# Patient Record
Sex: Male | Born: 1935 | Race: White | Hispanic: No | Marital: Married | Smoking: Never smoker
Health system: Southern US, Community
[De-identification: ages and names within clinical notes are randomized; demographics above are authoritative.]

## PROBLEM LIST (undated history)

## (undated) DIAGNOSIS — C189 Malignant neoplasm of colon, unspecified: Secondary | ICD-10-CM

## (undated) DIAGNOSIS — I2510 Atherosclerotic heart disease of native coronary artery without angina pectoris: Secondary | ICD-10-CM

## (undated) DIAGNOSIS — E785 Hyperlipidemia, unspecified: Secondary | ICD-10-CM

## (undated) DIAGNOSIS — Z9989 Dependence on other enabling machines and devices: Secondary | ICD-10-CM

## (undated) DIAGNOSIS — G4733 Obstructive sleep apnea (adult) (pediatric): Secondary | ICD-10-CM

## (undated) DIAGNOSIS — Z8679 Personal history of other diseases of the circulatory system: Secondary | ICD-10-CM

## (undated) HISTORY — PX: BLADDER SURGERY: SHX569

## (undated) HISTORY — DX: Malignant neoplasm of colon, unspecified: C18.9

## (undated) HISTORY — DX: Personal history of other diseases of the circulatory system: Z86.79

## (undated) HISTORY — DX: Dependence on other enabling machines and devices: Z99.89

## (undated) HISTORY — PX: PROSTATE SURGERY: SHX751

## (undated) HISTORY — PX: APPENDECTOMY: SHX54

## (undated) HISTORY — DX: Atherosclerotic heart disease of native coronary artery without angina pectoris: I25.10

## (undated) HISTORY — DX: Obstructive sleep apnea (adult) (pediatric): G47.33

## (undated) HISTORY — PX: SUBTOTAL COLECTOMY: SHX855

## (undated) HISTORY — DX: Hyperlipidemia, unspecified: E78.5

---

## 1985-05-29 DIAGNOSIS — C189 Malignant neoplasm of colon, unspecified: Secondary | ICD-10-CM

## 1985-05-29 HISTORY — DX: Malignant neoplasm of colon, unspecified: C18.9

## 1998-09-08 ENCOUNTER — Ambulatory Visit (HOSPITAL_COMMUNITY): Admission: RE | Admit: 1998-09-08 | Discharge: 1998-09-08 | Payer: Self-pay | Admitting: Gastroenterology

## 1998-11-04 ENCOUNTER — Encounter: Payer: Self-pay | Admitting: Surgery

## 1998-11-17 ENCOUNTER — Ambulatory Visit (HOSPITAL_COMMUNITY): Admission: RE | Admit: 1998-11-17 | Discharge: 1998-11-17 | Payer: Self-pay | Admitting: Surgery

## 1998-11-17 ENCOUNTER — Encounter (INDEPENDENT_AMBULATORY_CARE_PROVIDER_SITE_OTHER): Payer: Self-pay | Admitting: Specialist

## 1999-04-07 ENCOUNTER — Encounter: Admission: RE | Admit: 1999-04-07 | Discharge: 1999-05-02 | Payer: Self-pay | Admitting: Orthopedic Surgery

## 1999-11-15 ENCOUNTER — Encounter: Payer: Self-pay | Admitting: Endocrinology

## 1999-11-15 ENCOUNTER — Inpatient Hospital Stay (HOSPITAL_COMMUNITY): Admission: EM | Admit: 1999-11-15 | Discharge: 1999-11-17 | Payer: Self-pay

## 2000-09-27 ENCOUNTER — Encounter: Payer: Self-pay | Admitting: Emergency Medicine

## 2000-09-27 ENCOUNTER — Inpatient Hospital Stay (HOSPITAL_COMMUNITY): Admission: EM | Admit: 2000-09-27 | Discharge: 2000-10-02 | Payer: Self-pay | Admitting: Emergency Medicine

## 2000-09-28 ENCOUNTER — Encounter: Payer: Self-pay | Admitting: Internal Medicine

## 2000-10-01 ENCOUNTER — Encounter: Payer: Self-pay | Admitting: Surgery

## 2002-05-29 HISTORY — PX: KNEE SURGERY: SHX244

## 2004-12-20 ENCOUNTER — Ambulatory Visit: Payer: Self-pay | Admitting: Internal Medicine

## 2005-04-27 ENCOUNTER — Inpatient Hospital Stay (HOSPITAL_COMMUNITY): Admission: EM | Admit: 2005-04-27 | Discharge: 2005-05-03 | Payer: Self-pay | Admitting: Emergency Medicine

## 2005-08-07 HISTORY — PX: CARDIAC CATHETERIZATION: SHX172

## 2005-08-15 ENCOUNTER — Ambulatory Visit (HOSPITAL_COMMUNITY): Admission: RE | Admit: 2005-08-15 | Discharge: 2005-08-16 | Payer: Self-pay | Admitting: Cardiovascular Disease

## 2005-08-15 HISTORY — PX: CORONARY ANGIOPLASTY WITH STENT PLACEMENT: SHX49

## 2005-10-01 ENCOUNTER — Emergency Department (HOSPITAL_COMMUNITY): Admission: EM | Admit: 2005-10-01 | Discharge: 2005-10-01 | Payer: Self-pay | Admitting: Family Medicine

## 2006-04-17 ENCOUNTER — Ambulatory Visit: Payer: Self-pay | Admitting: Internal Medicine

## 2006-05-16 ENCOUNTER — Ambulatory Visit (HOSPITAL_COMMUNITY): Admission: RE | Admit: 2006-05-16 | Discharge: 2006-05-16 | Payer: Self-pay | Admitting: Gastroenterology

## 2006-06-05 ENCOUNTER — Ambulatory Visit: Payer: Self-pay | Admitting: Internal Medicine

## 2006-10-08 IMAGING — CT CT ABDOMEN W/ CM
1 series · 14 of 32 positions shown, 18 images · IV contrast (omnipaque)
Comparison: 04/27/05.
ABDOMEN CT WITH CONTRAST:

CLINICAL DATA: Small bowel obstruction.
TECHNIQUE: Multidetector CT imaging of the abdomen was performed following the standard protocol during bolus administration of intravenous contrast.
Contrast:  125 cc Omnipaque 300.
TECHNIQUE: Multidetector CT imaging of the pelvis was performed following the standard protocol during bolus administration of intravenous contrast.

[Series 2: abd_pel 5.0 b40f st · axial · 0.72mm/px · z∈[-339,-59]mm · 14 of 64 slices shown, 18 images]
[im 5/64  soft-tissue]
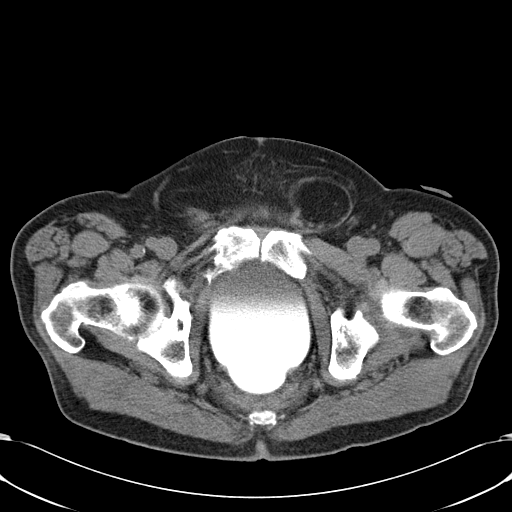
[im 5/64  bone]
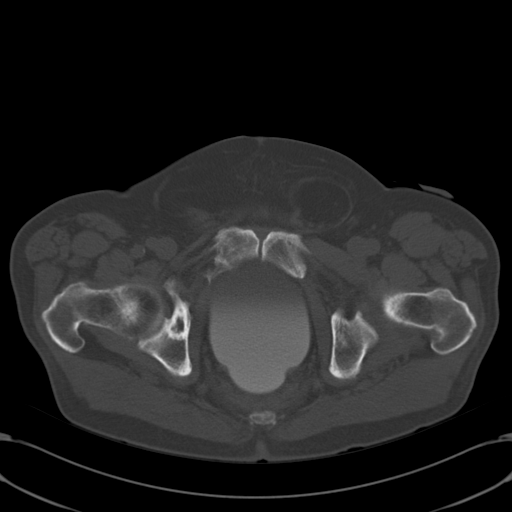
[im 9/64  soft-tissue]
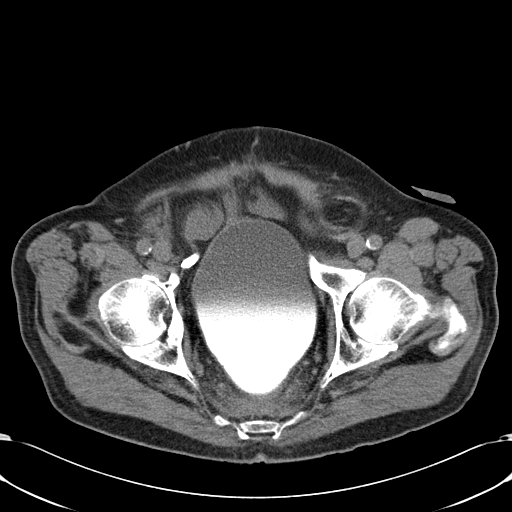
[im 15/64  soft-tissue]
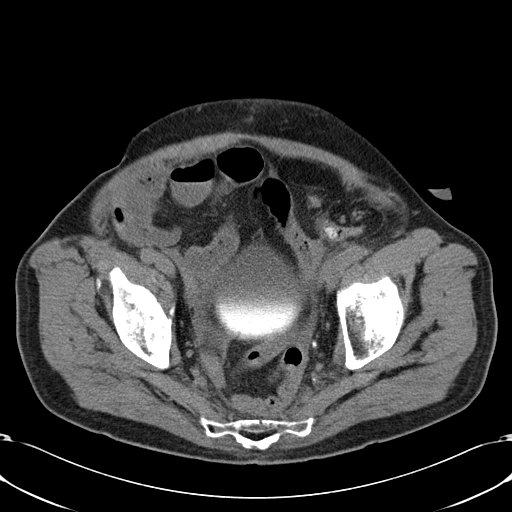
[im 19/64  soft-tissue]
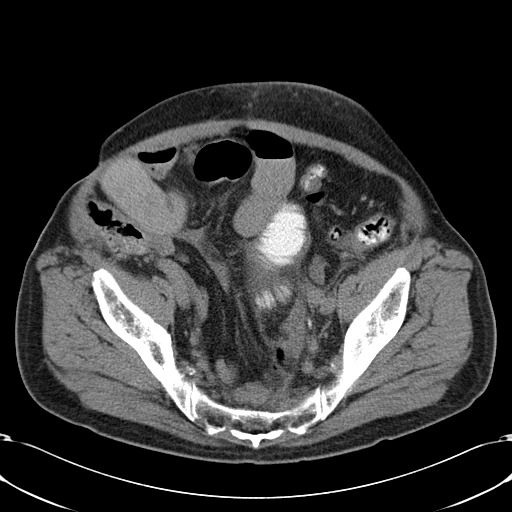
[im 25/64  soft-tissue]
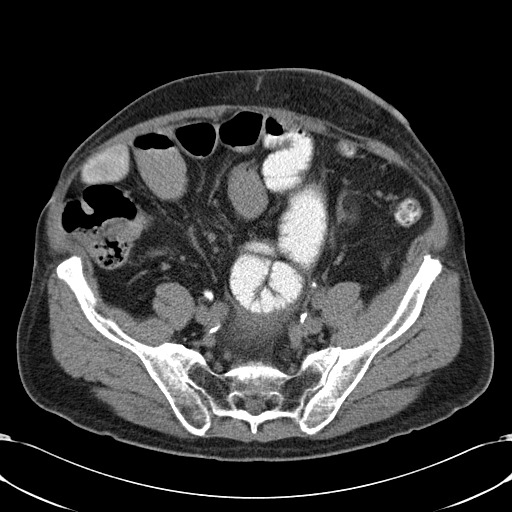
[im 29/64  soft-tissue]
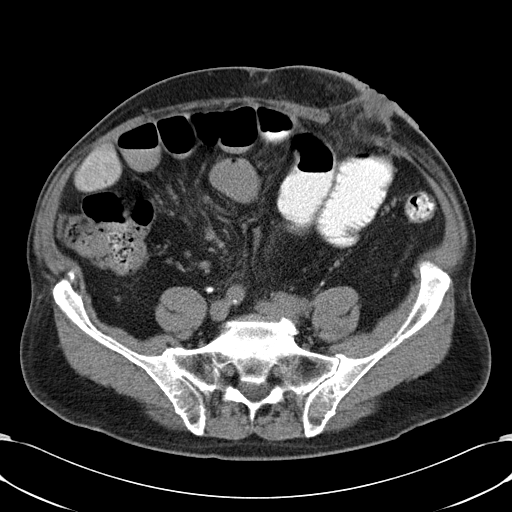
[im 35/64  soft-tissue]
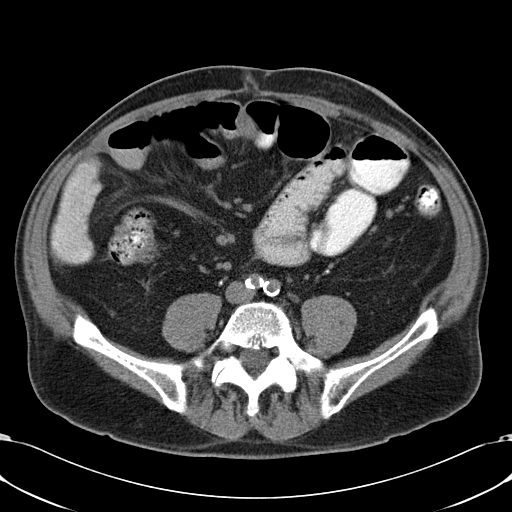
[im 39/64  soft-tissue]
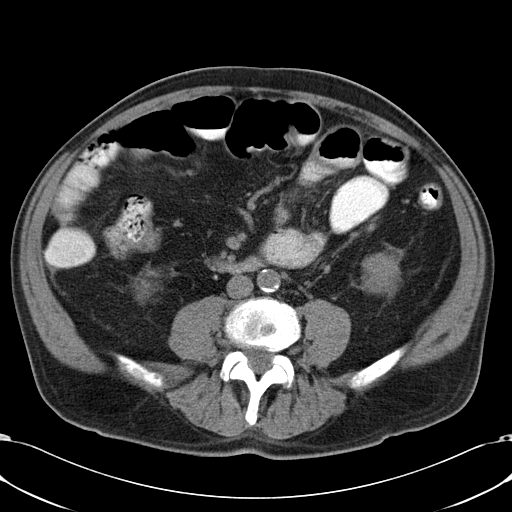
[im 45/64  soft-tissue]
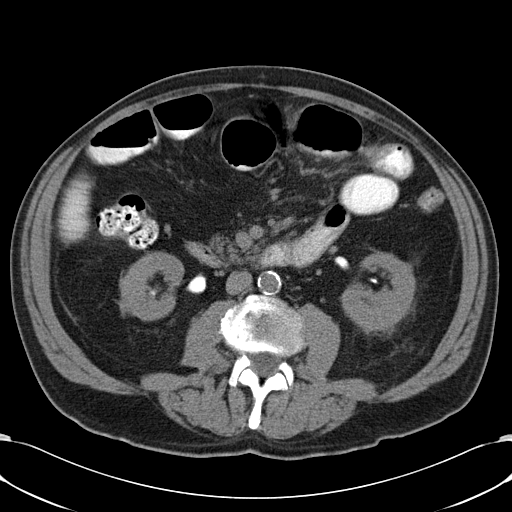
[im 45/64  bone]
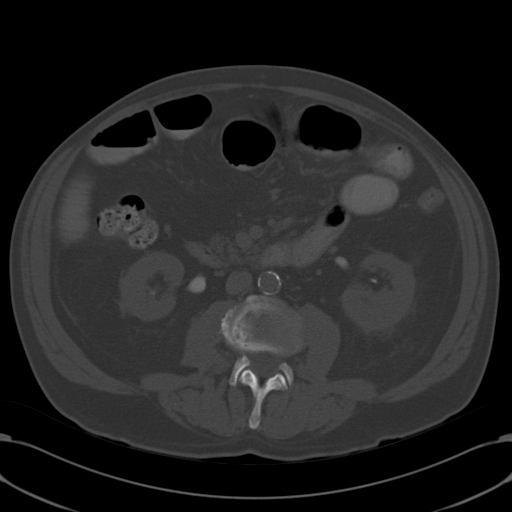
[im 49/64  soft-tissue]
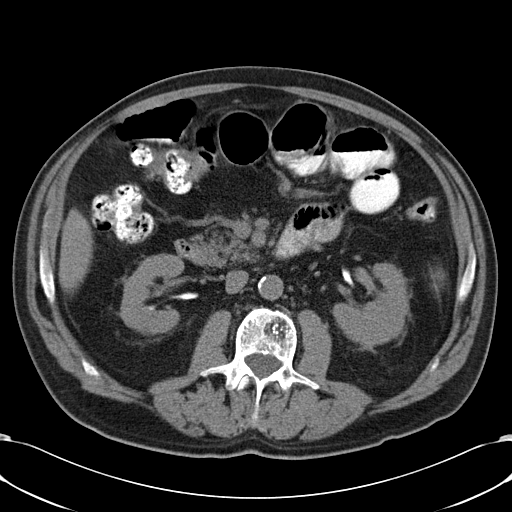
[im 55/64  soft-tissue]
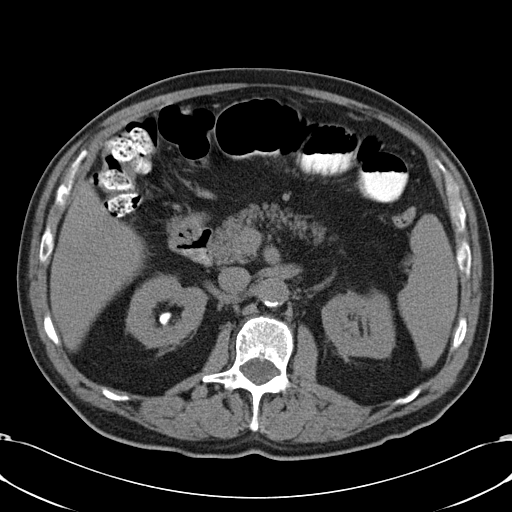
[im 55/64  lung]
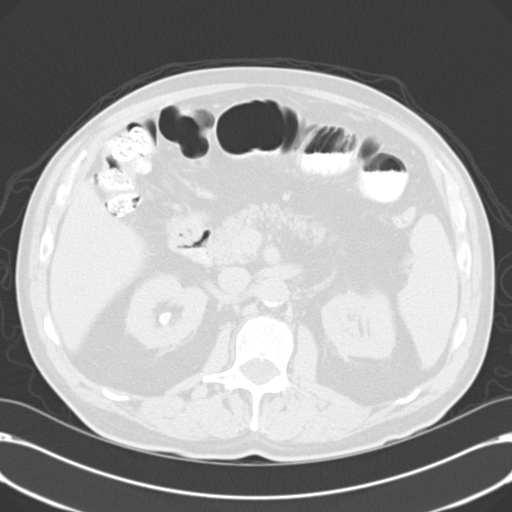
[im 57/64  lung]
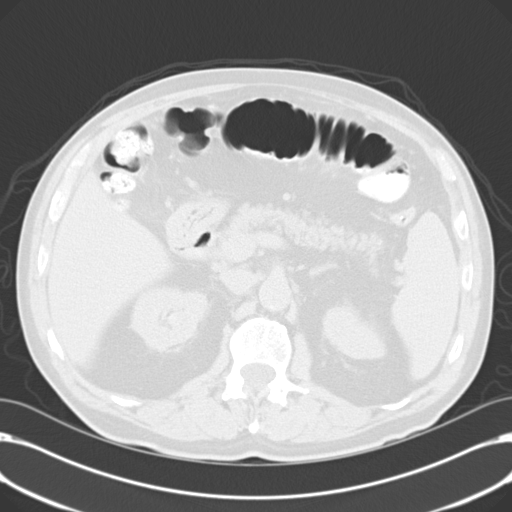
[im 59/64  soft-tissue]
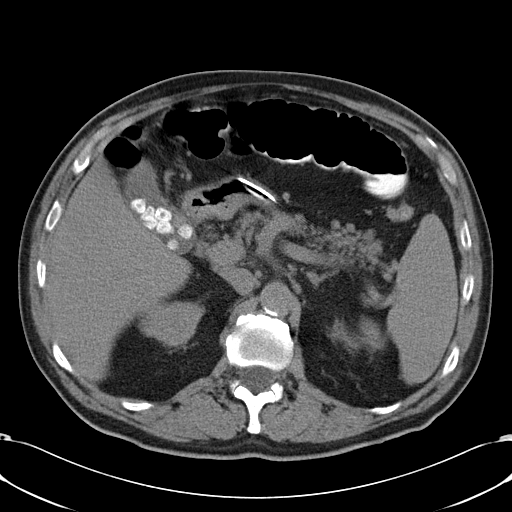
[im 59/64  lung]
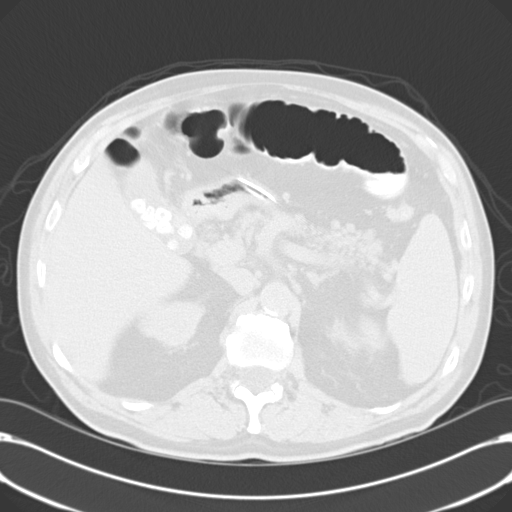
[im 61/64  lung]
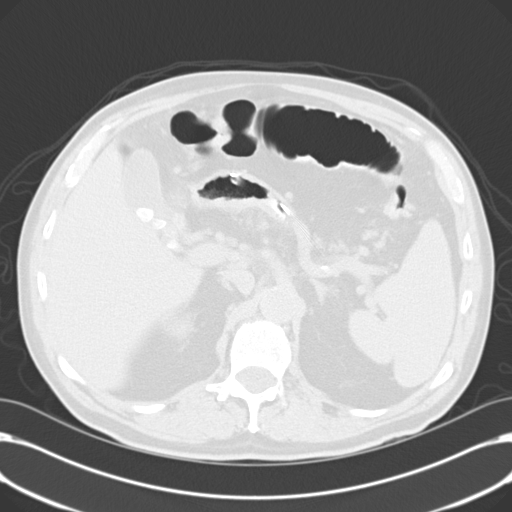

[14 of 32 positions shown; findings below may reference images not displayed]

FINDINGS: No focal abnormality is seen in the liver or spleen.  NG tube is looped in the stomach.  Duodenum, pancreas, and adrenal glands are unremarkable.  Multiple stones are noted in the gallbladder lumen.  Scarring is noted of both kidneys, which are otherwise unremarkable.  No free fluid, lymphadenopathy, or abdominal aortic aneurysm.
Although the duodenum is not distended, proximal jejunal loops are dilated up to 4.4 cm in diameter.
IMPRESSION: Dilated proximal small bowel loops.
PELVIS CT WITH CONTRAST:
FINDINGS: Imaging through the anatomic pelvis reveals interloop mesenteric fluid, with fluid layering dependently in the posterior peritoneal cavity.  The distal ileum shows diffuse wall thickening, and comparing back to the study from 5 days ago, this is the same segment of small bowel which demonstrated small bowel feces sign at that time.  This is an approximately 30 cm loop of small bowel positioned around 20 cm proximal to the ileocecal valve.  No discrete transition zone is identified although there is gradual tapering of the ileum through the involved segment with near complete collapse of the distal and terminal ileum. 
The patient has a sigmoid colostomy and contrast in the distal colon is likely from the CT scan of several days ago.  Left inguinal hernia contains fat as before.  There is deformity of the left superior pubic ramus with chronic features.
IMPRESSION: Jejunal loops are dilated proximally although small bowel becomes less distended towards the distal jejunum and proximal ileum.  There is a 20 to 30 cm segment of distal ileum (approximately 20 cm proximal to the ileocecal valve) which shows mild diffuse wall thickening and gradual tapering towards the non-dilated terminal ileum.  This is the same segment of bowel which demonstrated a small bowel feces sign on the previous study.  Likely, these features reflect an area of enteritis, which may be infectious or inflammatory or even ischemic.  Of note, the celiac axis, SMA, and IMA all opacify normally at their origins.  The affected small bowel segment may be causing a degree of functional obstruction given the dilated proximal jejunal loops.  The patient will be returned to the CT scanner after several hours to assess for transit of oral contrast into and possibly through the inflamed small bowel segment in hopes of definitively excluding a complete obstruction.

## 2006-10-09 IMAGING — CR DG ABDOMEN 2V
2 series · 2 of 2 positions shown · non-contrast
Comparison: CT on 04/30/05.

CLINICAL DATA: Patient had small bowel obstruction.  
 ABDOMEN - 2 VIEW:

[w abdomen upright]
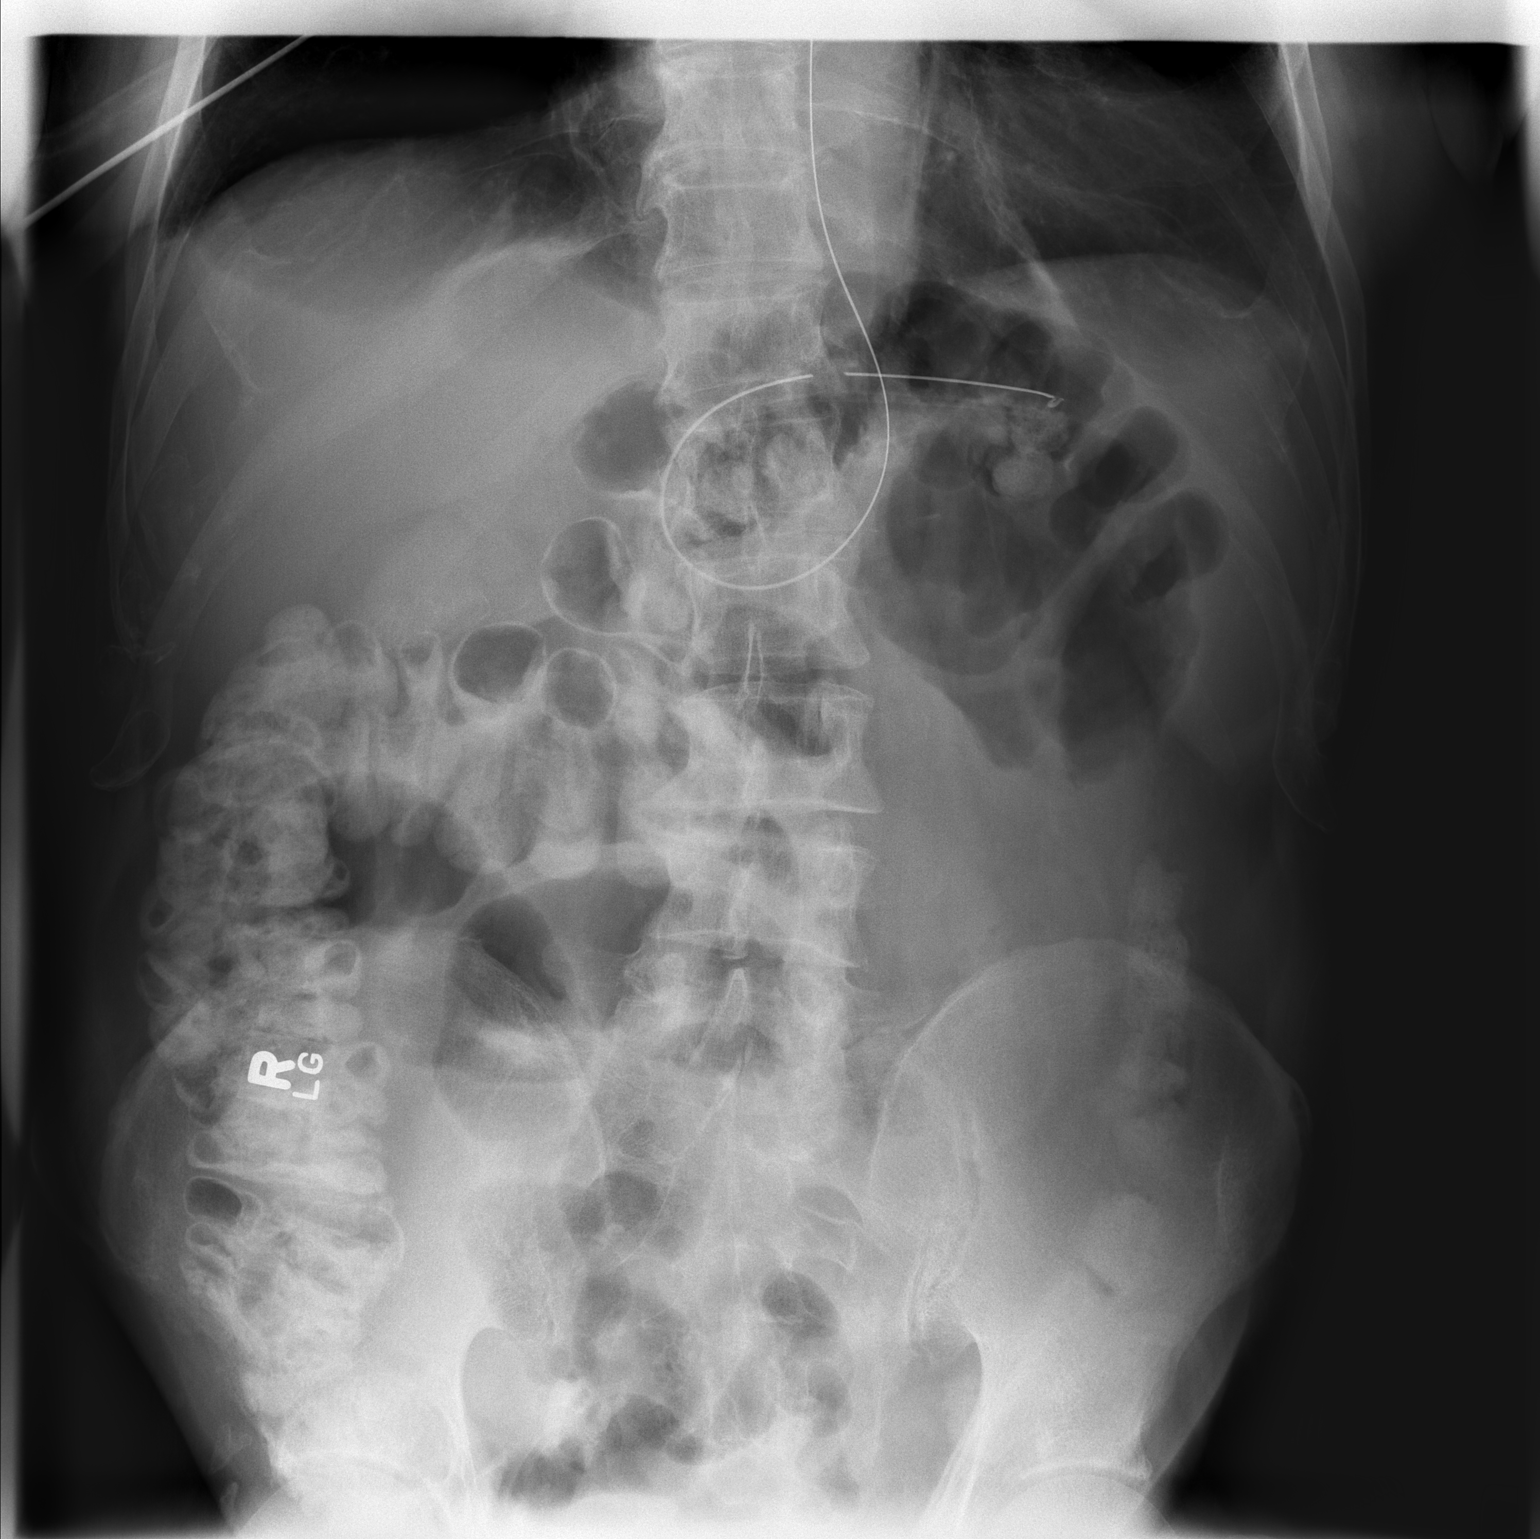

[t abdomen supine]
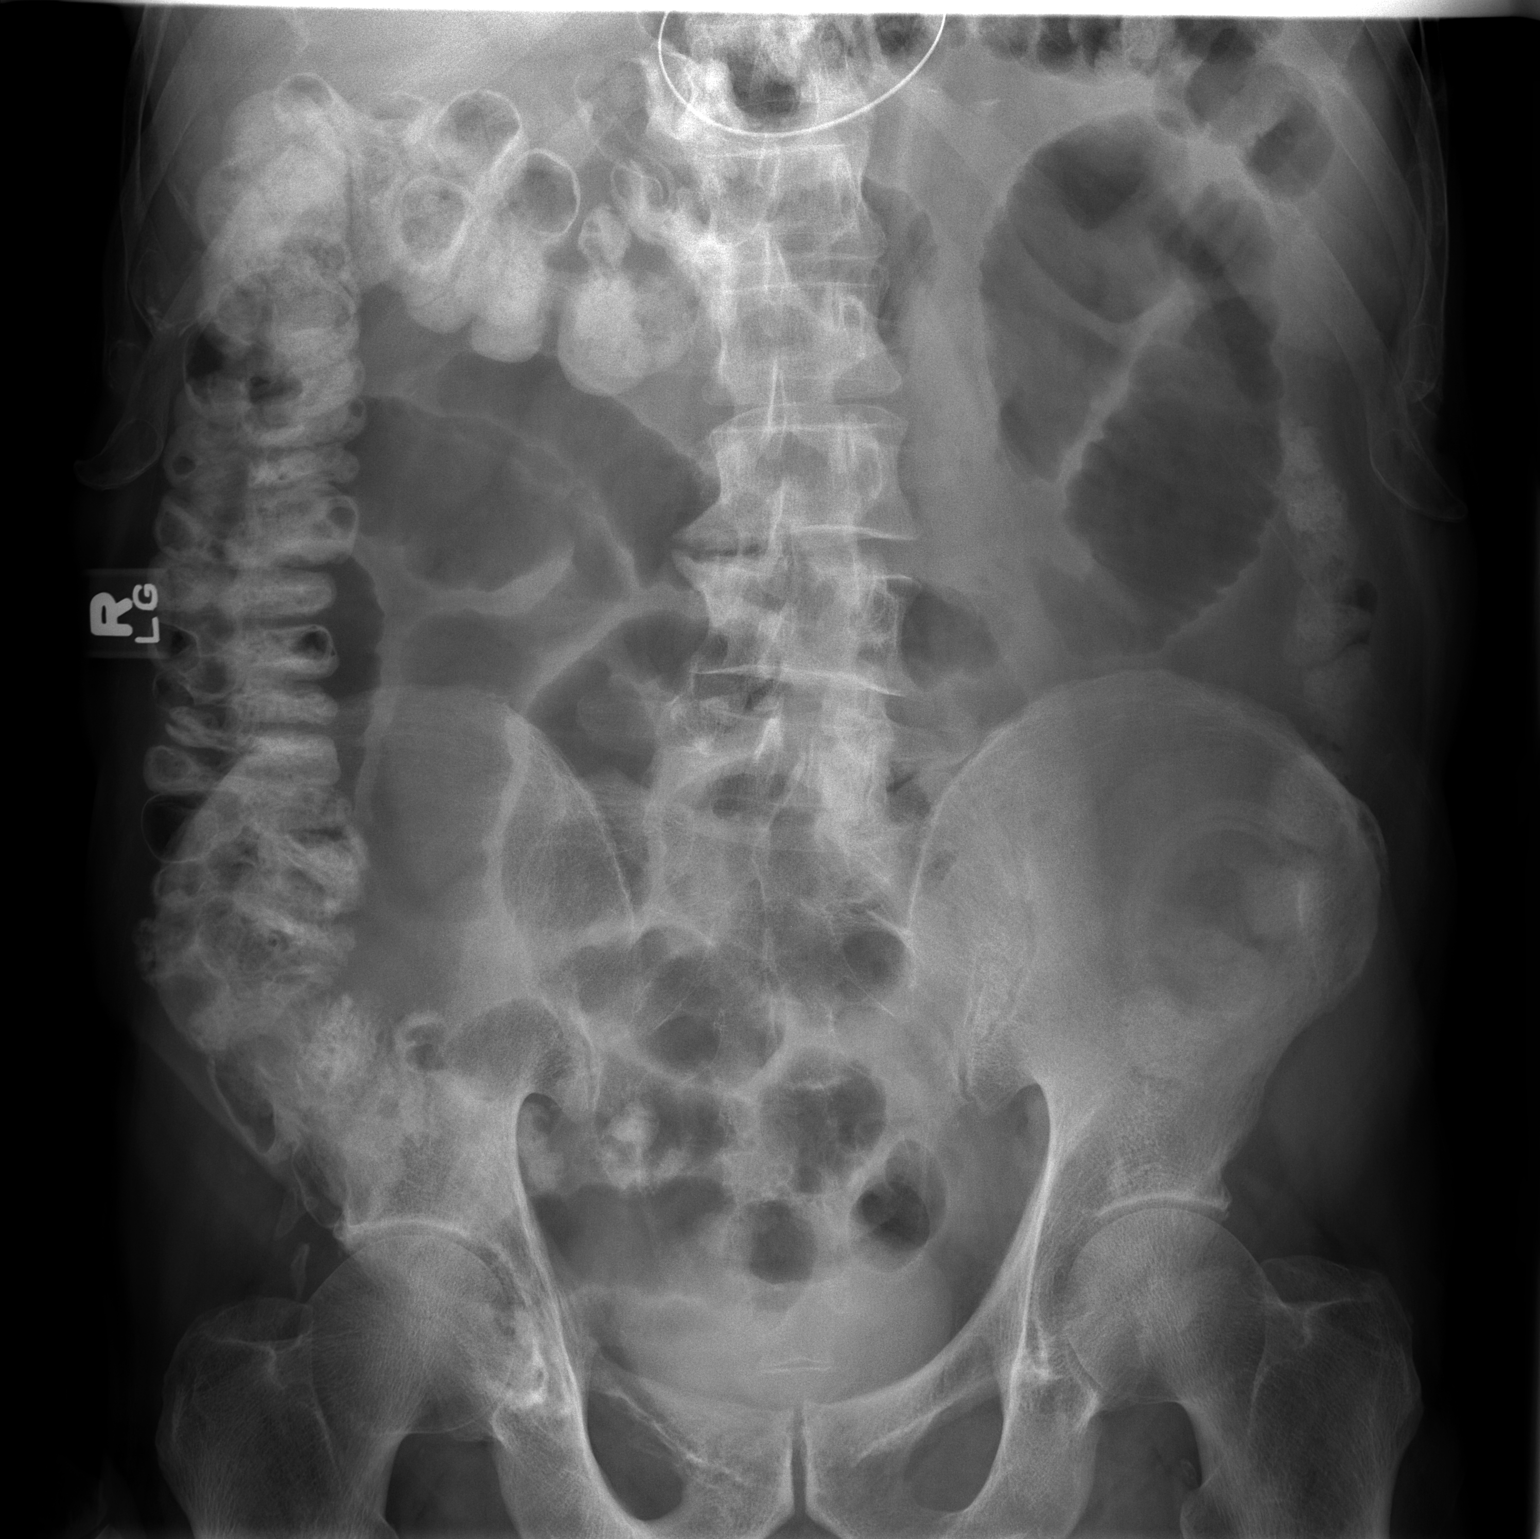

[2 of 2 positions shown; findings below may reference images not displayed]

FINDINGS: Supine and erect views reveal the nasogastric tube to be coiled in the stomach.  There is moderate gaseous dilatation of the small bowel loops.  Contrast is noted in the colon.  Findings are again compatible with partial small bowel obstruction.

## 2007-06-24 ENCOUNTER — Ambulatory Visit: Payer: Self-pay | Admitting: Internal Medicine

## 2007-06-27 ENCOUNTER — Encounter: Payer: Self-pay | Admitting: Internal Medicine

## 2007-06-27 DIAGNOSIS — I2510 Atherosclerotic heart disease of native coronary artery without angina pectoris: Secondary | ICD-10-CM

## 2007-06-27 DIAGNOSIS — Z9861 Coronary angioplasty status: Secondary | ICD-10-CM | POA: Insufficient documentation

## 2007-06-27 DIAGNOSIS — Z85038 Personal history of other malignant neoplasm of large intestine: Secondary | ICD-10-CM | POA: Insufficient documentation

## 2007-06-27 HISTORY — DX: Atherosclerotic heart disease of native coronary artery without angina pectoris: Z98.61

## 2007-06-27 HISTORY — DX: Atherosclerotic heart disease of native coronary artery without angina pectoris: I25.10

## 2007-07-17 ENCOUNTER — Inpatient Hospital Stay (HOSPITAL_COMMUNITY): Admission: EM | Admit: 2007-07-17 | Discharge: 2007-07-23 | Payer: Self-pay | Admitting: Emergency Medicine

## 2007-07-22 ENCOUNTER — Encounter (INDEPENDENT_AMBULATORY_CARE_PROVIDER_SITE_OTHER): Payer: Self-pay | Admitting: Surgery

## 2007-07-22 ENCOUNTER — Ambulatory Visit: Payer: Self-pay | Admitting: Internal Medicine

## 2008-02-05 ENCOUNTER — Ambulatory Visit (HOSPITAL_BASED_OUTPATIENT_CLINIC_OR_DEPARTMENT_OTHER): Admission: RE | Admit: 2008-02-05 | Discharge: 2008-02-05 | Payer: Self-pay | Admitting: Orthopedic Surgery

## 2008-02-26 ENCOUNTER — Encounter: Admission: RE | Admit: 2008-02-26 | Discharge: 2008-03-24 | Payer: Self-pay | Admitting: Orthopedic Surgery

## 2008-12-29 IMAGING — RF DG CHOLANGIOGRAM OPERATIVE
1 series · 9 of 9 positions shown · non-contrast
Comparison: none

CLINICAL DATA: Laparoscopic cholecystectomy. 
 OPERATIVE CHOLANGIOGRAM:

[Series 1: run · 3 acquisitions, 9 frames shown]
[im 1/3]
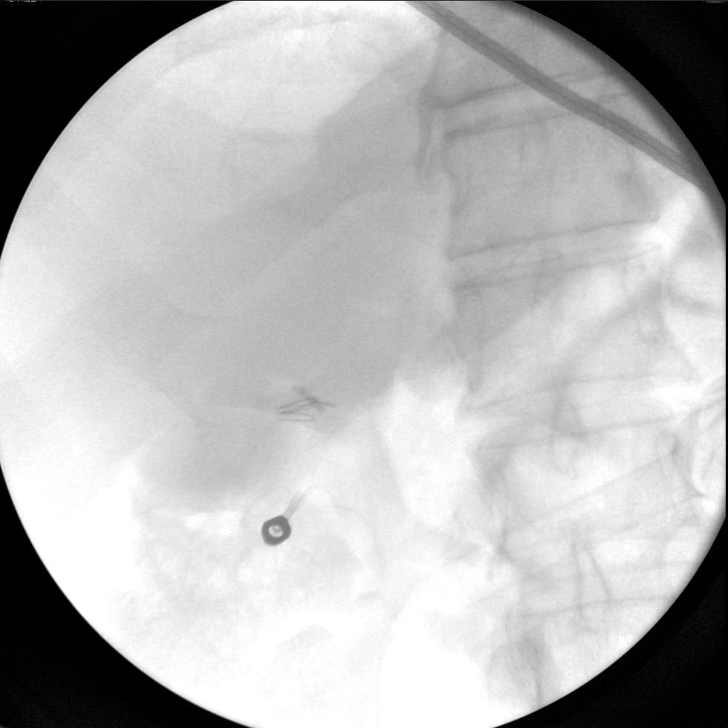
[im 1/3]
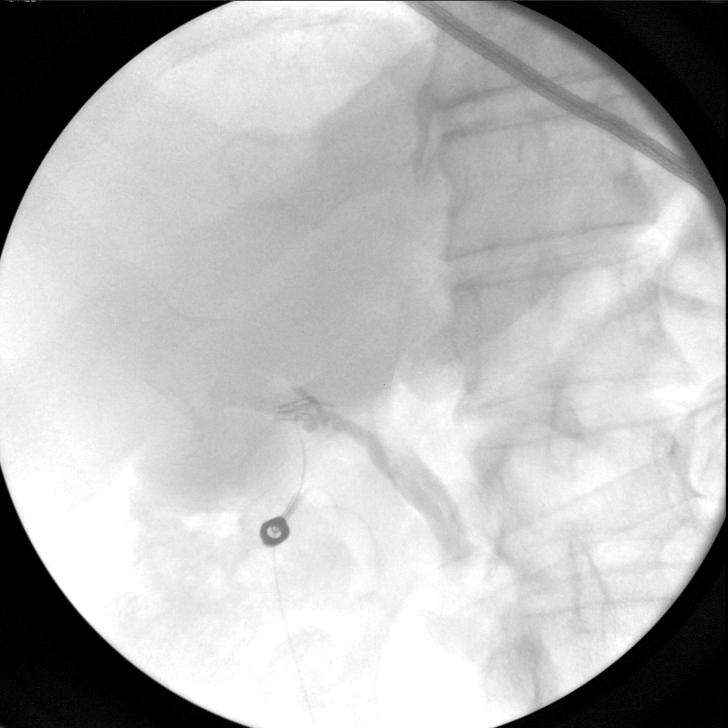
[im 1/3]
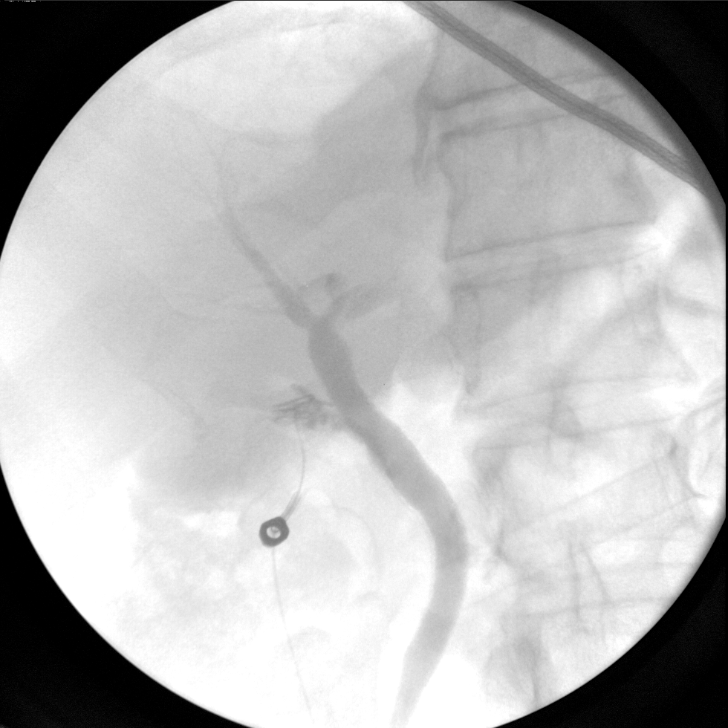
[im 1/3]
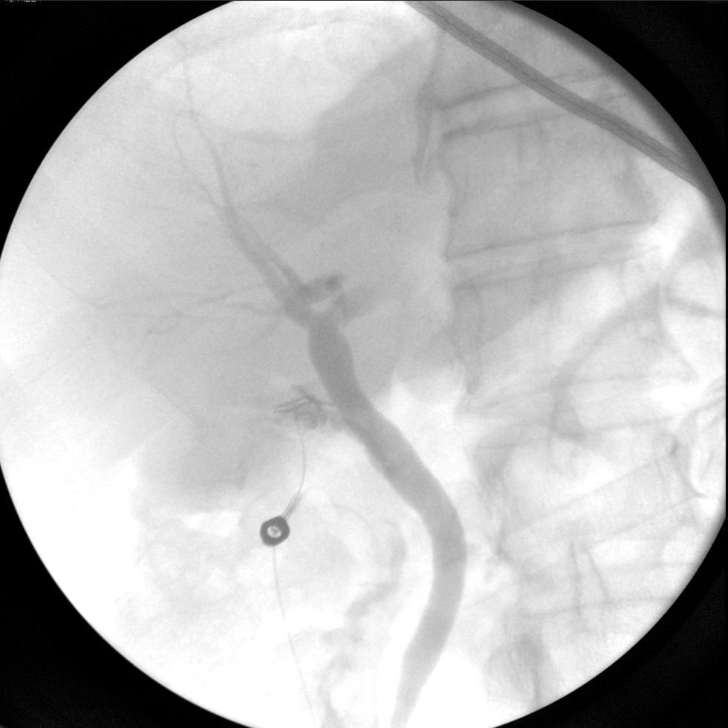
[im 2/3]
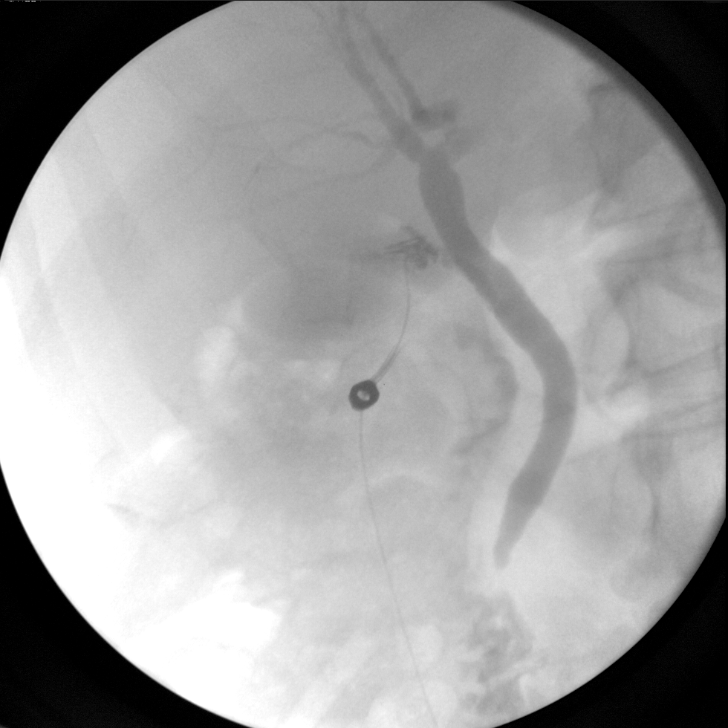
[im 2/3]
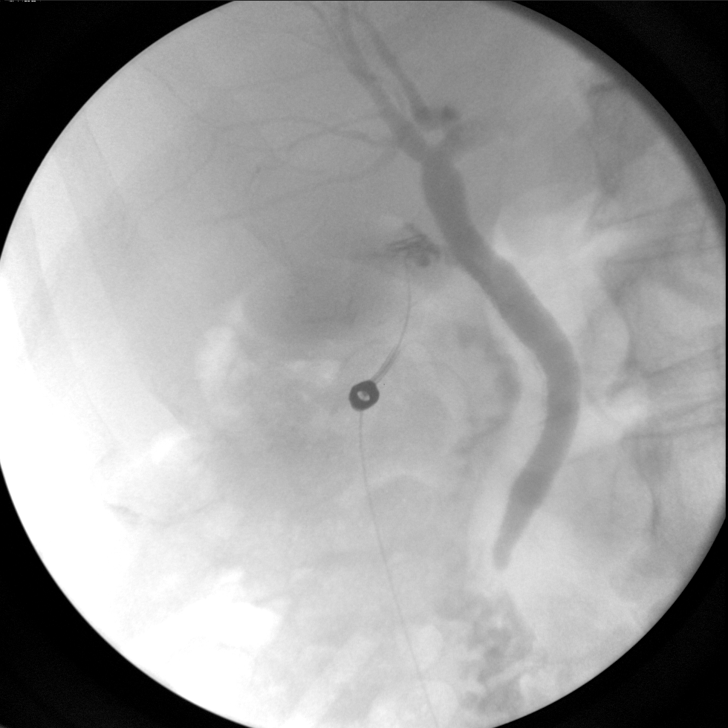
[im 2/3]
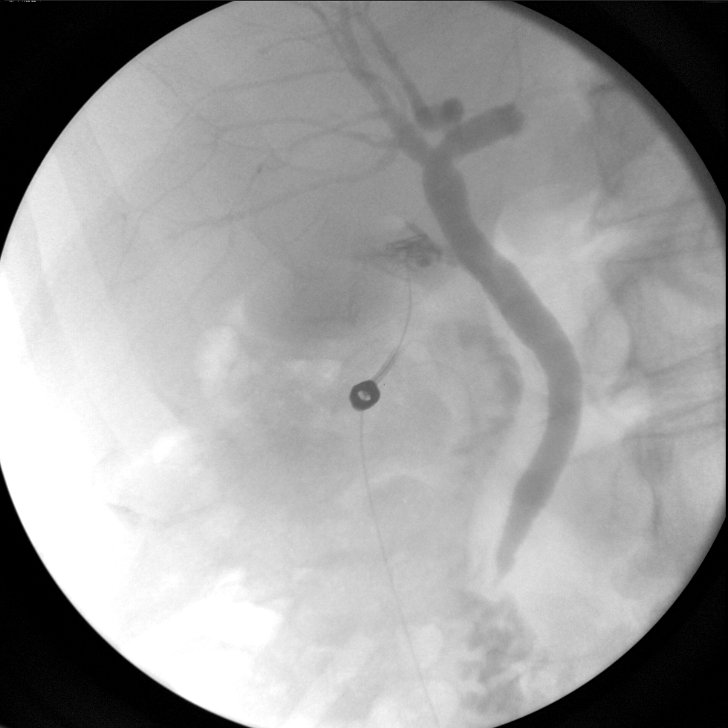
[im 2/3]
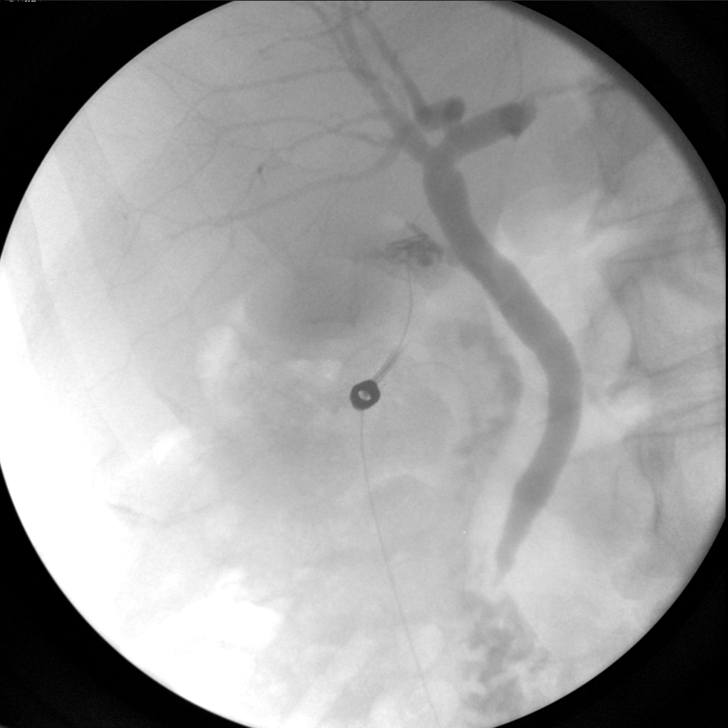
[im 3/3]
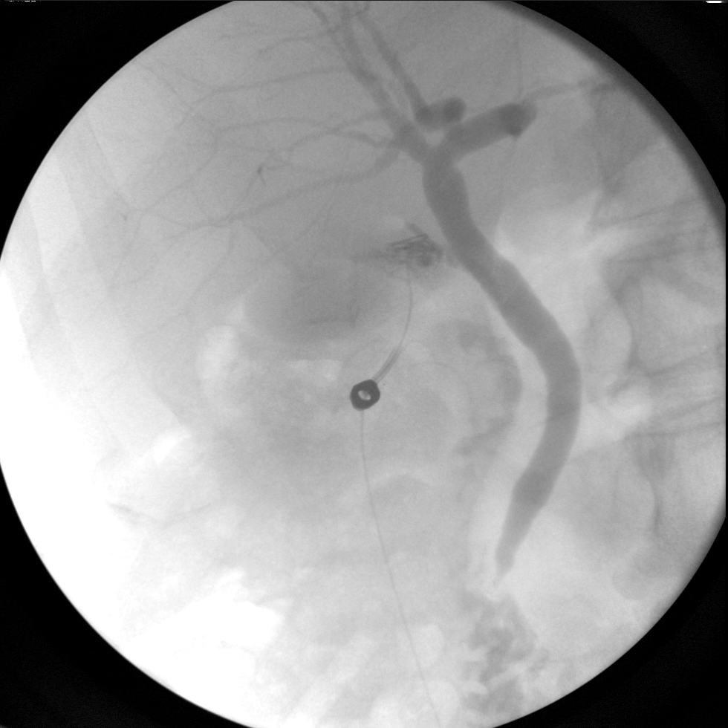

[9 of 9 positions shown; findings below may reference images not displayed]

FINDINGS: C-arm fluoroscopy was provided during operative cholangiogram.  Contrast was injected via the cystic duct.  There is good visualization of the common bile duct.  No filling defects are seen, and there is passage of contrast into the duodenal loop.
IMPRESSION: Negative intraoperative cholangiogram.

## 2010-10-11 NOTE — H&P (Signed)
[name], Anthony           ACCOUNT NO.:  [id number]   MEDICAL RECORD NO.:  [id number]           PATIENT TYPE:   LOCATION:                                 FACILITY:   PHYSICIAN:  Angelia Mould. Derrell Lolling, M.D.DATE OF BIRTH:  November 26, 1927   DATE OF ADMISSION:  07/17/2007  DATE OF DISCHARGE:                              HISTORY & PHYSICAL   PRIMARY CARE PHYSICIAN:  Geoffry Paradise, M.D.   CARDIOLOGIST:  Nicki Guadalajara, M.D.   GASTROENTEROLOGY:  Griffith Citron, M.D.   PULMONOLOGIST:  Rennis Chris. Young, M.D.   CHIEF COMPLAINT:  Right upper quadrant abdominal pain.   HISTORY OF PRESENT ILLNESS:  Anthony Petty is a 77 year old male  patient with a history adenocarcinoma colon diagnosed in 1987,  subsequent total colectomy via APR and colostomy as well as radiation  therapy.  In the interim he has had multiple inpatient admissions for  symptoms related to partial small-bowel obstruction or small-bowel  obstruction that have been treated medically.  He presented to the ER  today after developing continuous right upper quadrant abdominal pain at  2 a.m. last night.  This pain, again has been constant with a waxing and  waning quality, only improved after receiving IV pain medications in the  ER.  This pain has been associated with anorexia and nausea and does  radiate through to the back.  In the ER he was found to have  transaminitis including an elevated total bilirubin greater than 3,  white count in the 11,000 range, and ultrasound of the abdomen  demonstrated stones, gallbladder wall thickening, chronic cholecystitis  and mild ductal dilatation with the common bile duct measuring around  6.1-6.2 mm.  Surgical evaluation for admission has been requested.   REVIEW OF SYSTEMS:  As per the history of present illness.  All review  of systems categories are within normal limits except as follows:  He  had prior episode of similar symptoms about 6-8 months ago, just as  severe but did  not seek medical therapy since the symptoms resolved  quickly and completely.  Regarding this recent attack, he thought  initially that this might be a presentation of small bowel obstruction  although the pain was a different location when he gave himself an enema  via the colostomy, which is his usual treatment for the bowel  obstruction issues.  He actually had worsening upper abdominal pain.  From a cardiac standpoint, he has been stable.  No chest pain, no  shortness of breath, no tachypalpitations, and was last evaluated by Dr.  Tresa Endo 6 months ago.   SOCIAL HISTORY:  He is married.  No tobacco, no alcohol.   FAMILY HISTORY:  Noncontributory to this admission.   ALLERGIES:  No known drug allergies.   CURRENT MEDICATIONS:  1. Saw palmetto tablet daily.  2. Multiple vitamin daily.  3. Aspirin 81 mg daily.  4. Plavix 75 mg daily.  5. Toprol XL 75 mg daily.  6. Imdur.  He uses isosorbide mononitrate 30 mg daily.  7. Vytorin 10/20 mg daily.  8. Fish oil capsule daily   PAST MEDICAL HISTORY:  1. CAD, MI.  Prior stents, most recent in May 2007 to the RCA, [place]      stent.  2. Congestive heart failure with associated diastolic dysfunction.  3. Hypertension.  4. Dyslipidemia.  5. Obstructive sleep apnea on nocturnal CPAP.  6. Prior endocarditis.   PAST SURGICAL HISTORY:  Total colectomy with colostomy via APR 1987 for  adenocarcinoma with subsequent radiation therapy.  This was done in  [place].   PHYSICAL EXAM:  CONSTITUTIONAL/GENERAL:  The patient is a pleasant male,  appears younger than stated age, with complaints of abrupt onset of  right upper quadrant abdominal pain, severe in nature.  VITAL SIGNS:  Temperature 98.6, BP 152/69, pulse 71 and regular,  respirations 20.  PSYCHIATRIC:  The patient is alert and oriented x3, moving all  extremities x4.  His affect is appropriate, not anxious.  Sclerae are noninjected.  Pupils are equal and react to light.  Conjunctivae  are pink.  ENT:  Ears are without lesions.  Otoscopic exam was deferred.  Nares  without rhinorrhea.  Oral mucous membranes pink and moist.  Pharynx  nonerythematous.  NECK:  Supple.  Thyroid is done and nonpalpable.  No masses appreciated.  Trachea is midline.  CHEST:  Bilateral lung sounds are clear to auscultation.  Respiratory  effort is nonlabored.  Chest excursion is symmetrical.  He is on room  air saturating 98%.  CARDIAC:  Heart sounds are S1 and S2 without rubs, murmurs, thrills or  gallops.  No JVD.  Pulses regular.  No peripheral edema.  Pulses are  palpable at 2+ bilaterally, carotid, radial and pedal.  GASTROINTESTINAL:  Abdomen is soft, tender in the right upper quadrant  consistent with a Murphy sign.  Nontender throughout the rest of the  abdomen, and bowel sounds are easily auscultated.  No obvious  hepatosplenomegaly on percussion.  The patient has a midline abdominal  scar extending from umbilicus to the symphysis pubis, which is intact  without hernias.  He does have a stoma in the left lower quadrant that  is without parastomal hernia and pink and appropriate flatus and stool  in the bag.  MUSCULOSKELETAL:  Extremities are symmetrical in appearance without  cyanosis or clubbing.  Skin is without other lesions or rashes.  NEUROLOGIC:  Cranial nerves II-XII are grossly intact.  Sensation is  intact bilaterally, upper and lower extremities.  DTRs were not  assessed.  Babinski's are normal.   LAB:  White count 11,500, hemoglobin 14.6, platelets 169,000,  neutrophils 80%.  Sodium 134, potassium 4.0, CO2 28, glucose 114, BUN  14, creatinine 1.09.  Lipase is 19, total bilirubin 3.6, ALT 134, AST  210, alkaline phosphatase 73.   DIAGNOSTICS:  Ultrasound of the abdomen and pelvis as previously noted  in the history of present illness.  EKG shows sinus bradycardia and is  stable compared to previous EKGs on record.   IMPRESSION:  1. Acute cholecystitis with  cholelithiasis.  2. Probable choledocholithiasis with associated transaminitis.  3. Coronary artery disease, asymptomatic, with prior stent on Plavix      and aspirin.  4. History of adenocarcinoma of the colon with prior abdominal-      perineal resection and radiation therapy.  5. History of congestive heart failure and diastolic dysfunction,      currently compensated.  6. Hypertension.   PLAN:  1. Admit to telemetry floor.  Clear liquid diet and n.p.o. after      midnight in the event additional testing or operative  intervention      needed, IV fluid hydration.  Check labs in the morning, especially      CMET.  If LFTs continue to rise, the patient will probably need GI      consult for possible ERCP.  If LFTs are trending down, possibility      of OR in 4-5 days.  2. Empiric Zosyn for coverage of enteric pathogens.  3. IV morphine for pain, Zofran p.r.n. for nausea and Tylenol for pain      and fever.  4. Continue normal home including the cardiac medications, except we      will hold Plavix and Vytorin and aspirin. I have spoken with Dr.      Clarene Duke in the ER, who was covering for Dr. Landry Dyke practice, and      the patient will be seen later tonight to obtain cardiac clearance      for surgical intervention.  5. Begin DVT prophylaxis with subcutaneous heparin to 8 hours as well      as PAS hose.  Allow the patient to ambulate ad lib. as well.  6. Next Dr. Derrell Lolling has notified Dr. Jacky Kindle of the patient's      admission.      Allison L. Rennis Harding, N.P.      Angelia Mould. Derrell Lolling, M.D.  Electronically Signed    ALE/MEDQ  D:  07/17/2007  T:  07/18/2007  Job:  [id number]   cc:   Nicki Guadalajara, M.D.  Geoffry Paradise, M.D.  Griffith Citron, M.D.  Clinton D. Maple Hudson, MD, FCCP, FACP

## 2010-10-11 NOTE — Op Note (Signed)
Anthony Petty, Anthony Petty           ACCOUNT NO.:  [id number]   MEDICAL RECORD NO.:  [id number]          PATIENT TYPE:  INP   LOCATION:  3743                         FACILITY:  [facility]   PHYSICIAN:  Sandria Bales. Ezzard Standing, M.D.  DATE OF BIRTH:  06/25/1927   DATE OF PROCEDURE:  07/22/2007  DATE OF DISCHARGE:                               OPERATIVE REPORT   Why can't the operative date be typed here?   PREOPERATIVE DIAGNOSIS:  Chronic cholecystitis with cholelithiasis.   POSTOPERATIVE DIAGNOSIS:  Chronic cholecystitis with cholelithiasis.   PROCEDURE:  Laparoscopic cholecystectomy with intraoperative  cholangiogram.   SURGEON:  Sandria Bales. Ezzard Standing, M.D.   FIRST ASSISTANT:  Anselm Pancoast. Zachery Dakins, M.D.   ANESTHESIA:  General endotracheal.   ESTIMATED BLOOD LOSS:  Minimal.   INDICATIONS FOR PROCEDURE:  Mr. Bartoli is a 77 year old white male  who is a patient of Dr. Quillian Quince and was admitted on July 17, 2007  with acute cholecystitis and cholelithiasis.  He has known coronary  artery disease and is on Plavix and aspirins for a stent.  For this  reason, he is he was taken off of his Plavix for about 5 days prior to  surgery.   The indications and potential complications of the procedure were  explained to the patient.  The potential complications include but are  not limited to bleeding, infection, common bile duct injury, and with  his prior abdominal surgery, it is unclear whether I can do this  laparoscopic or open.  He understands the risks of bowel injury also.   OPERATIVE NOTE:  The patient placed in a supine position, given a  general endotracheal anesthetic.  He had a colostomy in the left lower  quadrant.  I removed the colostomy bag and placed a Betadine soaked  sponge inside the colostomy during the procedure and placed a drape over  this to isolate this.  I then prepped his abdomen with Betadine solution  and sterilely draped.  He is already on Zosyn as an antibiotic.  He  had  PAS stockings in place.   His abdomen was prepped with Betadine solution and sterilely draped.  A  timeout was held, identifying the patient and procedure.   I went through an infraumbilical incision with sharp dissection carried  down to the abdominal cavity.  A 0-degree 10-mm laparoscope was passed  through a 12-mm Hasson trocar which was secured with a 0 Vicryl suture  at the umbilicus.  Abdominal exploration carried out laparoscopically.  The right and left lobes of the liver were unremarkable.  The  gallbladder had adhesions to the anterior abdominal wall consistent with  chronic cholecystitis.  Fortunately, he had few adhesions in his right  upper quadrant.  I placed a 10-mm trocar in the subxiphoid location and  a 5-mm right mid subcostal an 5-mm lateral subcostal trocar.  I then  looked back at the port site.  He did have a loop of bowel about 2 cm  above my entrance port, but this small bowel I thought was out of the  field of dissection, and I just left this alone.  He  also had some  adhesions down in his lower abdomen that I left alone also.   The gallbladder was grasped, rotated cephalad.  He did have adhesions of  both omentum and bowel to the anterior wall which I took down with both  blunt and sharp dissection.   I then got down to the cystic duct/gallbladder junction.  It looked like  he had a fairly large stone impacted in his cystic duct.  I milked this  back into the gallbladder.  I then placed a clip across the gallbladder  side of the cystic duct and shot intraoperative cholangiogram.   The intraoperative cholangiogram was shot using a cutoff taut catheter  inserted through a 14-gauge Jelco catheter into the side of the cut  cystic duct, and this was secured with an Endoclip.  I used half-  strength Renografin, and this was shot under fluoroscopy.  I used about  8 mL.  This showed free flow of contrast down the cystic duct into the  common bile duct and the  duodenum up the hepatic radicals.  There was no  filling defect and no masses, and this was felt to be a normal  intraoperative cholangiogram.   The taut catheter was then removed.  The cystic duct triply clipped and  divided.  The cystic artery had already been divided in my dissection.  I identified the triangle of Calot well and thought I was well away from  common bile duct.  I then took the gallbladder out using primarily the  hook Bovie electrocautery off of the liver bed.  After complete division  of the gallbladder from the gallbladder bed, I placed an EndoCatch bag  and delivered it through the umbilicus.  I  then went back and looked at  the gallbladder bed.  There was no bleeding or bile leak either from the  gallbladder bed of the triangle of Calot.  The abdomen was then  irrigated with 500 mL of saline.  The umbilical port closed with 0  Vicryl suture.  The skin at each port closed with 5-0 Vicryl suture and  Monocryl.  Sponge and needle count were correct at the end of the case.   The patient tolerated the procedure well and was transported to the  recovery room in good condition.      Sandria Bales. Ezzard Standing, M.D.  Electronically Signed     DHN/MEDQ  D:  07/22/2007  T:  07/22/2007  Job:  [id number]   cc:   Geoffry Paradise, M.D.  Nicki Guadalajara, M.D.  Griffith Citron, M.D.  Clinton D. Maple Hudson, MD, FCCP, FACP

## 2010-10-11 NOTE — Discharge Summary (Signed)
NAMEKARO, ROG           ACCOUNT NO.:  [id number]   MEDICAL RECORD NO.:  [id number]          PATIENT TYPE:  INP   LOCATION:  3743                         FACILITY:  [facility]   PHYSICIAN:  Sandria Bales. Ezzard Standing, M.D.  DATE OF BIRTH:  1927/08/19   DATE OF ADMISSION:  07/17/2007  DATE OF DISCHARGE:  07/23/2007                               DISCHARGE SUMMARY   Why can't the days of admission and discharge be typed here?   ADMITTING PHYSICIAN:  Angelia Mould. Derrell Lolling, M.D.   OPERATIVE PHYSICIAN:  Sandria Bales. Ezzard Standing, M.D.   CARDIOLOGIST:  Nicki Guadalajara, M.D.   PRIMARY CARE PHYSICIAN:  Geoffry Paradise, M.D.   DISCHARGING PHYSICIAN:  Sandria Bales. Ezzard Standing, M.D.   CHIEF COMPLAINT:  Mr. Bethel is a 77 year old male patient, who is a  patient of Dr. Quillian Quince, with past surgical history significant for  prior adenocarcinoma of the colon in 1987 and status post subtotal  colectomy via APR with colostomy.  This was done in [place].  In the past  he has had multiple inpatient admissions for dehydration and partial  small-bowel obstruction, and he has may contact with [facility]  surgeons in the past for this but has never had procedures for this.   He presented to the ER on the day of admission with continuous right  upper quadrant pain that began at 2:00 a.m. the night before, constant  with a waxing and waning quality, but never relieved. Improved after  receiving IV medications in the ER.  He has been having anorexia and  nausea with this, and pain was radiating through to the back.  He was  found to have transaminitis with a total bilirubin greater than 3 and  white count 11,000.  Ultrasound was performed that demonstrated  cholelithiasis, gallbladder wall thickening, chronic cholecystitis with  mild ductal dilatation with the common bile duct measuring 6-6.2 mm.  The patient was subsequently evaluated by surgery for admission.   On exam, the patient's vital signs were stable.  He was  afebrile.  His  abdomen was soft, tender in the right upper quadrant consistent with a  positive Murphy sign.  Midline scar without hernias from previous  surgery, and he has left lower quadrant colostomy stoma that is pink  with flatus and stool in the bag.   White count 11,500, hemoglobin 14.6, platelets 169,000, neutrophils 80%.  Sodium 134, BUN 14, creatinine 1.09, lipase 19, total bilirubin 3.6, ALT  134, AST 210, alkaline phosphatase 73. Ultrasound as mentioned.   The patient was admitted by Dr. Derrell Lolling with the following diagnoses.  1. Acute on chronic cholecystitis and cholelithiasis.  2. Choledocholithiasis and transaminitis.  3. Coronary artery disease, asymptomatic, currently on Plavix and      aspirin.  4. Previous adenocarcinoma of the colon with previous abdominoperineal      resection and colostomy and previous radiation treatment.  5. History of congestive heart failure, currently compensated, known      diastolic dysfunction.  6. Hypertension.   HOSPITAL COURSE:  The patient was admitted to telemetry floor where he  was placed on clear liquid diet with  n.p.o. after midnight and again  additional testing for operative intervention needed and started on IV  fluid hydration. Most importantly, we were following his hepatic  function studies.  If total bilirubin continues to significantly  increase, the patient may need GI consult for possible ERCP.   In addition, he was placed on empiric Zosyn for coverage of enteric  pathogen. Dr. Tresa Endo, with cardiology, was notified to obtain cardiac  clearance for the patient, and Plavix was placed on hold in anticipation  of proceeding with surgical intervention in 4-5 days if otherwise ready  for surgery.  In addition, the patient was started on DVT prophylaxis  with subcutaneous heparin q.8 h as well as PAS hose.   Over the next several days, the patient remained stable.  His total  bilirubin did peak at 6.5.  Gastroenterology  evaluated the patient but,  since the patient was otherwise clinically stable and nonicteric and the  pain was improving, white count was better, they felt , given the  patient being on Plavix, would continue to monitor and, if indicated,  later would perform ERCP.   [facility], Dr. Tresa Endo, also evaluated the patient and felt  that the patient need to undergo additional cardiac studies before  proceeding with surgical intervention.  He underwent Persantine  perfusion study on July 19, 2007.  This revealed no ischemia, normal  wall motion with an EF of 65%, and he was otherwise deemed appropriate  for surgical procedure.  During the same time, the patient was having  some difficulty with bradycardia, and his metoprolol dose was cut in  half from his baseline dosage.   From a surgical standpoint, the patient's pain was well controlled.  He  was tolerating clear liquids and low-fat full liquid diet without any  pain.  His transaminases were trending downward nicely.  At this point,  we were waiting for the Plavix to wash out so we could proceed with  surgical intervention.   On July 21, 2007, Dr. Ezzard Standing assumed care of the patient with plans  to proceed with surgery if no other indications not to. On that day, the  patient's vital signs were stable. Hemoglobin was 12, white count 6900.  Creatinine 1.25.  Risks and benefits of the procedure were once again  explained to the patient by Dr. Ezzard Standing, and he agreed to proceed.   The patient subsequently underwent laparoscopic cholecystectomy with  intraoperative cholangiogram on July 22, 2007, by Dr. Ezzard Standing.  The  patient did well.  Intraoperative cholangiogram was negative for any  common bile duct stones or any other obstructive process.  The patient  was sent back to telemetry floor to recover.   On postop day #1, the patient was clinically stable from a cardiac  standpoint. He was tolerating his bradycardia, heart  rates down to 40s.  His beta blockers, as mentioned, had been decreased from 50-25 mg daily.  He was not having any arrhythmias, chest pain, or shortness of breath.  The patient does utilize CPAP for sleep apnea at night.  He has been  switched back on his Plavix, aspirin and Vytorin.  Since his LFTs had  been trending down nicely, most recent LFT was checked on February 22  with a total bilirubin of 2.0.  In regards to surgical issues, his  abdomen was soft.  Bowel sounds were present. Bandages were removed, and  the surgical stab wounds were stable with Steri-Strips and without  drainage or redness.  He was tolerating  a diet and oral pain medications  and was otherwise deemed appropriate for discharge home.   FINAL DISCHARGE DIAGNOSIS:  1. Abdominal pain and biliary colic secondary to chronic cholecystitis      and cholelithiasis.  2. Transient choledocholithiasis with improving liver function tests.  3. History of ischemic coronary artery disease with hypertension and      myocardial infarction on Plavix.  4. Bradycardia, asymptomatic and stable.  5. Sleep apnea.   DISCHARGE MEDICATIONS:  The patient will resume the following home  medications.  1. Aspirin 81 mg daily.  2. Plavix 75 mg daily.  3. Toprol XL 50 has been decreased at 25 mg daily.  4. Imdur 30 mg daily.  5. Vytorin 10/10 daily.  6. Fish oil daily.  7. Multivitamins daily.  8. Saw palmetto daily.  9. Pantothenic acid.  10.CoQ10 daily.  11.Sublingual nitroglycerin p.r.n. chest pain.  12.Tylenol as needed.   New medications include  1. Vicodin 5/325 one to two every 4 hours as needed for pain, #40      dispensed with zero refills.  2. In addition, the patient has been instructed to take Colace 100 mg      two times daily to prevent constipation while taking Vicodin.  3. He is not to be given any NSAIDs, ibuprofen, etc., because he is on      Plavix and aspirin.   Return to work in 2 weeks if you are still  working.   DIET:  Low-sodium, heart-healthy.   WOUND CARE:  Allow Steri-Strips to fall off.  Other activity.   OTHER ACTIVITY INSTRUCTIONS:  Increase activity slowly.  May walk up  steps.  May shower over the next 2 weeks.  No lifting for the next 2  weeks greater than 15 pounds. No driving for 1-2 weeks.   FOLLOWUP APPOINTMENTS:  1. You will need to call Dr. Allene Pyo office at [phone] to be seen in      2 weeks.  2. You are to follow up with Dr. Tresa Endo as previously instructed.      Allison L. Rennis Harding, N.P.      Sandria Bales. Ezzard Standing, M.D.  Electronically Signed    ALE/MEDQ  D:  07/23/2007  T:  07/23/2007  Job:  [id number]   cc:   Nicki Guadalajara, M.D.  Geoffry Paradise, M.D.  Griffith Citron, M.D.  Clinton D. Maple Hudson, MD, FCCP, FACP

## 2010-10-14 NOTE — Consult Note (Signed)
Anthony Petty, Anthony Petty           ACCOUNT NO.:  [id number]   MEDICAL RECORD NO.:  [id number]          PATIENT TYPE:  INP   LOCATION:  1619                         FACILITY:  [facility]   PHYSICIAN:  Lebron Conners, M.D.   DATE OF BIRTH:  1928-01-05   DATE OF CONSULTATION:  04/28/2005  DATE OF DISCHARGE:                                   CONSULTATION   CHIEF COMPLAINT:  Abdominal pain.   PRESENT ILLNESS:  This is a 77 year old white male whose about 20 years  status post abdominoperineal resection for cancer. He had a small bowel  obstruction treated nonoperatively with resolution in 2002. He has had a 2-  day history of decrease in colostomy output, crampy abdominal pain and  vomiting. CT scan demonstrated evidence of small intestinal  obstruction.   FOLLOWUP:  Plain abdominal x-rays showed persistent small intestinal  obstruction this morning. Dr. Jacky Kindle asked me to see the patient to follow  and recommend treatment for this problem. He says he has had a hernia  repaired in the past but has not noticed any bulges or other areas of  discomfort on the abdominal wall. The ostomy has been working fine. He  typically irrigates it daily. He has noticed a decrease in output, however.   PAST MEDICAL HISTORY:  He has hyperlipidemia. Medicines - Lipitor which was  recently discontinued. Baby aspirin and dietary supplements. He had a  history of a bout of bacterial endocarditis in 1980. No other operations  except for removal of a benign tumor of the hip.   ALLERGIES:  CRESTOR and LIPITOR upset his stomach.   FAMILY HISTORY AND CHILDHOOD ILLNESSES:  Unremarkable.   REVIEW OF SYSTEMS:  No chest pain, no shortness of breath, no fever or  cough, denies weight loss, denies other systemic symptoms.   PHYSICAL EXAMINATION:  VITAL SIGNS:  Have been stable as recorded by the  nursing staff. He had a temperature of 100.4 which is now normal since being  in the hospital.  GENERAL:  The patient is  in no acute distress. Mental status is normal.  HEENT:  Head and neck exam are unremarkable with no neck mass, no carotid  bruits, no thyroid enlargement or mass.  CHEST:  Clear to auscultation. There is no chest wall tenderness.  HEART:  Exam was unremarkable with a slightly rapid heart rate and a regular  rhythm. No murmur or gallop heard.  ABDOMEN:  Slightly distended, nontender, no rebound tenderness. Slightly  hyperactive bowel sounds. No mass or hernia is detected. Stoma is healthy in  appearance and there is stool and gas in the colostomy pouch. The patient  has recently irrigated it.  EXTREMITIES:  No edema. Pulses are full. No lesions noted. No skin lesions  noted. Lymph nodes not enlarged in the groin or neck NEUROLOGICAL:  Grossly  normal.   IMPRESSION:  Small intestinal obstruction, probably improving.   RECOMMENDATIONS:  Continuation of nasogastric suction. I adjusted the length  of the nasogastric tube to optimize function. I will check him again  tomorrow and I suggest follow-up labs and x-rays. I believe he most likely  will have resolution of this without operation. Thanks for asking me to see  him.      Lebron Conners, M.D.  Electronically Signed     WB/MEDQ  D:  04/28/2005  T:  04/28/2005  Job:  [id number]   cc:   Geoffry Paradise, M.D.  Fax: [phone]

## 2010-10-14 NOTE — H&P (Signed)
[facility]  Patient:    Anthony Petty, Anthony Petty                    MRN: [id number] Adm. Date:  [id number] Attending:  Beatris Ship                         History and Physical  CHIEF COMPLAINT:  Nausea and vomiting.  HISTORY OF PRESENT ILLNESS:  Mr. Xu is a 77 year old gentleman with history of colon cancer back in the late 1980s and colostomy, as well as history of intermittent small-bowel obstructions, re-presenting at this time with recurrent nausea and vomiting.  He was last hospitalized November 15, 1999 with similar symptoms that abated with conservative management to include IV fluids, bowel rest and decompression.  He additionally has a history of remote subacute bacterial endocarditis x 2 and requires SBE prophylaxis.  He was diagnosed with adenocarcinoma of the colon back in 1987, underwent an abdominoperineal resection and colostomy formation.  He did well until the last three to four years, since which time he has had approximately seven episodes of small-bowel obstruction requiring hospitalization.  His last two included February 2001 at [facility] in [place] and June 2001, requiring hospitalization at [facility].  He has been feeling very well but relates no colostomy output since 2 p.m. yesterday.  He relates the onset of cramping, nausea, vomiting since early morning hours and presented to the emergency room.  Subsequent evaluation clinically and radiographically revealed what appears to be a small-bowel obstruction and he is admitted for further management.  He has had no fever, chills, sweats.  He has had no chest pain or shortness of breath.  ALLERGIES:  Patient has no known drug allergies.  MEDICATIONS:  None except one aspirin daily.  PAST MEDICAL HISTORY:  Medical illnesses include colon cancer in 1987 and subacute bacterial endocarditis x 2.  PAST SURGICAL HISTORY:  Surgical illnesses include left inguinal hernia  by Dr. Sandria Bales. Ezzard Standing, July 2000, as well as benign tumor removed from the right hip in the distant past.  FAMILY HISTORY:  Noncontributory.  SOCIAL HISTORY:  Patient is retired and he has been married for the second time for approximately 15 years.  He has three daughters and grandchildren.  PHYSICAL EXAMINATION:  VITAL SIGNS:  Temperature 97, blood pressure 150/78, pulse 80, respiratory rate 18.  GENERAL:  Patient is pleasant, a bit distressed because of vomiting.  He has an NG tube in place.  SKIN:  Warm, non-diaphoretic, good turgor.  HEENT:  He is anicteric.  Good facial symmetry.  NECK:  No JVD or bruits.  LUNGS:  Clear.  CARDIOVASCULAR:  Regular rate and rhythm, 1/6 systolic ejection murmur.  ABDOMEN:  Minimally distended with NG tube in place, quiet, slight diffuse tenderness.  No rebound or guarding.  BACK:  No CVA or spinal tenderness.  EXTREMITIES:  No cyanosis, clubbing or edema.  Warm distally.  Good pulses.  NEUROLOGIC:  Exam is totally normal.  Higher cortical functioning intact.  He is alert, no lethargy, no lateralizing signs.  ASSESSMENT: 1. Recurrent small-bowel obstruction.  This is most likely secondary to    adhesions. 2. Adenocarcinoma of the colon, status post abdominoperineal resection and    colostomy formation. 3. History of subacute bacterial endocarditis, currently no symptoms or    evidence.  PLAN:  Patient is to be admitted for IV fluids, bowel rest and decompression. Surgical consultation will be obtained as  well. DD:  09/27/00 TD:  09/28/00 Job: [id number]

## 2010-10-14 NOTE — H&P (Signed)
Anthony Petty, Anthony Petty           ACCOUNT NO.:  [id number]   MEDICAL RECORD NO.:  [id number]          PATIENT TYPE:  INP   LOCATION:  1619                         FACILITY:  [facility]   PHYSICIAN:  Gaspar Garbe, M.D.DATE OF BIRTH:  1927-10-22   DATE OF ADMISSION:  04/27/2005  DATE OF DISCHARGE:                                HISTORY & PHYSICAL   CHIEF COMPLAINT:  Partial small-bowel obstruction.   HISTORY OF PRESENT ILLNESS:  The patient is a 77 year old white male with a  history of colonic cancer status post resection and ostomy back in 1987. He  has had a history of small-bowel obstruction last in May 2002 which was  resolved nonsurgically. The patient indicates that over the past couple of  days he had noticed a decrease in his ostomy output and it abruptly stopped  approximately 1 day ago. He began to have nausea and some vomiting at home,  did not seem to be any better by morning with no output overnight, and was  brought by his family to the emergency room for evaluation. Evaluation  included electrolytes which were within normal limits and abdominal flat  plate which showed partial small-bowel obstruction and a considerable amount  of fecal matter which is retained. CAT scan currently pending at the time of  this dictation. The patient indicates that he feels reasonably well after  receiving nausea and pain medicine in the emergency room, is somewhat  groggy, and his family corroborates his above story but denies any fevers or  chills. He indicates that his pain is somewhat better. The pain seemed to be  mostly cramping into the right of his ostomy, not directly at the ostomy  site. He has not tried to irrigate his ostomy, and his last ostomy  irrigation was on Saturday. Of note, his old records indicated that some of  his issues with constipation from prior is thought to be related to not  irrigating his ostomy frequently enough.   ALLERGIES:  The patient notes CRESTOR  and LIPITOR caused him to have GI  upset.   MEDICATIONS:  Lipitor has been discontinued recently. He is taking omega 3  fatty acids, a baby aspirin, saw palmetto, and CoQ10 but is on no other  medications.   PAST MEDICAL HISTORY:  1.  Hyperlipidemia as above.  2.  Colon cancer status post resection in 1987 with no recurrence noted.  3.  LVH.  4.  History of bacterial endocarditis in 1980.  5.  History of partial small-bowel obstruction as noted above in May 2002.   PAST SURGICAL HISTORY:  1.  Colon cancer resection and ostomy in 1987.  2.  Left inguinal hernia.  3.  Right hip biopsy for what was found to be a benign tumor.   SOCIAL HISTORY:  The patient lives in [place] with his second wife. He is  retired, married, with three children. He is a nonsmoker, nondrinker.   FAMILY HISTORY:  Noncontributory.   REVIEW OF SYSTEMS:  The patient notes nausea which has seemingly gotten  better with medication. He is not having any vomiting, is having no ostomy  output as noted above. He notes his abdominal pain has eased with pain  medications. Denies fevers, chills, sweats, headaches, chest pain, shortness  of breath, dyspnea on exertion, or any urinary symptoms.   ADVANCED DIRECTIVES:  The patient is a Full Code.   PHYSICAL EXAMINATION:  VITAL SIGNS:  Temperature 97.8, pulse 86, respiratory  rate 18, blood pressure 140/76.  GENERAL:  No acute distress, somewhat groggy status post Phenergan.  HEENT:  Normocephalic, atraumatic. PERRLA, EOMI. ENT is within normal  limits.  NECK:  Supple. No lymphadenopathy, JVD, or bruit.  HEART:  Regular rate and rhythm. No murmur, rub, or gallop.  LUNGS:  Clear to auscultation bilaterally.  ABDOMEN:  The patient has a left-sided ostomy, the site appearing intact and  clean. He is tender to the right of this with no rebound or guarding. There  are decreased bowel sounds throughout his abdomen.  EXTREMITIES:  No clubbing, cyanosis, or edema.   NEUROLOGIC:  The patient is oriented to person, place, and time but somewhat  groggy. No neurologic deficits are noted.   Abdominal x-ray shows retained fecal material and small-bowel changes  consistent with SBO. CAT scan of his abdomen and pelvis pending at this  time. Labs:  White count 11.0, hemoglobin 15.0, platelets 229. BUN and  creatinine are 15 and 1.0 respectively. He does not have an anion gap. LFTs  show a mildly elevated ALT of 88, alk phos normal at 98, total protein of  7.0, albumin 3.0. Urinalysis shows a specific gravity of 1.024 with negative  leukocyte esterase and negative nitrite.   ASSESSMENT AND PLAN:  1.  Small-bowel obstruction. Will place an NG tube tonight to low suction      and will try irrigation of the ostomy to see if we can get him moving,      and repeat an abdominal film in the morning. If he has not shown any      sign of progression, may consider disimpaction with colonoscope or      sigmoidoscope or surgical or GI consultation. He is 19 years status post      surgery but will do a CEA just to make sure nothing has returned. There      is no obvious sign of tumor or mass on his x-rays. Will follow his CT to      see results.  2.  Hyperlipidemia. The patient is seemingly nontolerant to statins because      of GI upset. This will need to be readdressed by his cardiologist at a      later date.      Gaspar Garbe, M.D.  Electronically Signed     RWT/MEDQ  D:  04/27/2005  T:  04/27/2005  Job:  [id number]   cc:   Geoffry Paradise, M.D.  Fax: [phone]

## 2010-10-14 NOTE — Consult Note (Signed)
[facility]  Patient:    Anthony Petty, Anthony Petty                    MRN: [id number] Proc. Date: 11/15/99 Adm. Date:  [id number] Attending:  Julian Hy CC:         Richard A. Jacky Kindle, M.D.             Griffith Citron, M.D.                          Consultation Report  REASON FOR CONSULTATION:   Dr. Jacky Kindle asked me to see this 77 year old gentleman, known to Dr. Kinnie Scales, in Dr. Aurora Mask absence, because of a small-bowel obstruction.  HISTORY:  Mr. Shear is approximately 13 years status post an abdominal perineal resection for rectosigmoid cancer, without evidence of recurrence (most recent surveillance colonoscopy by Dr. Kinnie Scales in April 2000 was negative for polyps or other pathology).  The patient has a history of recurrent partial small-bowel obstructions which have a fairly stereotyped presentation with the most recent episode prior to the current one occurring approximately four months ago.  The current episode began yesterday afternoon, with fairly sharp, severe low abdominal pain in the infraumbilical area, steady but with periodic exacerbations.  He felt nauseated but did not have vomiting.  These symptoms were very typical for his usual SBO picture so he called in requesting admission based on his prior experience and came to the emergency room about midnight last night, where an abdominal film showed extensive feces in the colon but no obvious dilated small-bowel loops.  The patient was admitted overnight on IV hydration and this morning had three episodes of emesis, one of which was quite large in amount and it sounds as though they were feculent in character.  The patients previous episodes have always resolved spontaneously and have usually been short-lived  The patient does not have any prodromal symptoms leading up to this presentation yesterday to suggest a underlying problem such as a recurrence of his cancer.   Specifically, his appetite has been good, his general health has been good, he has remained vigorously active walking several times a week, no recent changes in his ostomy output, etc.  In fact, he had had a normal good ostomy output earlier yesterday.  The patients pain has essentially resolved at this time and he has the impression that he has turned the corner in the usual fashion with respect to his bowel obstruction.  ALLERGIES:  No known drug allergies.  OUTPATIENT MEDICATIONS:  Daily baby aspirin.  OPERATIONS:  Abdominal perineal resection in [place], [place] about 13 years ago for rectosigmoid cancer and right inguinal hernia repair.  MEDICAL ILLNESSES:  History of subacute bacterial endocarditis x two, most recently about five years ago.  No known coronary disease, diabetes, hypertension.  HABITS:  Nonsmoker, nondrinker.  FAMILY HISTORY:  Negative for GI tract illnesses.  SOCIAL HISTORY:  The patient is married and is from [place], [place] but had dual citizenship here in the [place].  He is a retired Administrator.  REVIEW OF SYSTEMS:  Occasional heart burn symptoms.  Otherwise, per HPI.  PHYSICAL EXAMINATION:  GENERAL:  Unrevealing at this time.  A pleasant, healthy-appearing Caucasian male in no evident distress at the time of my evaluation, having last received pain medication perhaps six hours earlier.  VITAL SIGNS:  Temperature 100.6, blood pressure 132/74, pulse 80, respirations 18 and unlabored.  HEENT:  He is anicteric and without conjunctival pallor. The oropharynx is benign.  There is no cervical or supraclavicular adenopathy or thyromegaly.  CHEST:  Clear anterolaterally.  HEART:  No gallops, rubs, murmurs, clicks, or arrhythmias appreciated at this time.  In particular, no pathologic murmurs appreciated to correlate with his previous history of subacute bacterial endocarditis.  ABDOMEN:  Slightly obese with a colostomy in the left  lower quadrant.  Bowel sounds are sparce, not hyperactive.  No succussion splashes heard.  No guarding, mass, or tenderness are noted.  NEUROLOGICAL EXAM:  The patient is alert, oriented, coherent and appropriate, although slightly somnolent from previous pain medication.  There are no obvious cranial nerve or focal motor deficits.  LABORATORY DATA:  White count 8000, hemoglobin 14.6 with MCV of 79 and a minimally elevated RTW of 14.5, 84 polys, 11 lymphs.  Protime normal. Chemistry profile on admission was within normal limits including liver chemistries and renal function.  IMPRESSION: 1. Abdominal pain, followed by large volume emesis which sounds feculent in    character.  Based on the patients previous history, this seems very    compatible with a partial distal small-bowel obstruction. 2. Remote history of rectosigmoid cancer, status post abdominal perineal    resection, negative surveillance colonoscopy approximately a year ago.  DISCUSSION:  Even though the patients plain abdominal films did not show dilated small-bowel loops, I am still quite comfortable with the diagnosis based on the patients previous clinical presentation in this fashion and the absence of any prodromal symptoms to suggest an alternative smoldering process such as recurrent cancer, inflammatory bowel disease, etc.  The fact that he has a large amount of stool in the colon would still be compatible with the abrupt onset of a small-bowel obstruction as might occur from an adhesion causing a kink in a segment of the intestine.  The patients rapid clinical turnaround is further evidence in favor of this line of thought.  His temperature of 100.6 could imply some sort of infectious process, for example, diverticulitis and the patient has been receiving Unasyn and gentamicin since admission in view of his past history of SBE, so an infectious etiology such as diverticulitis is not out of the question, but  I think is much less likely.   RECOMMENDATIONS:  As long as the patient continues to show such good improvement overnight, I would favor no change in management other than gradual advancement of his diet to clear liquids and finally to a low residue diet.  If his symptoms persist or diagnostic uncertainty remains, however, I would consider a CT scan of the abdomen with oral contrast to look for evidence of obstruction and/or alternative processes such as diverticulitis.  I appreciate the opportunity to have seen this patient in Dr. Aurora Mask absence. DD:  11/15/99 TD:  11/16/99 Job: [id number]

## 2010-10-14 NOTE — Discharge Summary (Signed)
Anthony Petty, Anthony Petty           ACCOUNT NO.:  [id number]   MEDICAL RECORD NO.:  [id number]          PATIENT TYPE:  OIB   LOCATION:  6532                         FACILITY:  [facility]   PHYSICIAN:  Nicki Guadalajara, M.D.     DATE OF BIRTH:  1927-09-27   DATE OF ADMISSION:  08/15/2005  DATE OF DISCHARGE:  08/16/2005                                 DISCHARGE SUMMARY   DISCHARGE DIAGNOSES:  1.  Coronary artery disease, status post percutaneous coronary intervention      with Cypher stent to the right coronary artery, circumflex and the      obtuse marginal #1.  2.  History of congestive heart failure with diastolic dysfunction.  3.  Hyperlipidemia.  4.  Remote history of bacterial endocarditis.   HOSPITAL PROCEDURES:  Cardiac catheterization performed on August 15, 2005.  Angiography revealed a high-grade lesion of the right coronary artery and a  Cypher stent was placed in the proximal portion of the right coronary  artery.  The proximal circumflex had a 100% occlusion and the Cypher stent  was placed into proximal and midportion of the right coronary artery prior  to that occlusion.  Obtuse marginal #1 took off and also had a 100%  occlusion in the proximal portion with left-to-right collaterals from the  left anterior descending artery, and Dr. Tresa Endo placed a Cypher stent into  that obtuse marginal #1 branch with good results.  The patient tolerated the  procedure well.  There were no immediate or delayed complications from this  procedure.   HISTORY OF PRESENT ILLNESS AND HOSPITAL COURSE:  This is a 77 year old  gentleman who was seen by Dr. Tresa Endo in our office with complaints of  exertional shortness of breath and some mild chest discomfort.  He was  scheduled for elective coronary angiography on August 15, 2005, for which he  was admitted to [facility].   Catheterization with intervention was performed on August 15, 2005, for three-  vessel intervention, RCA, circumflex and  obtuse marginal #1 with good  results.   Next morning post intervention Dr. Tresa Endo evaluated the patient.  He was  stable from a cardiac standpoint with vital signs within the normal range.  His potassium was 3.6 and it was replaced before the discharge.  His BUN was  11, creatinine 1.2, glucose 112.  Hemoglobin 13.1, hematocrit 37.7, white  blood cell count 9.6, platelets 204.   The patient was instructed to stay on a low-salt, low-fat, low-cholesterol  diet and no driving for three days as well as no lifting greater than five  pounds for three days also.   DISCHARGE MEDICATIONS:  1.  Aspirin 325 mg daily.  2.  Plavix 75 mg daily.  3.  Imdur 30 mg daily.  4.  Vytorin 10/20 mg daily.  5.  Lopressor 25 mg b.i.d.   Dr. Tresa Endo will see the patient on April 9 at 11:15.      Raymon Mutton, P.A.    ______________________________  Nicki Guadalajara, M.D.    MK/MEDQ  D:  08/16/2005  T:  08/17/2005  Job:  [id number]

## 2010-10-14 NOTE — Assessment & Plan Note (Signed)
[facility]                             PULMONARY OFFICE NOTE   Anthony Petty, Anthony Petty                    MRN:          [id number]  DATE:06/05/2006                            DOB:          02-Sep-1927    PULMONARY/SLEEP MEDICINE FOLLOWUP   PROBLEM LIST:  1. Obstructive sleep apnea.  2. Coronary disease/stent.  3. History of colon cancer.   HISTORY OF PRESENT ILLNESS:  He returns for scheduled followup saying he  is now comfortable with CPAP set at 8 CWP with room air. Overnight  oximetry on CPAP had shown good oxygenation with 100% of the night  saturating over 90%. He was comfortable with the mask and the pressure.  His wife at first thought she was seeing some fluctuating in his depth  of breathing that sounded as if she might be trying to describe Jarrett Soho-  Stokes respiration, but she says that is much less evident now. He is  satisfied to continue as he is doing.   MEDICATIONS:  1. CPAP at 8 CWP through [place].  2. Vitamins.  3. Plavix.  4. Aspirin.  5. Omeprazole.  6. Tylenol PM used occasionally.   MEDICATIONS:  No medication allergy.   OBJECTIVE:  VITAL SIGNS:  Weight 176 pounds, BP 122/82, pulse regular  52, room air saturation 96%.  HEAD AND NECK:  Palate length is 3-4/4 with quality is normal. There is  no stridor or thyromegaly.  GENERAL:  Body habitus is normal.  LUNGS:  Lung fields are clear.   IMPRESSION:  Obstructive sleep apnea now well-controlled on CPAP at 8  CWP.   PLAN:  No changes. We discussed symptoms to watch for and emphasized his  responsibility to drive safely. Schedule return in one year or earlier  p.r.n.     Clinton D. Maple Hudson, MD, Tonny Bollman, FACP  Electronically Signed    CDY/MedQ  DD: 06/05/2006  DT: 06/06/2006  Job #: [id number]   cc:   Geoffry Paradise, M.D.  Nicki Guadalajara, M.D.

## 2010-10-14 NOTE — Discharge Summary (Signed)
Anthony Petty, Anthony Petty           ACCOUNT NO.:  [id number]   MEDICAL RECORD NO.:  [id number]          PATIENT TYPE:  INP   LOCATION:  1619                         FACILITY:  [facility]   PHYSICIAN:  Geoffry Paradise, M.D.  DATE OF BIRTH:  February 11, 1928   DATE OF ADMISSION:  04/27/2005  DATE OF DISCHARGE:  05/03/2005                                 DISCHARGE SUMMARY   DISCHARGE DIAGNOSES:  1.  Nausea and vomiting with dehydration.  2.  Partial small-bowel obstruction secondary to adhesions.  3.  Remote adenocarcinoma of the colon.  4.  Remote subacute bacterial endocarditis.   HISTORY OF PRESENT ILLNESS:  Anthony Petty is a 77 year old gentleman with  intermittent bouts of small bowel obstruction secondary to adhesions,  history of adenocarcinoma of the colon with colostomy, prior history of  multiple episodes of subacute bacterial endocarditis presenting at this time  with onset of nausea, vomiting and abdominal pain. It has been quite some  time since his last bout and he has been fairly diligent with colostomy  irrigation and diet. He relates the fairly sudden onset in the last 12 hours  of nausea, vomiting and diminished ostomy output. He was seen in the  emergency room by my partner, felt to have a recurrent small bowel  obstruction and admitted for further management. For details see the  dictated summary in the chart.   LABORATORY DATA:  CBC, hemoglobin 15, hematocrit 44.3, white blood cell  count is 11, platelet count 229,000. Chemistries, sodium 135, potassium 3.7,  chloride 102, CO2 23, BUN 15, creatinine 1.0, calcium 9.7, protein 7,  albumin 3.6, glucose 127, SGOT 38, SGPT 88, alk phos 98, bilirubin 1.3.   Chest x-ray, no acute cardiopulmonary disease. Abdominal series revealed a  small bowel obstruction. CAT scan of the abdomen, small bowel obstruction.  No mass or abscess.   HOSPITAL COURSE:  The patient was admitted, NG tube placed, patient placed  at bowel rest,  aggressively hydrated and put on empiric antibiotics given  instrumentation of his enteral tract, risk from a bacterial endocarditis  standpoint and empiric coverage. He performed self irrigation of his ostomy  with good success, improved fairly slowly and general surgery was consulted.  With continued bowel rest, he gradually improved and ultimate surgical  intervention was not needed. The NG tube was discontinued, diet advanced and  patient discharged in much improved and stable condition.   DISCHARGE MEDICATIONS:  Resumption of his home medications.   DIET:  Low residue bland diet with plenty of fluids.   Diligence towards ostomy care and irrigation were to continue.          ______________________________  Geoffry Paradise, M.D.    RA/MEDQ  D:  06/09/2005  T:  06/12/2005  Job:  [id number]

## 2010-10-14 NOTE — Assessment & Plan Note (Signed)
[facility]                             PULMONARY OFFICE NOTE   Anthony Petty, Anthony Petty                    MRN:          [id number]  DATE:04/17/2006                            DOB:          1927-07-30    PULMONARY/SLEEP MEDICINE FOLLOWUP   PROBLEM:  1. Obstructive sleep apnea.  2. Coronary disease/stent.  3. History of colon cancer.   HISTORY:  Last year in July 2006, a new CPAP mask fits him better but he  is not sleeping as well.  He is waking about 3 a.m. and waking once or  twice almost every night.  His wife tells him he is not snoring through  his CPAP.  He has had 3 cardiac stents placed.  Using Tylenol PM  chronically and usually repeating a dose at 3 a.m.   MEDICATION:  1. CPAP at 7 CWP through [place].  2. Vitamins.  3. Plavix.  4. Aspirin.  5. Tylenol PM.   No medication allergy.   OBJECTIVE:  Weight 171 pounds.  Bp 128/64.  Pulse regular 45.  Room air  saturation 98%.  Medium body build.  Quiet, clear chest.  His nasal  airway is not obstructed.  Pulse is slow but I hear no murmur.  There is  no edema or cyanosis.   IMPRESSION:  1. Sleep apnea with obstructive and possibly a central apnea      component.  2. Nonspecific insomnia/waking after sleep onset.   PLAN:  1. We discussed sleep hygiene, management of sleep apnea, and options      for the insomnia component.  We would like to be more sure of what      is happening before making major changes.  2. Christoper Allegra is going to try increase his CPAP to 8 CWP, noting that this      was not a tolerated pressure in 2005.  Now that he is used to CPAP      he may do better.  3. We are going to get overnight oximetry on CPAP.  4. Schedule return in 6 weeks, early p.r.n.     Clinton D. Maple Hudson, MD, Tonny Bollman, FACP  Electronically Signed    CDY/MedQ  DD: 04/28/2006  DT: 04/30/2006  Job #: [id number]   cc:   Geoffry Paradise, M.D.  Nicki Guadalajara, M.D.

## 2010-10-14 NOTE — H&P (Signed)
[facility]  Patient:    Anthony Petty, Anthony Petty                    MRN: [id number] Adm. Date:  [id number] Attending:  Julian Hy                         History and Physical  HISTORY OF PRESENT ILLNESS:  [name] is a 77 year old white male with a history of colon cancer resection 13 years ago, with a permanent colostomy.  He has had partial small bowel obstruction approximately six times in this interim, with the last being February 2001, in which he was treated in [place].  He has never  needed surgery for the small bowel obstruction, and each episode has resolved with bowel rest, sometimes with NG suction with IV hydration.  He started with gas this morning, and some distention.  After midday he had cramping waves of abdominal ain with nausea and vomiting, with typical cramping pain.  He still has pain in his  right lower quadrant intermittently at present.  He has very little to eat since midday.  He had a good normal bowel movement this morning into his colostomy. e notes no fevers, chills, or sweats.  He has had no chest pain or shortness of breath.  He felt well yesterday and has had no unusual activity, travel, or exposures.  He has had no urinary symptoms.  He has had no cardiac arrest or symptoms.  He has noted no unusual habits or difficulties.  His GI physician is  Dr. Asencion Partridge. [place].  PAST MEDICAL HISTORY: 1. Colon cancer 13 years ago with an apparent surgical cure. 2. He has had subacute bacterial endocarditis, two episodes. 3. He had a right hip tumor removed.  He has no history of diabetes mellitus, hypertension, heart disease, or other problems.  CURRENT MEDICATIONS:  He has no routine medications.  ALLERGIES:  No known drug allergies.  SOCIAL HISTORY:  He is retired.  He was married for 14 years for the second time. He has three daughters and has grandchildren.  PHYSICAL EXAMINATION:  VITAL SIGNS:  Blood  pressure 180/80, pulse 59, respirations 20, temperature 97.9 degrees.  GENERAL:  We have a healthy youthful-appearing with male, sitting up on the edge of the stretcher, in no distress.  HEENT:  Sclerae anicteric. There is no lid lag or exophthalmos.  No nystagmus present.  Oral mucous membranes are slightly dry, and the pharynx is clear.  NECK:  Supple, without jugular venous distention, bruits, or thyromegaly.  LUNGS:  Clear without wheezes, rales, or rhonchi.  No accessory muscles are used. He is moving air well.  HEART:  Regular and somewhat distant, with no murmurs appreciated.  No valvular  abnormalities could be appreciated.  ABDOMEN:  Minimally distended with slight tenderness in the right lower quadrant. No rebound is present.  Bowel sounds were present, slightly diminished in all quadrants.  EXTREMITIES:  Strong distal pulses, without peripheral edema.  NEUROLOGIC:  He is alert, oriented, and his mentation is clear.  There is no tremor.  In summary, we have a 77 year old white male, with prior gastrointestinal surgery, recurrent small bowel obstruction, now with repeat symptoms that look very much  like his prior symptoms that he has had before.  I suspect he has an early partial small bowel obstruction.  PLAN:  I doubt this will come to surgical intervention being needed.  We are going to  put him at bowel reset with an NG tube if needed.  He will be covered with Unasyn and gentamicin, due to his prior cardiac valvular disease.  He will have  laboratory, including a CMET, a CBC, coagulation studies, and abdominal series. A liver profile will also be checked.  Pain medication will be given if required.  The patient is actually not terribly clinically toxic at the present time, but as the potential for getting quite ill.  His gastroenterologist will be notified of his admission here.DD:  11/15/99 TD:  11/15/99 Job: [id number]

## 2010-10-14 NOTE — Consult Note (Signed)
[facility]  Patient:    Anthony Petty, Anthony Petty                    MRN: [id number] Adm. Date:  [id number] Attending:  Beatris Ship CC:         Tera Mater. Evlyn Kanner, M.D.   Consultation Report  ACCOUNT NUMBER:  [id number]  OFFICE MEDICAL RECORD NUMBER:  [id number]  CHIEF COMPLAINT:  Abdominal pain.  CLINICAL HISTORY:  Mr. Rakestraw is a 77 year old man who has had an APR in approximately 1987 for carcinoma.  He has had several episodes of what have been diagnosed as partial small-bowel obstructions in the interval, the last of which was last summer, hospitalized for that.  About 2 p.m. yesterday, he had his last output via his colostomy.  About 7 p.m., he developed cramping abdominal pain which persisted all night, was associated with lots of vomiting and he presented to the emergency room at [facility] about 5 a.m. this morning.  He has had no output of the ostomy since then, continues to have intermittent pain and now has an NG which has suctioned at least 700 cc, in addition to whatever he had just vomited up. He is being admitted by Dr. Tera Mater. [place] for possible small-bowel obstruction and I was asked to see him for surgical evaluation.  PAST MEDICAL HISTORY:  Past medical history is significant in that he has had a history of bacterial endocarditis x 2.  He has had no other GI or GU problems.  PAST SURGICAL HISTORY:  He did have one other abdominal procedure apparently for removal of some benign tumor.  Prior surgeries include also an inguinal hernia repair by Dr. Sandria Bales. [place] in June of 2000.  ALLERGIES:  He has no known allergies.  PHYSICAL EXAMINATION:  GENERAL:  Patient is a 77 year old generally healthy-appearing male who is uncomfortable with NG tube in place.  He is alert and oriented and in no acute distress but as noted, does appear somewhat uncomfortable.  He has just finished vomiting, despite the NG, but has  settled down at this point.  HEENT:  Head is normocephalic.  Eyes nonicteric.  Pupils are round and regular.  NECK:  Supple.  No masses or thyromegaly.  LUNGS:  Lungs sound clear to auscultation.  HEART:  Regular.  No murmurs, rubs, or gallops are heard.  ABDOMEN:  The abdomen is soft, really not distended.  There is a well-healed low midline scar.  There is a colostomy in the left lower quadrant.  There are no masses or organomegaly noted.  There are a few bowel sounds present.  LABORATORY DATA:  Laboratory studies include a WBC of approximately 10,000. Electrolytes are basically normal, although his glucose is slightly elevated at 121 and bilirubin is slightly elevated at 1.3.  Hemoglobin is 14.9.  I reviewed his x-rays and he has a few dilated loops of small bowel, primarily in the right lower quadrant, and has a large amount of fecal material in the entire colon.  IMPRESSION:  Probable partial small-bowel obstruction but it may well be related to the large quantity of stool in the colon.  PLAN:  The patient is already being admitted, started on IV fluids for rehydration and antibiotics because of his SBE history.  I would add to that some colostomy irrigations to see if we can get the colon moving.  In discussing with him, he did have a recommendation that he start routine colostomy irrigations but has  never followed up with doing that.  DD:  09/27/00 TD:  09/28/00 Job: [id number]

## 2010-10-14 NOTE — Discharge Summary (Signed)
[place]  Patient:    Anthony Petty, Anthony Petty                    MRN: [id number] Adm. Date:  [id number] Disc. Date: 11/17/99 Attending:  Julian Hy                           Discharge Summary  DISCHARGE DIAGNOSES: 1. Partial small-bowel obstruction. 2. 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 3. Remote subacute bacterial endocarditis.  HISTORY OF PRESENT ILLNESS:  Mr. Bonny is a 77 year old white male with partial colectomy 13 years prior and subsequently permanent colostomy presenting at this time with nausea, vomiting, and abdominal pain.  He has had intermittent partial small-bowel obstructions approximately six times in the last 13 years, the last of which being February 2001.  He has typically responded to a combination of bowel rest and a rare NG suction with hydration and once again presents with onset of bloating, abdominal pain, cramping, and typical nausea and vomiting.  In the emergency room, exam and radiographic studies were compatible with a partial small-bowel obstruction, and he was admitted for further management.  For details, see the dictated summary on the chart.  DATA:  Abdominal series revealed feces throughout the colon, otherwise unremarkable.  Chest x-ray: Negative.  Chemistries: Sodium 142, potassium 3.9, chloride 112, CO2 25, BUN 16, creatinine 1.0, glucose 8.7.  CBC: Hemoglobin 14.4, hematocrit 41.1, white blood cell count 10, platelet count 194,000.  HOSPITAL COURSE:  The patient was admitted and placed on IV fluids, bowel rest, and empiric antibiotics.  NG suction was never required.  Within 24 hours, he was much improved, and gradually diet was readvanced such that prior to discharge he was tolerating a regular diet well with no abdominal pain, no nausea or vomiting.  His abdominal exam remained fairly benign throughout the latter part of his hospital course.  GI  and general surgery did consult, both of which felt as though conservative management alone was all that was indicated.  DISPOSITION: The patient is discharged in improved and stable condition.  DISCHARGE MEDICATIONS:  None.  DISCHARGE DIET:  Regular.  ACTIVITY:  He is to resume normal activity. DD:  11/17/99 TD:  11/17/99 Job: [id number]

## 2010-10-14 NOTE — Cardiovascular Report (Signed)
NAMEJAYD, CADIEUX           ACCOUNT NO.:  [id number]   MEDICAL RECORD NO.:  [id number]          PATIENT TYPE:  OIB   LOCATION:  6532                         FACILITY:  [facility]   PHYSICIAN:  Nicki Guadalajara, M.D.     DATE OF BIRTH:  07-21-1927   DATE OF PROCEDURE:  08/15/2005  DATE OF DISCHARGE:  08/16/2005                              CARDIAC CATHETERIZATION   INDICATIONS FOR PROCEDURE:  [name] is a 77 year old gentleman  who has a history of remote bacterial endocarditis in 1980 and again in the  early 1990s. He has mild to moderate concentric LVH with grade I diastolic  dysfunction and normal systolic function. He has a history of  hyperlipidemia. He recently was found to have multivessel CAD by cardiac  catheterization at the [facility] on August 07, 2005. This  revealed normal contractility with an EF of 62% with subtle mid inferior and  mid posterolateral hypocontractility. He had 40-70% narrowing in the LAD in  the region of the diagonal vessel with 30-40% scattered irregularity of the  diagonal vessel. The proximal circumflex was subtotally occluded and there  was collateralization to a large OM vessel from the LAD system with hardly  any collateral visualization of an apparent large dominant circumflex system  distally. The right coronary artery also had 40% proximal narrowing followed  by 90% eccentric stenosis with a 40 to 50% distal narrowing. The patient was  started on Plavix and medical therapy. He now presents for coronary  intervention.   PROCEDURE:  After premedication with Valium 5 mg intravenously, the patient  was prepped and draped in usual fashion. His right femoral artery was  punctured anteriorly and a 6-French sheath was inserted. Attention was first  directed at the right coronary artery. Double bolus Integrilin and weight  adjusted heparinization was administered. With documentation of therapeutic  anticoagulation, a marker wire  was advanced down the RCA via a 6-French  right guide. Primary stenting was performed utilizing a 3 x 23 mm drug-  eluting CYPHER stent.  A 3.25 x 20 mm Quantum balloon was used for post  stent dilatation up to 3.25 mm. Scout angiography confirmed an excellent  angiographic result with no change in the distal 50% narrowing after the PDA  takeoff.   Since intervention to the RCA proceeded uneventfully, the decision was made  to attempt intervention to the totally occluded circumflex vessel today  rather than performing this in a staged setting. A 6-French left XB 3.5  guide was used. A Asahi medium wire was advanced and ultimately was able to  cross the total occlusion. This wire was advanced down to the distal  circumflex. A 2.0 Maverick balloon was then used and dilatation was done in  the proximal and mid circumflex vessel. Attention was then directed at  trying to open up the large marginal vessel which also was occluded at its  origin. The wire was able to traverse into proximal third of this vessel,  but it became very difficult to traverse the wire beyond this. The 2.0  balloon catheter was used in an attempt to allow for  more support to cross  the total occlusion in the proximal third to mid portion of the vessel.  Ultimately, this was crossed, but there is some question as to whether or  not this was truly intraluminal. Dilatation was done at the ostium with the  2.0 balloon. A 2.5 over-the-wire balloon was then inserted and again  multiple dilatations were made in the proximal circumflex and also in the  proximal portion of this marginal vessel.  The Asahi medium wire was then  removed and a Luge wire was advanced down the circumflex marginal. This was  able to traverse further into the vessel, but again, it was never able to be  fully traversed to the distal marginal vessel and there was some concern due  to the vessel tortuosity,  and there was concern as to not to be too  rough  as to induce perforation. Long arduous attempts were made at trying to  navigate to the distal marginal, but this was unsuccessful. Multiple wires  were exchanged and in addition, Whisper wire was also attempted. This was  able to traverse further into the marginal vessel but again was never able  to reach a point of further bifurcation. A 2.5 balloon was used for  dilatation in the AV groove circumflex over the wire in that vessel. At one  point, backup also became difficult and requiring reintroduction of wires  down both vessels. Ultimately it was felt that since there was still good  retrograde flow from the left system into the OM vessel, if we could at  least open up the ostium of the marginal vessel with stenting, this would be  preferable to do prior to stenting the AV groove circumflex. Consequently a  2.5 x 13 mm CYPHER stent was then successfully deployed at the ostium of  marginal vessel and dilated to 16 atmospheres x2, corresponding to  approximately 2.75 mm. Attention was then directed at stenting the AV groove  circumflex. Again, multiple dilatations were made but ultimately a 3 x 28 mm  stent was successfully deployed in the circumflex proximal to and beyond the  takeoff of this first marginal vessel. This was dilated to 16 atmospheres  x2. Post stent dilatation was done with a 3.5 x 20 mm Quantum balloon with  vessel taper ranging from 3.5 to 3.4. The AV groove circumflex was a large  caliber vessel that supplied two additional marginal vessels and ended in  the PLA type vessel. At the completion of the circumflex intervention, there  was TIMI-3 flow in the AV groove circumflex and previous proximal total  occlusion and there now was a widely patent proximal portion of the marginal  vessel and with still excellent collateralization in the more distal to mid  marginal vessel.  HEMODYNAMIC DATA:  Central aortic pressure was 137/63, mean 91.   During the procedure,  the patient did receive several additional doses of  heparin, IC nitroglycerin, additional Plavix at the start of the procedure.   ANGIOGRAPHIC DATA:  The right coronary artery was a moderate size vessel  that had narrowing of 40% in its proximal segment and then followed by 90%  eccentric stenosis. The distal RCA had 50% narrowing beyond the PDA takeoff.  Following successful primary stenting of the proximal to mid RCA, the 40-50  and 90% stenoses were reduced with one stent to 0%. There is TIMI-3 flow.  There was no change in the distal 50% lesion which was not intervened upon.   The left  circumflex coronary artery arose from the left main and was totally  occluded proximally after a takeoff of a diminutive marginal vessel. There  was collateralization of a large marginal vessel beyond this first  diminutive marginal and there was minimal visualization of an apparently  dominant circumflex system from collaterals. Following difficult but  successful coronary intervention to the circumflex vessel, with ultimate  PTCA and stenting of the proximal and mid AV groove circumflex, the 100%  occlusion was reduced to 0% with ultimate intervention with a 20 x 2.5  balloon, stenting with 3 x 28 mm drug-eluting CYPHER stent, and ultimate  post stent dilatation up to 3.5 mm utilizing a 3.5 Quantum balloon, the 100%  stenosis was reduced to 0%. The large marginal vessel was successfully  stented proximally with a 2.5 x 13 mm CYPHER stent. The mid portion of the  vessel remained occluded, but there was excellent collateralization to this  point of mid occlusion left-to-right with TIMI-3 flow.   IMPRESSION:  Successful, but difficult, two vessel coronary intervention  with primary stenting of the right coronary artery with the 50-90% stenoses  proximal and mid being reduced to 0%, and successful percutaneous coronary  intervention of a totally occluded proximal circumflex with 100% occlusion  being  reduced to 0%, with ultimate insertion in the AV groove circumflex of  a 3 x 28 mm drug-eluting stent post dilated 3.5 mm, and percutaneous  transluminal coronary angioplasty stenting of the circumflex marginal vessel  with insertion of a 2.5 x 13 mm drug-eluting CYPHER stent with evidence  still with mid occlusion of the marginal vessel, but with excellent  left anterior descending to circumflex marginal collaterals up to the point  of occlusion and with TIMI-3 flow. Next, double bolus Integrilin/weight  adjusted heparinization/oral Plavix and nitroglycerin administered during  the per procedure.           ______________________________  Nicki Guadalajara, M.D.     TK/MEDQ  D:  08/15/2005  T:  08/16/2005  Job:  [id number]   cc:   Geoffry Paradise, M.D.  Fax: [phone]

## 2010-10-14 NOTE — Discharge Summary (Signed)
[facility]  Patient:    Anthony Petty, Anthony Petty                    MRN: [id number] Adm. Date:  09/27/00 Disc. Date: 10/02/00 Attending:  Gerlene Burdock A. Jacky Kindle, M.D.                           Discharge Summary  DISCHARGE DIAGNOSES: 1. Partial small bowel obstruction. 2. Adenocarcinoma of the colon status post abdominoperineal resection and    colostomy. 3. Remote subacute bacterial endocarditis.  HISTORY OF PRESENT ILLNESS:  Anthony Petty is a 77 year old gentleman with prior colon cancer and resection in the late 1980s resulting in colostomy formation.  He has had a history of intermittent partial small bowel obstructions and represents this time with recurrent nausea and vomiting.  His last hospitalization for such an event was in June 2001, and this responded to fluids, bowel rest, and decompression.  For details, see the dictated history and physical exam Sep 27, 2000.  DATA:  Chemistry:  Sodium 138, potassium 4, chloride 107, CO2 24, BUN 17, creatinine 1.0, calcium 8.9, glucose 125.  CBC:  Hemoglobin was 14.5, hematocrit 42.3, white blood cell count 10.9, platelet count 195,000. Abdominal films revealed retained feces throughout the colon with ileus and questionable partial small bowel obstruction.  HOSPITAL COURSE:  Patient was admitted, placed on IV fluids, NG tube for decompression, and empiric antibiotics.  He did extremely well and eventually improved such that NG tube was discontinued and diet was advanced.  There was also some concern that the precipitating factor may be the large volume of stool throughout the colon and a colostomy irrigation was begun.  He was discharged today in much improved and stable condition.  NG tube has been off for 72 hours.  He tolerated diet yesterday quite well.  Repeat abdominal films revealed no active disease in a nonspecific pattern only with no evidence of obstruction.  Abdominal exam was benign.  Patient is  discharged on a regular diet with colostomy irrigation urged on a weekly basis.  Discharge medications include baby aspirin 81 mg daily.  He is to follow up in the office as needed. DD:  10/02/00 TD:  10/02/00 Job: [id number]

## 2010-12-28 HISTORY — PX: TRANSTHORACIC ECHOCARDIOGRAM: SHX275

## 2011-02-17 LAB — COMPREHENSIVE METABOLIC PANEL
ALT: 132 — ABNORMAL HIGH
ALT: 134 — ABNORMAL HIGH
ALT: 236 — ABNORMAL HIGH
AST: 158 — ABNORMAL HIGH
AST: 210 — ABNORMAL HIGH
AST: 345 — ABNORMAL HIGH
AST: 67 — ABNORMAL HIGH
Albumin: 3.2 — ABNORMAL LOW
Albumin: 3.9
Alkaline Phosphatase: 73
Alkaline Phosphatase: 91
BUN: 11
BUN: 14
BUN: 5 — ABNORMAL LOW
CO2: 24
CO2: 25
CO2: 25
CO2: 27
CO2: 28
Calcium: 8.9
Calcium: 9.1
Calcium: 9.8
Chloride: 100
Chloride: 105
Chloride: 106
Chloride: 108
Creatinine, Ser: 1.09
Creatinine, Ser: 1.18
GFR calc Af Amer: >60
GFR calc Af Amer: >60
GFR calc Af Amer: >60
GFR calc Af Amer: >60
GFR calc non Af Amer: 53 — ABNORMAL LOW
GFR calc non Af Amer: 56 — ABNORMAL LOW
GFR calc non Af Amer: 59 — ABNORMAL LOW
GFR calc non Af Amer: 60 — ABNORMAL LOW
GFR calc non Af Amer: >60
Glucose, Bld: 117 — ABNORMAL HIGH
Glucose, Bld: 119 — ABNORMAL HIGH
Glucose, Bld: 90
Potassium: 4
Potassium: 4
Sodium: 134 — ABNORMAL LOW
Sodium: 137
Sodium: 138
Total Bilirubin: 2 — ABNORMAL HIGH
Total Bilirubin: 2.3 — ABNORMAL HIGH
Total Bilirubin: 3.8 — ABNORMAL HIGH
Total Bilirubin: 6.5 — ABNORMAL HIGH
Total Protein: 6.5
Total Protein: 7.5

## 2011-02-17 LAB — DIFFERENTIAL
Basophils Absolute: 0
Basophils Relative: 0
Eosinophils Absolute: 0
Eosinophils Relative: 0
Lymphocytes Relative: 15
Lymphs Abs: 1.7
Monocytes Absolute: 0.6
Monocytes Relative: 5
Neutro Abs: 9.1 — ABNORMAL HIGH
Neutrophils Relative %: 80 — ABNORMAL HIGH

## 2011-02-17 LAB — CBC
HCT: 40.2
HCT: 43.4
Hemoglobin: 13.5
Hemoglobin: 14.6
MCHC: 33.5
MCHC: 34.5
MCV: 84.7
MCV: 84.9
Platelets: 169
RBC: 4.42
RBC: 4.48
RBC: 4.74
RBC: 5.13
RDW: 14.2
WBC: 11.5 — ABNORMAL HIGH
WBC: 6.9
WBC: 8.1

## 2011-02-17 LAB — BASIC METABOLIC PANEL
BUN: 8
Chloride: 106
Creatinine, Ser: 1.3
Glucose, Bld: 120 — ABNORMAL HIGH

## 2011-02-17 LAB — URINALYSIS, ROUTINE W REFLEX MICROSCOPIC
Glucose, UA: NEGATIVE
Hgb urine dipstick: NEGATIVE
Ketones, ur: NEGATIVE
Nitrite: NEGATIVE
Protein, ur: NEGATIVE
Specific Gravity, Urine: 1.017
Urobilinogen, UA: 2 — ABNORMAL HIGH
pH: 7

## 2011-02-17 LAB — URINE MICROSCOPIC-ADD ON

## 2011-06-09 DIAGNOSIS — N139 Obstructive and reflux uropathy, unspecified: Secondary | ICD-10-CM | POA: Diagnosis not present

## 2011-06-09 DIAGNOSIS — R972 Elevated prostate specific antigen [PSA]: Secondary | ICD-10-CM | POA: Diagnosis not present

## 2011-06-20 DIAGNOSIS — I4891 Unspecified atrial fibrillation: Secondary | ICD-10-CM | POA: Diagnosis not present

## 2011-06-20 DIAGNOSIS — I2510 Atherosclerotic heart disease of native coronary artery without angina pectoris: Secondary | ICD-10-CM | POA: Diagnosis not present

## 2011-10-03 DIAGNOSIS — I330 Acute and subacute infective endocarditis: Secondary | ICD-10-CM | POA: Diagnosis not present

## 2011-10-16 DIAGNOSIS — E785 Hyperlipidemia, unspecified: Secondary | ICD-10-CM | POA: Diagnosis not present

## 2011-10-16 DIAGNOSIS — Z111 Encounter for screening for respiratory tuberculosis: Secondary | ICD-10-CM | POA: Diagnosis not present

## 2011-10-16 DIAGNOSIS — I10 Essential (primary) hypertension: Secondary | ICD-10-CM | POA: Diagnosis not present

## 2012-02-01 DIAGNOSIS — M25579 Pain in unspecified ankle and joints of unspecified foot: Secondary | ICD-10-CM | POA: Diagnosis not present

## 2012-02-01 DIAGNOSIS — M7750 Other enthesopathy of unspecified foot: Secondary | ICD-10-CM | POA: Diagnosis not present

## 2012-02-12 DIAGNOSIS — R079 Chest pain, unspecified: Secondary | ICD-10-CM | POA: Diagnosis not present

## 2012-02-12 DIAGNOSIS — I359 Nonrheumatic aortic valve disorder, unspecified: Secondary | ICD-10-CM | POA: Diagnosis not present

## 2012-02-12 DIAGNOSIS — E782 Mixed hyperlipidemia: Secondary | ICD-10-CM | POA: Diagnosis not present

## 2012-02-12 DIAGNOSIS — I2510 Atherosclerotic heart disease of native coronary artery without angina pectoris: Secondary | ICD-10-CM | POA: Diagnosis not present

## 2012-02-22 DIAGNOSIS — Z79899 Other long term (current) drug therapy: Secondary | ICD-10-CM | POA: Diagnosis not present

## 2012-02-22 DIAGNOSIS — E782 Mixed hyperlipidemia: Secondary | ICD-10-CM | POA: Diagnosis not present

## 2012-02-22 DIAGNOSIS — R5383 Other fatigue: Secondary | ICD-10-CM | POA: Diagnosis not present

## 2012-02-22 DIAGNOSIS — I2510 Atherosclerotic heart disease of native coronary artery without angina pectoris: Secondary | ICD-10-CM | POA: Diagnosis not present

## 2012-02-26 DIAGNOSIS — C44319 Basal cell carcinoma of skin of other parts of face: Secondary | ICD-10-CM | POA: Diagnosis not present

## 2012-02-26 DIAGNOSIS — L820 Inflamed seborrheic keratosis: Secondary | ICD-10-CM | POA: Diagnosis not present

## 2012-02-26 DIAGNOSIS — L80 Vitiligo: Secondary | ICD-10-CM | POA: Diagnosis not present

## 2012-02-27 HISTORY — PX: NM MYOCAR PERF WALL MOTION: HXRAD629

## 2012-03-06 DIAGNOSIS — I10 Essential (primary) hypertension: Secondary | ICD-10-CM | POA: Diagnosis not present

## 2012-03-06 DIAGNOSIS — R079 Chest pain, unspecified: Secondary | ICD-10-CM | POA: Diagnosis not present

## 2012-03-06 DIAGNOSIS — I2510 Atherosclerotic heart disease of native coronary artery without angina pectoris: Secondary | ICD-10-CM | POA: Diagnosis not present

## 2012-03-20 DIAGNOSIS — K089 Disorder of teeth and supporting structures, unspecified: Secondary | ICD-10-CM | POA: Diagnosis not present

## 2012-04-10 DIAGNOSIS — B079 Viral wart, unspecified: Secondary | ICD-10-CM | POA: Diagnosis not present

## 2012-04-10 DIAGNOSIS — Z85828 Personal history of other malignant neoplasm of skin: Secondary | ICD-10-CM | POA: Diagnosis not present

## 2012-04-23 DIAGNOSIS — E782 Mixed hyperlipidemia: Secondary | ICD-10-CM | POA: Diagnosis not present

## 2012-04-23 DIAGNOSIS — I2510 Atherosclerotic heart disease of native coronary artery without angina pectoris: Secondary | ICD-10-CM | POA: Diagnosis not present

## 2012-05-08 DIAGNOSIS — E785 Hyperlipidemia, unspecified: Secondary | ICD-10-CM | POA: Diagnosis not present

## 2012-05-08 DIAGNOSIS — I10 Essential (primary) hypertension: Secondary | ICD-10-CM | POA: Diagnosis not present

## 2012-05-08 DIAGNOSIS — R82998 Other abnormal findings in urine: Secondary | ICD-10-CM | POA: Diagnosis not present

## 2012-05-08 DIAGNOSIS — Z125 Encounter for screening for malignant neoplasm of prostate: Secondary | ICD-10-CM | POA: Diagnosis not present

## 2012-05-17 DIAGNOSIS — Z1212 Encounter for screening for malignant neoplasm of rectum: Secondary | ICD-10-CM | POA: Diagnosis not present

## 2012-05-17 DIAGNOSIS — E785 Hyperlipidemia, unspecified: Secondary | ICD-10-CM | POA: Diagnosis not present

## 2012-05-17 DIAGNOSIS — Z1331 Encounter for screening for depression: Secondary | ICD-10-CM | POA: Diagnosis not present

## 2012-05-17 DIAGNOSIS — Z0000 Encounter for general adult medical examination without abnormal findings: Secondary | ICD-10-CM | POA: Diagnosis not present

## 2012-05-17 DIAGNOSIS — I10 Essential (primary) hypertension: Secondary | ICD-10-CM | POA: Diagnosis not present

## 2012-05-27 DIAGNOSIS — R05 Cough: Secondary | ICD-10-CM | POA: Diagnosis not present

## 2012-05-27 DIAGNOSIS — J069 Acute upper respiratory infection, unspecified: Secondary | ICD-10-CM | POA: Diagnosis not present

## 2012-07-08 DIAGNOSIS — H6120 Impacted cerumen, unspecified ear: Secondary | ICD-10-CM | POA: Diagnosis not present

## 2012-07-08 DIAGNOSIS — H903 Sensorineural hearing loss, bilateral: Secondary | ICD-10-CM | POA: Diagnosis not present

## 2012-07-17 DIAGNOSIS — D485 Neoplasm of uncertain behavior of skin: Secondary | ICD-10-CM | POA: Diagnosis not present

## 2012-07-17 DIAGNOSIS — L259 Unspecified contact dermatitis, unspecified cause: Secondary | ICD-10-CM | POA: Diagnosis not present

## 2012-10-02 DIAGNOSIS — K089 Disorder of teeth and supporting structures, unspecified: Secondary | ICD-10-CM | POA: Diagnosis not present

## 2012-10-14 DIAGNOSIS — N390 Urinary tract infection, site not specified: Secondary | ICD-10-CM | POA: Diagnosis not present

## 2012-10-14 DIAGNOSIS — R3915 Urgency of urination: Secondary | ICD-10-CM | POA: Diagnosis not present

## 2012-12-06 DIAGNOSIS — G4730 Sleep apnea, unspecified: Secondary | ICD-10-CM | POA: Diagnosis not present

## 2012-12-06 DIAGNOSIS — I2510 Atherosclerotic heart disease of native coronary artery without angina pectoris: Secondary | ICD-10-CM | POA: Diagnosis not present

## 2012-12-06 DIAGNOSIS — Z7901 Long term (current) use of anticoagulants: Secondary | ICD-10-CM | POA: Diagnosis not present

## 2012-12-06 DIAGNOSIS — R942 Abnormal results of pulmonary function studies: Secondary | ICD-10-CM | POA: Diagnosis not present

## 2012-12-20 ENCOUNTER — Ambulatory Visit: Payer: Self-pay | Admitting: Cardiovascular Disease

## 2012-12-25 ENCOUNTER — Telehealth: Payer: Self-pay | Admitting: Cardiovascular Disease

## 2012-12-25 MED ORDER — ISOSORBIDE MONONITRATE ER 30 MG PO TB24: 30.0000 mg | ORAL_TABLET | Freq: Every day | ORAL | Status: DC

## 2012-12-25 NOTE — Telephone Encounter (Signed)
Returned call and spoke w/ Anthony Petty.  Stated pt is switching from mail order to their pharmacy and Rx cannot be transferred.  No Rx for isosorbide on file at pharmacy and needs a new Rx.  Informed chart will be reviewed and Rx will be sent.  Verbalized understanding.  Paper chart received and Refill(s) sent to pharmacy [facility]).

## 2012-12-25 NOTE — Telephone Encounter (Signed)
Need a new prescription sent to them on Isosorbide 30mg  .. Patient is switching from mail order to his local pharmacy    Thanks

## 2012-12-30 ENCOUNTER — Institutional Professional Consult (permissible substitution): Payer: Self-pay | Admitting: Internal Medicine

## 2013-01-03 ENCOUNTER — Telehealth: Payer: Self-pay | Admitting: Cardiovascular Disease

## 2013-01-03 MED ORDER — ISOSORBIDE MONONITRATE ER 30 MG PO TB24: 30.0000 mg | ORAL_TABLET | Freq: Every day | ORAL | Status: DC

## 2013-01-03 NOTE — Telephone Encounter (Signed)
Returned call.  Pt informed refill will be sent for #90 w/ 1 refill and recall will be placed for a November appt.  Pt verbalized understanding and agreed w/ plan.  Refill(s) sent to pharmacy.

## 2013-01-03 NOTE — Telephone Encounter (Signed)
Please call-he needs his Isosorbide for 90 days instead of 30days-[facility] [phone]

## 2013-01-13 ENCOUNTER — Telehealth: Payer: Self-pay | Admitting: Cardiovascular Disease

## 2013-01-13 NOTE — Telephone Encounter (Signed)
Pt calling in concerned about his medication that he has not received yet isosorbide mononitrate 30mg ...he stated that he spoke to someone last week and was promised that it would be taken care of.

## 2013-01-14 NOTE — Telephone Encounter (Signed)
Call to pharmacy and confirmed Rx was received.  Informed Isosorbide last filled 7.30.14 and insurance will not pay for it for a few more days.  Stated pt may have misunderstood.  Call to pt and informed. Pt verbalized understanding and agreed w/ plan.  Stated he will call [facility] to discuss with them later today.  Pt advised to call back if he needs anything else.  Pt verbalized understanding and agreed w/ plan.

## 2013-01-17 ENCOUNTER — Telehealth: Payer: Self-pay | Admitting: *Deleted

## 2013-01-17 ENCOUNTER — Telehealth (HOSPITAL_COMMUNITY): Payer: Self-pay | Admitting: Cardiovascular Disease

## 2013-01-17 MED ORDER — ISOSORBIDE MONONITRATE ER 30 MG PO TB24: 30.0000 mg | ORAL_TABLET | Freq: Every day | ORAL | Status: DC

## 2013-01-17 NOTE — Telephone Encounter (Signed)
Returned call.  Pt with concerns about Isosorbide Rx.  Stated he still hasn't gotten his refill.  Pt informed per last telephone note, that refill not due until the end of August.  Pt also concerned that 30-days cost more than 90-days and wants to switch back to the mail order pharmacy.  Pt informed he still will not be able to receive the refill early.  Informed mail order will send Rx when it's due.  Offered to resend Rx to [facility] for pt and pt agreed.  Refill(s) sent to pharmacy.  Pt will call [facility] to inform he no longer wants Rx filled there.  Stated he will stick w/ his mail order pharmacy.

## 2013-01-17 NOTE — Telephone Encounter (Signed)
See previous telephone notes from patient.  Patient states someone called him yesterday and left msg but he could not understand the name. He is still having problems getting his Isosorbide.

## 2013-01-17 NOTE — Telephone Encounter (Signed)
See other note 8.22.14.

## 2013-01-20 DIAGNOSIS — R942 Abnormal results of pulmonary function studies: Secondary | ICD-10-CM | POA: Diagnosis not present

## 2013-01-20 DIAGNOSIS — G4730 Sleep apnea, unspecified: Secondary | ICD-10-CM | POA: Diagnosis not present

## 2013-01-22 ENCOUNTER — Telehealth: Payer: Self-pay | Admitting: Cardiovascular Disease

## 2013-01-22 MED ORDER — CLOPIDOGREL BISULFATE 75 MG PO TABS: 75.0000 mg | ORAL_TABLET | Freq: Every day | ORAL | Status: DC

## 2013-01-22 MED ORDER — METOPROLOL TARTRATE 25 MG PO TABS: 12.5000 mg | ORAL_TABLET | Freq: Two times a day (BID) | ORAL | Status: DC

## 2013-01-22 NOTE — Telephone Encounter (Signed)
Please call-concerning his medicine-thought it was taken care of.

## 2013-01-22 NOTE — Telephone Encounter (Signed)
Returned call.  Pt with concerns about receiving isosorbide.  Pt reminded that last refilled on 7,30.14 and refill not due until 8.30.14.  Pt stated they (pharmacy) told him it was being mailed and he will get it by the 30th.  Pt informed he will have to wait for refill to arrive as insurance will not pay for early refill.  Pt verbalized understanding and agreed w/ plan.  Pt also needs refills on metoprolol and clopidogrel.  Refill(s) sent to pharmacy for 90-day supplies.  Pt verbalized understanding and agreed w/ plan.

## 2013-01-23 ENCOUNTER — Other Ambulatory Visit: Payer: Self-pay | Admitting: *Deleted

## 2013-01-23 MED ORDER — EZETIMIBE-SIMVASTATIN 10-20 MG PO TABS: 1.0000 | ORAL_TABLET | Freq: Every day | ORAL | Status: DC

## 2013-01-23 NOTE — Telephone Encounter (Signed)
Rx was sent to pharmacy electronically. 

## 2013-01-28 ENCOUNTER — Telehealth: Payer: Self-pay | Admitting: Cardiovascular Disease

## 2013-01-28 NOTE — Telephone Encounter (Signed)
Ordered from [facility] four medications only received one of them and only have 5 days left on his Metoprolol and Clopidogrel 75mg  and not understanding why is it that he is not able to get his medicine like he should .Marland Kitchen Please Call   Thanks

## 2013-01-29 NOTE — Telephone Encounter (Signed)
Returned call.  Left message that refills were sent to pharmacy and to call pharmacy to check status.  Also call back before 4pm if needed.

## 2013-01-31 ENCOUNTER — Telehealth: Payer: Self-pay | Admitting: Cardiovascular Disease

## 2013-01-31 MED ORDER — METOPROLOL TARTRATE 25 MG PO TABS: 12.5000 mg | ORAL_TABLET | Freq: Two times a day (BID) | ORAL | Status: DC

## 2013-01-31 NOTE — Telephone Encounter (Signed)
Returned call.  Pt stated he still hasn't received metoprolol.  Stated he did receive clopidogrel.  Pt informed both Rxs sent at the same.  RN will call [facility] to confirm and call pt back.  Pt verbalized understanding and agreed w/ plan.   Call to [facility] and spoke w/ Eber Jones.  Informed pt has a dedicated team w/ Mediare D and call transferred ([phone]).  Adriana on line.  Stated metoprolol shipped 9.2.14 and is estimated to arrive on 9.8.14.  RN asked if pt would be able to get short supply until Monday since it's the weekend.  Stated if pt gets [facility] pills [facility] a local [facility] it will cost about $1.10.  Also stated pt can call back for a tracking number if he likes.  Call to pt and informed.  Pt verbalized understanding and agreed w/ plan.  [facility] sent [facility] per pt request.

## 2013-01-31 NOTE — Telephone Encounter (Signed)
Pt insisted that he needed to talk to you today- about about his medicine.

## 2013-02-26 DIAGNOSIS — Z23 Encounter for immunization: Secondary | ICD-10-CM | POA: Diagnosis not present

## 2013-03-03 DIAGNOSIS — Z7901 Long term (current) use of anticoagulants: Secondary | ICD-10-CM | POA: Diagnosis not present

## 2013-03-03 DIAGNOSIS — G4730 Sleep apnea, unspecified: Secondary | ICD-10-CM | POA: Diagnosis not present

## 2013-03-26 ENCOUNTER — Telehealth: Payer: Self-pay | Admitting: Cardiovascular Disease

## 2013-03-26 NOTE — Telephone Encounter (Signed)
Returned call and pt verified x 2.  Pt stated he needs refills on metoprolol and isosorbide.  Pt informed both refilled in August 2014 and have refills.  Pt informed RN will call [facility].  Pt verbalized understanding and agreed w/ plan.  Call to [facility] and spoke w/ Chantel.  Informed pt c/o needing refills on Rxs sent in Aug. 2014.  Chantel reviewed records and stated pt just needs to call for refill on isosorbide as Rx is on hold.  Stated metoprolol not due to be refilled until 11.3.14.  Returned call.  Left message to call back.

## 2013-03-26 NOTE — Telephone Encounter (Signed)
Returned call and informed pt as stated below.  Pt will call pharmacy to have refills ordered.

## 2013-03-26 NOTE — Telephone Encounter (Signed)
Returning your call. °

## 2013-03-26 NOTE — Telephone Encounter (Signed)
Please call-said he talked to you earlier about his medicine.

## 2013-03-26 NOTE — Telephone Encounter (Signed)
No message

## 2013-04-02 ENCOUNTER — Telehealth: Payer: Self-pay | Admitting: Cardiovascular Disease

## 2013-04-02 NOTE — Telephone Encounter (Signed)
Returned call.  Pt stated he was told he needs a new Rx for isosorbide.  Pt informed Rx is on hold there and per representative RN spoke w/ on 10.29.14, he was supposed to call them to release the Rx on hold.  Pt confused and informed RN will call [facility] again to see if they will release Rx on hold.  Pt informed they will charge his account it they will fill it.  Pt verbalized understanding and agreed w/ plan.  Call to [facility] and spoke w/ Marcelino Duster.  Informed pt needs refill on isosorbide and Rx is on hold.  Marcelino Duster looked it up and verified Rx on hold w/ 2 fills.  Asked that she fill Rx for pt and agreed.  Confirmation# [id number].  Stated it will ship within 2 days.  Call to pt and informed.  Pt verbalized understanding and agreed w/ plan.  Confirmation number given.

## 2013-04-02 NOTE — Telephone Encounter (Signed)
Please call-concerning his medicine-said he talked to you  a while back about this.

## 2013-04-10 ENCOUNTER — Ambulatory Visit (INDEPENDENT_AMBULATORY_CARE_PROVIDER_SITE_OTHER): Payer: Medicare Other | Admitting: Cardiovascular Disease

## 2013-04-10 ENCOUNTER — Encounter: Payer: Self-pay | Admitting: Cardiovascular Disease

## 2013-04-10 VITALS — BP 140/70 | HR 47 | Ht 69.0 in | Wt 175.2 lb

## 2013-04-10 DIAGNOSIS — E785 Hyperlipidemia, unspecified: Secondary | ICD-10-CM

## 2013-04-10 DIAGNOSIS — R5381 Other malaise: Secondary | ICD-10-CM

## 2013-04-10 DIAGNOSIS — E782 Mixed hyperlipidemia: Secondary | ICD-10-CM

## 2013-04-10 DIAGNOSIS — G4733 Obstructive sleep apnea (adult) (pediatric): Secondary | ICD-10-CM

## 2013-04-10 DIAGNOSIS — I2510 Atherosclerotic heart disease of native coronary artery without angina pectoris: Secondary | ICD-10-CM

## 2013-04-10 NOTE — Patient Instructions (Signed)
Your physician recommends that you schedule a follow-up appointment in: 1YEAR.  No changes were made today in your therapy.  Your physician recommends that you return for lab work fasting.you do not need a appointment. The lab opens at 8:00. A. M. Be sure not to eat or drink past midnight the night prior to going to he  Lab.

## 2013-04-11 ENCOUNTER — Telehealth: Payer: Self-pay | Admitting: Cardiovascular Disease

## 2013-04-11 NOTE — Telephone Encounter (Signed)
Needs a lab order so he can have his lab work at [facility] where he lives..Please fax to [phone]

## 2013-04-11 NOTE — Telephone Encounter (Signed)
Returned call.  Pt stated he took the papers to have his labs drawn and was told he has to have orders directly from the doctor's office.  RN asked pt if he has the lab order w/ "[facility]" at the top and pt stated he does.  RN asked pt if he showed that sheet to staff and stated he did.  RN asked for number to call as order will be the same as what he has.  Number: [phone].    Call to [facility] and spoke w/ Bethann Berkshire.  Stated pt did come in and brought in a summary of his visit.  Stated pt did not have a Diplomatic Services operational officer lab order.  RN asked if pt brings in order will be able to draw labs and she stated she can.    Call to pt and informed.  Pt located requisition and admits he did not give her that paper.  Pt will go get labs done.

## 2013-04-16 ENCOUNTER — Other Ambulatory Visit: Payer: Self-pay | Admitting: *Deleted

## 2013-04-16 DIAGNOSIS — I2510 Atherosclerotic heart disease of native coronary artery without angina pectoris: Secondary | ICD-10-CM | POA: Diagnosis not present

## 2013-04-16 DIAGNOSIS — E782 Mixed hyperlipidemia: Secondary | ICD-10-CM | POA: Diagnosis not present

## 2013-04-16 DIAGNOSIS — R5381 Other malaise: Secondary | ICD-10-CM | POA: Diagnosis not present

## 2013-04-16 MED ORDER — TAMSULOSIN HCL 0.4 MG PO CAPS: 0.4000 mg | ORAL_CAPSULE | Freq: Every day | ORAL | Status: DC

## 2013-04-27 ENCOUNTER — Encounter: Payer: Self-pay | Admitting: Cardiovascular Disease

## 2013-04-27 DIAGNOSIS — E785 Hyperlipidemia, unspecified: Secondary | ICD-10-CM | POA: Insufficient documentation

## 2013-04-27 NOTE — Progress Notes (Signed)
Patient ID: Anthony Petty, male   DOB: 1927/07/01, 79 y.o.   MRN: [id number]     HPI: Anthony Petty is a 79 y.o. male who presents for a one-year cardiology evaluation.  [name] has remote history of bacterial endocarditis initially in 1980 and repeat in the 1990s. He has known coronary artery disease and in 2007 catheterization showed total occlusion of the circumflex vessel as well as high-grade AV groove circumflex and RCA disease for which she underwent stenting of his RCA and recanalization of a totally occluded circumflex and marginal system. Additional problems include paroxysmal atrial fibrillation. An echo Doppler study in August 2012 showed mild LVH with normal systolic function and grade 1 diastolic dysfunction. He had mild aortic sclerosis with mild-to-moderate aortic insufficiency, mild MR, mild TR and PR. There is mild probably hypertension with an estimated PA pressure 35 mm. His last stress test was in October 2013 prior to a trip to [place]. Previous to that he had experienced some vague symptoms of chest pain. The nuclear perfusion study was unchanged and continue to show fairly normal perfusion.  Over the past year, he has continued to do well. He also has obstructive sleep apnea and is on CPAP therapy followed by Dr. Maple Hudson. He has history of hyperlipidemia. He denies angina. He denies palpitations. He denies presyncope or syncope.  Past Medical History  Diagnosis Date  . Hyperlipidemia   . Colon cancer 1987  . Coronary artery disease     Past Surgical History  Procedure Laterality Date  . Knee surgery  2004    right knee  . Cardiac catheterization  2006     stents placed    No Known Allergies  Current Outpatient Prescriptions  Medication Sig Dispense Refill  . aspirin 81 MG tablet Take 81 mg by mouth daily.      . clopidogrel (PLAVIX) 75 MG tablet Take 1 tablet (75 mg total) by mouth daily.  90 tablet  3  . co-enzyme Q-10 30 MG capsule Take 30 mg by  mouth daily.      Marland Kitchen ezetimibe-simvastatin (VYTORIN) 10-20 MG per tablet Take 1 tablet by mouth at bedtime.  90 tablet  1  . fish oil-omega-3 fatty acids 1000 MG capsule Take 2 g by mouth daily.      . isosorbide mononitrate (IMDUR) 30 MG 24 hr tablet Take 1 tablet (30 mg total) by mouth daily.  90 tablet  1  . metoprolol tartrate (LOPRESSOR) 25 MG tablet Take 0.5 tablets (12.5 mg total) by mouth 2 (two) times daily.  5 tablet  0  . nitroGLYCERIN (NITROSTAT) 0.4 MG SL tablet Place 0.4 mg under the tongue every 5 (five) minutes as needed for chest pain.      . NON FORMULARY CPAP therapy      . saw palmetto 160 MG capsule Take 160 mg by mouth daily.      . tamsulosin (FLOMAX) 0.4 MG CAPS capsule Take 1 capsule (0.4 mg total) by mouth daily.  90 capsule  3   No current facility-administered medications for this visit.    History   Social History  . Marital Status: Married    Spouse Name: N/A    Number of Children: N/A  . Years of Education: N/A   Occupational History  . Not on file.   Social History Main Topics  . Smoking status: Never Smoker   . Smokeless tobacco: Never Used  . Alcohol Use: Yes     Comment: ocassional glass of  wine.  . Drug Use: Not on file  . Sexual Activity: Not on file   Other Topics Concern  . Not on file   Social History Narrative  . No narrative on file   Additional social history is notable that he is originally from [place]. He has 3 children 2 grandchildren. He does exercise. There is no alcohol or tobacco use.  History reviewed. No pertinent family history.  ROS is negative for fevers, chills or night sweats. He denies skin rash. He denies change in vision. He denies change in hearing. Denies cough increased production or wheezing. There are no palpitations. He denies presyncope or syncope. He denies angina. He denies nausea vomiting or diarrhea. There is no blood in the stool or urine. He denies diarrhea or constipation. He denies claudication. He  denies myalgias. There is no diabetes. He does have hyperlipidemia and seems to tolerate Vytorin with coenzyme Q10. He denies seizures or tremors.  Other comprehensive 12 point system review is negative.  PE BP 140/70  Pulse 47  Ht 5\' 9"  (1.753 m)  Wt 175 lb 3.2 oz (79.47 kg)  BMI 25.86 kg/m2  Repeat blood pressure by me was 120/70 General: Alert, oriented, no distress.  Skin: normal turgor, no rashes HEENT: Normocephalic, atraumatic. Pupils round and reactive; sclera anicteric;no lid lag.  Nose without nasal septal hypertrophy Mouth/Parynx benign; Mallinpatti scale 3 Neck: No JVD, no carotid briuts Lungs: clear to ausculatation and percussion; no wheezing or rales Heart: RRR, s1 s2 normal 1/6 systolic murmur unchanged, with a width of they are. Abdomen: soft, nontender; no hepatosplenomehaly, BS+; abdominal aorta nontender and not dilated by palpation. Pulses 2+ Extremities: no clubbing cyanosis or edema, Homan's sign negative  Neurologic: grossly nonfocal Psychologic: normal affect and mood.  ECG: Sinus bradycardia at 47 beats per minute. Normal intervals.  LABS:  BMET    Component Value Date/Time   NA 138 07/22/2007 0520   K 3.8 07/22/2007 0520   CL 106 07/22/2007 0520   CO2 25 07/22/2007 0520   GLUCOSE 120* 07/22/2007 0520   BUN 8 07/22/2007 0520   CREATININE 1.30 07/22/2007 0520   CALCIUM 9.1 07/22/2007 0520   GFRNONAA 53* 07/22/2007 0520   GFRAA  Value: >60        The eGFR has been calculated using the MDRD equation. This calculation has not been validated in all clinical 07/22/2007 0520     Hepatic Function Panel     Component Value Date/Time   PROT 6.4 07/21/2007 0503   ALBUMIN 3.2* 07/21/2007 0503   AST 67* 07/21/2007 0503   ALT 132* 07/21/2007 0503   ALKPHOS 74 07/21/2007 0503   BILITOT 2.0* 07/21/2007 0503     CBC    Component Value Date/Time   WBC 6.9 07/21/2007 0503   RBC 4.48 07/21/2007 0503   HGB 12.9* 07/21/2007 0503   HCT 38.1* 07/21/2007 0503   PLT 155  07/21/2007 0503   MCV 85.0 07/21/2007 0503   MCHC 33.9 07/21/2007 0503   RDW 14.1 07/21/2007 0503   LYMPHSABS 1.7 07/17/2007 1200   MONOABS 0.6 07/17/2007 1200   EOSABS 0.0 07/17/2007 1200   BASOSABS 0.0 07/17/2007 1200     BNP No results found for this basename: probnp    Lipid Panel  No results found for this basename: chol, trig, hdl, cholhdl, vldl, ldlcalc     RADIOLOGY: No results found.    ASSESSMENT AND PLAN: Mr. Pepitone continues to do well. He does have remote history of  endocarditis x2 initially in 1980 and subsequently into early 1990s. He is now 7 years status post intervention to his RCA and recanalization of the circumflex system which was noted to be occluded with collateralization from the LAD to the OM prior to intervention. He has a 30x28 mm Cypher stent in the AV groove and 2.5x13 mm Cypher stent in the OM1 ostium. The RCA has a 3.0x23 mm Cypher stent in its midsegment. Presently, Mr. spell he continues to remain stable. He is bradycardic but asymptomatic. I'm recommending complete her laboratory rechecked in the fasting state. Presently I will not adjust his medications depending upon laboratory results. He's only taking Lopressor 12.5 mg twice a day and I will not reduce this presently as always he remains asymptomatic. Her, did instruct that he did develop episodes of dizziness or further pulse lowering we may need to further reduce this or discontinue this altogether. As long as he remains stable, I will see him in one year for cardiology reevaluation.     Lennette Bihari, MD, [facility]  04/27/2013 10:07 PM

## 2013-04-28 ENCOUNTER — Encounter: Payer: Self-pay | Admitting: Cardiovascular Disease

## 2013-05-16 ENCOUNTER — Ambulatory Visit (INDEPENDENT_AMBULATORY_CARE_PROVIDER_SITE_OTHER): Payer: Medicare Other | Admitting: Cardiology

## 2013-05-16 ENCOUNTER — Encounter: Payer: Self-pay | Admitting: Cardiology

## 2013-05-16 VITALS — BP 122/70 | HR 45 | Ht 68.5 in | Wt 174.1 lb

## 2013-05-16 DIAGNOSIS — I2510 Atherosclerotic heart disease of native coronary artery without angina pectoris: Secondary | ICD-10-CM

## 2013-05-16 DIAGNOSIS — R001 Bradycardia, unspecified: Secondary | ICD-10-CM | POA: Insufficient documentation

## 2013-05-16 DIAGNOSIS — I498 Other specified cardiac arrhythmias: Secondary | ICD-10-CM

## 2013-05-16 DIAGNOSIS — R002 Palpitations: Secondary | ICD-10-CM

## 2013-05-16 HISTORY — DX: Palpitations: R00.2

## 2013-05-16 MED ORDER — NITROGLYCERIN 0.4 MG SL SUBL: 0.4000 mg | SUBLINGUAL_TABLET | SUBLINGUAL | Status: DC | PRN

## 2013-05-16 MED ORDER — HEART RATE MONITOR MISC: 1.0000 [IU] | Freq: Every day | Status: AC

## 2013-05-16 MED ORDER — METOPROLOL TARTRATE 25 MG PO TABS: 12.5000 mg | ORAL_TABLET | Freq: Every morning | ORAL | Status: DC

## 2013-05-16 NOTE — Patient Instructions (Signed)
Reduce Lopressor (Metroprolol) to 12.5 mg once a day. Take 1/2 tablet in the morning. Wear heart monitor for 2 weeks. You will need to to follow-up with Dr. Tresa Endo in 2-3.

## 2013-05-16 NOTE — Assessment & Plan Note (Signed)
Sinus brady on EKG with HR of 45 bpm. No heart block noted. BP stable. Pt endorsed weakness,  fatigue and palpiations earlier today. Will decrease Lopressor to just 12.5 mg once a day. Will have patient wear a 2 week event monitor to assess for degree of further bradycardia, potential heart blocks and tachyrhythmias.

## 2013-05-16 NOTE — Progress Notes (Signed)
Patient ID: Anthony Petty, male   DOB: [birth year], 79 y.o.   MRN: [id number] 05/16/2013 Anthony Petty   06-30-1927  [id number]  Primary Physicia ARONSON,RICHARD A, MD Primary Cardiologist: Dr. Tresa Endo  HPI:  The patient is a 79 y/o male, followed by Dr. Tresa Endo, who presents to clinic today for evaluation of a sudden episode of weakness, accompanied by a "fluttering in his chest" earlier this morning.   He has a remote history of bacterial endocarditis initially in 1980 and repeat in the 1990s. He has known coronary artery disease and in 2007 catheterization showed total occlusion of the circumflex vessel as well as high-grade AV groove circumflex and RCA disease for which he underwent stenting of his RCA and recanalization of a totally occluded circumflex and marginal system. Additional problems include paroxysmal atrial fibrillation. An echo Doppler study in August 2012 showed mild LVH with normal systolic function and grade 1 diastolic dysfunction. He had mild aortic sclerosis with mild-to-moderate aortic insufficiency, mild MR, mild TR and PR. There is mild probably hypertension with an estimated PA pressure 35 mm. His last stress test was in October 2013 prior to a trip to [place]. Previous to that he had experienced some vague symptoms of chest pain. The nuclear perfusion study was unchanged and continue to show fairly normal perfusion.   He continues to deny any chest pain or shortness of breath. He states that he was in his usual state of health until this AM, when he suddenly felt very tired and weak. He also noticed an unusual flutter in his chest. He said that his heart rate was not racing, but it felt like a "slow flutter".  He denies any dizziness, syncope/near syncope. His symptoms first began around 8:30 am and lasted nearly 3 hours before spontaneously resolving. This has never happened to him before. He denies any recent medication or dietary changes.   At time of office visit, he was  without any sypmtoms. Chart review reveals that he recently saw Dr. Tresa Endo in clinic on 04/10/13 for routine cardiovascular assessment. At that time, an EKG demonstrated sinus bradycardia with a HR of 47 bpm. Dr. Tresa Endo decided to continue him on Lopressor, 12.5 mg BID, since he was w/o symptoms. No changes were made at that time, however it was noted in Dr.Kelly's note that if he developed any symptoms, in the setting of bradycardia, that his BB may need to be reduced/ discontinued.     Current Outpatient Prescriptions  Medication Sig Dispense Refill  . aspirin 81 MG tablet Take 81 mg by mouth daily.      . clopidogrel (PLAVIX) 75 MG tablet Take 1 tablet (75 mg total) by mouth daily.  90 tablet  3  . co-enzyme Q-10 30 MG capsule Take 30 mg by mouth daily.      Marland Kitchen ezetimibe-simvastatin (VYTORIN) 10-20 MG per tablet Take 1 tablet by mouth at bedtime.  90 tablet  1  . fish oil-omega-3 fatty acids 1000 MG capsule Take 2 g by mouth daily.      . isosorbide mononitrate (IMDUR) 30 MG 24 hr tablet Take 1 tablet (30 mg total) by mouth daily.  90 tablet  1  . metoprolol tartrate (LOPRESSOR) 25 MG tablet Take 0.5 tablets (12.5 mg total) by mouth every morning.  5 tablet  0  . nitroGLYCERIN (NITROSTAT) 0.4 MG SL tablet Place 1 tablet (0.4 mg total) under the tongue every 5 (five) minutes as needed for chest pain.  25 tablet  2  .  NON FORMULARY CPAP therapy      . saw palmetto 160 MG capsule Take 160 mg by mouth daily.      . tamsulosin (FLOMAX) 0.4 MG CAPS capsule Take 1 capsule (0.4 mg total) by mouth daily.  90 capsule  3  . Misc. Devices (HEART RATE MONITOR) MISC 1 Units by Does not apply route daily.  1 each  0   No current facility-administered medications for this visit.    No Known Allergies  History   Social History  . Marital Status: Married    Spouse Name: N/A    Number of Children: N/A  . Years of Education: N/A   Occupational History  . Not on file.   Social History Main Topics  .  Smoking status: Never Smoker   . Smokeless tobacco: Never Used  . Alcohol Use: Yes     Comment: ocassional glass of wine.  . Drug Use: Not on file  . Sexual Activity: Not on file   Other Topics Concern  . Not on file   Social History Narrative  . No narrative on file     Review of Systems: General: negative for chills, fever, night sweats or weight changes.  Cardiovascular: negative for chest pain, dyspnea on exertion, edema, orthopnea, palpitations, paroxysmal nocturnal dyspnea or shortness of breath Dermatological: negative for rash Respiratory: negative for cough or wheezing Urologic: negative for hematuria Abdominal: negative for nausea, vomiting, diarrhea, bright red blood per rectum, melena, or hematemesis Neurologic: negative for visual changes, syncope, or dizziness All other systems reviewed and are otherwise negative except as noted above.    Blood pressure 122/70, pulse 45, height 5' 8.5" (1.74 m), weight 174 lb 1.6 oz (78.971 kg).  General appearance: alert, cooperative and no distress Neck: no carotid bruit and no JVD Lungs: clear to auscultation bilaterally Heart: slow rate and reg rhythm Abdomen: soft, non-tender; bowel sounds normal; no masses,  no organomegaly Extremities: no LEE Pulses: 2+ and symmetric Skin: warm and dry Neurologic: Grossly normal  EKG Sinus Bradycardia  ASSESSMENT AND PLAN:   Bradycardia Sinus brady on EKG with HR of 45 bpm. No heart block noted. BP stable. Pt endorsed weakness,  fatigue and palpiations earlier today. Will decrease Lopressor to just 12.5 mg once a day. Will have patient wear a 2 week event monitor to assess for degree of further bradycardia, potential heart blocks and tachyrhythmias.   Palpitations Pt noted a "slow flutter" in chest today with weakness and fatigue. H/o PAF. Sinus bradycardia on EKG. Will wear event monitor for 2 weeks. On a beta blocker.  CAD Stable. Denies any anginal sypmtoms. No SOB. He has been  concerned, however, because he has lost 2 siblings recently to MI. We discussed the warning signs of MI, including chest pain, pressure, tightness, neck, jaw, back, upper extremity pain, SOB, diaphoreis, n/v. We discussed proper use of ASA and SL NTG if ACS is expected. Pt asked for re-order of SL NTG, in the event that any such symptoms should occur in the future. Pt was instructed to continue ASA, Plavix, BB, Imdur and statin.    PLAN  With recent symptoms, in the setting of bradycardia, I have elected to decrease his beta blocker to once daily. He was already on a low dose of Lopressor. I have instructed him to take only 12.5 mg daily. I instructed him to take in the mornings. I have also ordred for him to wear a 2 week event monitor. The patient was fitted  and instructed on the proper use of the device during his appointment. He was instructed to activate the monitor if any of his symptoms recur. This will help determine if his symptoms were indeed caused by any brady or tachy arrthymias. I have ordered for him to return to clinic in 2-3 weeks. He will w/u with Dr. Tresa Endo.   Anthony Petty, BRITTAINYPA-C 05/16/2013 5:41 PM

## 2013-05-16 NOTE — Assessment & Plan Note (Signed)
Pt noted a "slow flutter" in chest today with weakness and fatigue. H/o PAF. Sinus bradycardia on EKG. Will wear event monitor for 2 weeks. On a beta blocker.

## 2013-05-16 NOTE — Assessment & Plan Note (Addendum)
Stable. Denies any anginal sypmtoms. No SOB. He has been concerned, however, because he has lost 2 siblings recently to MI. We discussed the warning signs of MI, including chest pain, pressure, tightness, neck, jaw, back, upper extremity pain, SOB, diaphoreis, n/v. We discussed proper use of ASA and SL NTG if ACS is expected. Pt asked for re-order of SL NTG, in the event that any such symptoms should occur in the future. Pt was instructed to continue ASA, Plavix, BB, Imdur and statin.

## 2013-05-17 DIAGNOSIS — R002 Palpitations: Secondary | ICD-10-CM | POA: Diagnosis not present

## 2013-06-02 ENCOUNTER — Ambulatory Visit (INDEPENDENT_AMBULATORY_CARE_PROVIDER_SITE_OTHER): Payer: Medicare Other | Admitting: Cardiology

## 2013-06-02 ENCOUNTER — Encounter: Payer: Self-pay | Admitting: Cardiology

## 2013-06-02 VITALS — BP 140/70 | HR 46 | Ht 68.0 in | Wt 174.0 lb

## 2013-06-02 DIAGNOSIS — R079 Chest pain, unspecified: Secondary | ICD-10-CM

## 2013-06-02 DIAGNOSIS — I208 Other forms of angina pectoris: Secondary | ICD-10-CM

## 2013-06-02 DIAGNOSIS — G4733 Obstructive sleep apnea (adult) (pediatric): Secondary | ICD-10-CM

## 2013-06-02 DIAGNOSIS — I498 Other specified cardiac arrhythmias: Secondary | ICD-10-CM | POA: Diagnosis not present

## 2013-06-02 DIAGNOSIS — I2089 Other forms of angina pectoris: Secondary | ICD-10-CM

## 2013-06-02 DIAGNOSIS — I2510 Atherosclerotic heart disease of native coronary artery without angina pectoris: Secondary | ICD-10-CM | POA: Diagnosis not present

## 2013-06-02 DIAGNOSIS — R001 Bradycardia, unspecified: Secondary | ICD-10-CM

## 2013-06-02 DIAGNOSIS — R002 Palpitations: Secondary | ICD-10-CM | POA: Diagnosis not present

## 2013-06-02 HISTORY — DX: Other forms of angina pectoris: I20.8

## 2013-06-02 HISTORY — DX: Other forms of angina pectoris: I20.89

## 2013-06-02 NOTE — Patient Instructions (Signed)
Your physician has requested that you have a lexiscan myoview. For further information please visit [email or web address]. Please follow instruction sheet, as given.   

## 2013-06-02 NOTE — Assessment & Plan Note (Signed)
He complains of "tingling" Lt upper chest with inspiration

## 2013-06-02 NOTE — Assessment & Plan Note (Signed)
Chronic, beta blocker decreased 05/16/13. Holter shows no significant bradycardia.

## 2013-06-02 NOTE — Assessment & Plan Note (Signed)
On C-pap 

## 2013-06-02 NOTE — Progress Notes (Signed)
06/02/2013 Anthony Petty   05-27-1928  [id number]  Primary Physicia ARONSON,RICHARD A, MD Primary Cardiologist: Dr [name]  HPI:  Mr. Martelli is seen in the office today as a walk in. He has remote history of bacterial endocarditis initially in 1980 and repeat in the 1990s. He has known coronary artery disease and in 2007 catheterization showed total occlusion of the circumflex vessel as well as high-grade AV groove circumflex and RCA disease for which she underwent stenting of his RCA and recanalization of a totally occluded circumflex and marginal system. Additional problems include paroxysmal atrial fibrillation. An echo Doppler study in August 2012 showed mild LVH with normal systolic function and grade 1 diastolic dysfunction. He had mild aortic sclerosis with mild-to-moderate aortic insufficiency, mild MR, mild TR and PR. There is mild probably hypertension with an estimated PA pressure 35 mm. His last stress test was in October 2013 prior to a trip to [place]. Previous to that he had experienced some vague symptoms of chest pain. The nuclear perfusion study was unchanged and continue to show fairly normal perfusion.  He also has obstructive sleep apnea and is on CPAP therapy followed by Dr. Annamaria Boots. He saw Ellen Henri 05/16/13 after having some palpitations. He is always bradycardic but asymptomatic. [place] did cut his Metoprolol to 12.5 mg daily and ordered a two week Holter.           He walked into the office today at lunch complaining of chest pain. He describes Lt upper chest "tingling" when he takes a deep breath. He denies any chest pressure or tightness. He denies any SOB. He denies any further palpitations. His Holter was reviewed. He had one episoded of 4 beats of PSVT and occasional PAC. His bradycardia is unchanged, his rate went no lower than 47.     Current Outpatient Prescriptions  Medication Sig Dispense Refill  . aspirin 81 MG tablet Take 81 mg by mouth daily.      .  clopidogrel (PLAVIX) 75 MG tablet Take 1 tablet (75 mg total) by mouth daily.  90 tablet  3  . co-enzyme Q-10 30 MG capsule Take 30 mg by mouth daily.      Marland Kitchen ezetimibe-simvastatin (VYTORIN) 10-20 MG per tablet Take 1 tablet by mouth at bedtime.  90 tablet  1  . fish oil-omega-3 fatty acids 1000 MG capsule Take 2 g by mouth daily.      . isosorbide mononitrate (IMDUR) 30 MG 24 hr tablet Take 1 tablet (30 mg total) by mouth daily.  90 tablet  1  . metoprolol tartrate (LOPRESSOR) 25 MG tablet Take 0.5 tablets (12.5 mg total) by mouth every morning.  5 tablet  0  . nitroGLYCERIN (NITROSTAT) 0.4 MG SL tablet Place 1 tablet (0.4 mg total) under the tongue every 5 (five) minutes as needed for chest pain.  25 tablet  2  . NON FORMULARY CPAP therapy      . saw palmetto 160 MG capsule Take 160 mg by mouth daily.      . tamsulosin (FLOMAX) 0.4 MG CAPS capsule Take 1 capsule (0.4 mg total) by mouth daily.  90 capsule  3   No current facility-administered medications for this visit.    No Known Allergies  History   Social History  . Marital Status: Married    Spouse Name: N/A    Number of Children: N/A  . Years of Education: N/A   Occupational History  . Not on file.   Social History Main Topics  .  Smoking status: Never Smoker   . Smokeless tobacco: Never Used  . Alcohol Use: Yes     Comment: ocassional glass of wine.  . Drug Use: Not on file  . Sexual Activity: Not on file   Other Topics Concern  . Not on file   Social History Narrative  . No narrative on file     Review of Systems: General: negative for chills, fever, night sweats or weight changes.  Cardiovascular: negative for chest pain, dyspnea on exertion, edema, orthopnea, palpitations, paroxysmal nocturnal dyspnea or shortness of breath Dermatological: negative for rash Respiratory: negative for cough or wheezing Urologic: negative for hematuria Abdominal: negative for nausea, vomiting, diarrhea, bright red blood per  rectum, melena, or hematemesis Neurologic: negative for visual changes, syncope, or dizziness All other systems reviewed and are otherwise negative except as noted above.    Blood pressure 140/70, pulse 46, height 5\' 8"  (1.727 m), weight 174 lb (78.926 kg).  General appearance: alert, cooperative and no distress Lungs: clear to auscultation bilaterally Heart: regular rate and rhythm Extremities: no edema  EKG NSR, SB  ASSESSMENT AND PLAN:   CAD- RCA sten, CFX PCI '07. Negative Mtoview Oct 2013 He has never had typical angina.  Chest pain at rest He complains of "tingling" Lt upper chest with inspiration  Bradycardia Chronic, beta blocker decreased 05/16/13. Holter shows no significant bradycardia.  Palpitations Holter shows one 4 bt psvt episode  OBSTRUCTIVE SLEEP APNEA On C-pap   PLAN  I am going to repeat Mr [facility]. It's been a year and a half. His past symptoms were atypical and he is concerned. He'll follow up with Dr [name] as scheduled unless the results of his Myoview suggest otherwise.  Kristian Hazzard KPA-C 06/02/2013 1:59 PM

## 2013-06-02 NOTE — Assessment & Plan Note (Signed)
He has never had typical angina.

## 2013-06-02 NOTE — Assessment & Plan Note (Signed)
Holter shows one 4 bt psvt episode

## 2013-06-06 ENCOUNTER — Ambulatory Visit (HOSPITAL_COMMUNITY)
Admission: RE | Admit: 2013-06-06 | Discharge: 2013-06-06 | Disposition: A | Discharge status: Home / Self Care | Payer: Medicare Other | Source: Ambulatory Visit | Attending: Cardiovascular Disease | Admitting: Cardiovascular Disease

## 2013-06-06 DIAGNOSIS — Z125 Encounter for screening for malignant neoplasm of prostate: Secondary | ICD-10-CM | POA: Diagnosis not present

## 2013-06-06 DIAGNOSIS — R079 Chest pain, unspecified: Secondary | ICD-10-CM | POA: Diagnosis not present

## 2013-06-06 DIAGNOSIS — I10 Essential (primary) hypertension: Secondary | ICD-10-CM | POA: Diagnosis not present

## 2013-06-06 DIAGNOSIS — E785 Hyperlipidemia, unspecified: Secondary | ICD-10-CM | POA: Diagnosis not present

## 2013-06-06 MED ORDER — TECHNETIUM TC 99M SESTAMIBI GENERIC - CARDIOLITE
10.7000 | Freq: Once | INTRAVENOUS | Status: AC | PRN
  Administered 2013-06-06: 11 via INTRAVENOUS

## 2013-06-06 MED ORDER — REGADENOSON 0.4 MG/5ML IV SOLN
0.4000 mg | Freq: Once | INTRAVENOUS | Status: AC
  Administered 2013-06-06: 0.4 mg via INTRAVENOUS

## 2013-06-06 MED ORDER — TECHNETIUM TC 99M SESTAMIBI GENERIC - CARDIOLITE
30.8000 | Freq: Once | INTRAVENOUS | Status: AC | PRN
  Administered 2013-06-06: 30.8 via INTRAVENOUS

## 2013-06-06 NOTE — Procedures (Addendum)
[facility] [address] [place] [address] [phone]  Cardiology Nuclear Med Study  Anthony Petty is a 80 y.o. male     MRN : [id number]     DOB: [birth year]  Procedure Date: 06/06/2013  Nuclear Med Background Indication for Stress Test:  Abnormal EKG History:  CAD;MI;STENT/PTCA;Colon CA;PAF;CHF;Bacterial Endocarditis Cardiac Risk Factors: Family History - CAD, Lipids and Overweight  Symptoms:  Chest Pain, Light-Headedness and Palpitations   Nuclear Pre-Procedure Caffeine/Decaff Intake:  1:00am NPO After: 11am   IV Site: R Forearm  IV 0.9% NS with Angio Cath:  22g  Chest Size (in):  40"  IV Started by: Azucena Cecil, RN  Height: 5\' 8"  (1.727 m)  Cup Size: n/a  BMI:  Body mass index is 26.46 kg/(m^2). Weight:  174 lb (78.926 kg)   Tech Comments:  n/a    Nuclear Med Study 1 or 2 day study: 1 day  Stress Test Type:  [place]  Order Authorizing Provider:  Shelva Majestic, MD   Resting Radionuclide: Technetium 66m Sestamibi  Resting Radionuclide Dose: 10.7 mCi   Stress Radionuclide:  Technetium 57m Sestamibi  Stress Radionuclide Dose: 30.8 mCi           Stress Protocol Rest HR: 52 Stress HR: 57  Rest BP: 178/79 Stress BP: 150/70  Exercise Time (min): n/a METS: n/a   Predicted Max HR: 137 bpm % Max HR: 45.99 bpm Rate Pressure Product: 11214  Dose of Adenosine (mg):  n/a Dose of Lexiscan: 0.4 mg  Dose of Atropine (mg): n/a Dose of Dobutamine: n/a mcg/kg/min (at max HR)  Stress Test Technologist: Leane Para, CCT Nuclear Technologist: Imagene Riches, CNMT   Rest Procedure:  Myocardial perfusion imaging was performed at rest 45 minutes following the intravenous administration of Technetium 32m Sestamibi. Stress Procedure:  The patient received IV Lexiscan 0.4 mg over 15-seconds.  Technetium 32m Sestamibi injected at 30-seconds.  There were no significant changes with Lexiscan.  Quantitative spect images were  obtained after a 45 minute delay.  Transient Ischemic Dilatation (Normal <1.22):  1.18 Lung/Heart Ratio (Normal <0.45):  0.29 QGS EDV:  94 ml QGS ESV:  33 ml LV Ejection Fraction: 65%  Signed by      Rest ECG: NSR - Normal EKG  Stress ECG: No significant change from baseline ECG  QPS Raw Data Images:  Normal; no motion artifact; normal heart/lung ratio. Stress Images:  Normal homogeneous uptake in all areas of the myocardium. Rest Images:  Normal homogeneous uptake in all areas of the myocardium. Subtraction (SDS):  No evidence of ischemia.  Impression Exercise Capacity:  Lexiscan with no exercise. BP Response:  Normal blood pressure response. Clinical Symptoms:  No significant symptoms noted. ECG Impression:  No significant ST segment change suggestive of ischemia. Comparison with Prior Nuclear Study: No significant change from previous study  Overall Impression:  Normal stress nuclear study.  LV Wall Motion:  NL LV Function; NL Wall Motion   Lorretta Harp, MD  06/06/2013 4:56 PM

## 2013-06-10 ENCOUNTER — Ambulatory Visit (INDEPENDENT_AMBULATORY_CARE_PROVIDER_SITE_OTHER): Payer: PRIVATE HEALTH INSURANCE | Admitting: Cardiovascular Disease

## 2013-06-10 ENCOUNTER — Encounter: Payer: Self-pay | Admitting: Cardiovascular Disease

## 2013-06-10 VITALS — BP 140/80 | HR 52 | Ht 68.5 in | Wt 174.5 lb

## 2013-06-10 DIAGNOSIS — I2510 Atherosclerotic heart disease of native coronary artery without angina pectoris: Secondary | ICD-10-CM

## 2013-06-10 DIAGNOSIS — I7300 Raynaud's syndrome without gangrene: Secondary | ICD-10-CM

## 2013-06-10 DIAGNOSIS — G4733 Obstructive sleep apnea (adult) (pediatric): Secondary | ICD-10-CM | POA: Diagnosis not present

## 2013-06-10 DIAGNOSIS — I498 Other specified cardiac arrhythmias: Secondary | ICD-10-CM | POA: Diagnosis not present

## 2013-06-10 DIAGNOSIS — E785 Hyperlipidemia, unspecified: Secondary | ICD-10-CM | POA: Diagnosis not present

## 2013-06-10 DIAGNOSIS — R001 Bradycardia, unspecified: Secondary | ICD-10-CM

## 2013-06-10 HISTORY — DX: Raynaud's syndrome without gangrene: I73.00

## 2013-06-10 MED ORDER — AMLODIPINE BESYLATE 5 MG PO TABS: ORAL_TABLET | ORAL | Status: DC

## 2013-06-10 NOTE — Patient Instructions (Addendum)
Your physician recommends that you schedule a follow-up appointment in: 6 MONTHS.  Your physician has recommended you make the following change in your medication: start the new prescription given for amlodipine. As directed. This has already been sent to the pharmacy.

## 2013-06-10 NOTE — Progress Notes (Signed)
Patient ID: Anthony Petty, male   DOB: 15-Jul-1927, 80 y.o.   MRN: [id number]      HPI: Anthony Petty is a 80 y.o. male who presents for a follow cardiology evaluation. I had seen 2 months ago. Apparently, since that time he was seen as an add-on by physician extenders Anthony Petty on May 16, 2013 and most recently Anthony Petty on 06/02/2013. He now presents for cardiology followup evaluation  Anthony Petty has remote history of bacterial endocarditis initially in 1980 and repeat in the 1990s. He has known coronary artery disease and in 2007 catheterization showed total occlusion of the circumflex vessel as well as high-grade AV groove circumflex and RCA disease for which she underwent stenting of his RCA and recanalization of a totally occluded circumflex and marginal system. Additional problems include paroxysmal atrial fibrillation. An echo Doppler study in August 2012 showed mild LVH with normal systolic function and grade 1 diastolic dysfunction. He had mild aortic sclerosis with mild-to-moderate aortic insufficiency, mild MR, mild TR and PR. There is mild probably hypertension with an estimated PA pressure 35 mm. His last stress test was in October 2013 prior to a trip to [place]. Previous to that he had experienced some vague symptoms of chest pain. The nuclear perfusion study was unchanged and continue to show fairly normal perfusion. He also has a history of obstructive sleep apnea for which he sees Anthony Petty.  Since I last saw him, he was seen as an add-on in December by Anthony Petty at which time his pulse was noted to be 45. He had noted some occasional palpitations, weakness and fatigue. His beta blocker dose was reduced from Lopressor 12.5 twice a day to 12.5 mg daily and he was referred for a two-week CardioNet monitor. He subsequently had followup by Anthony Petty 2 weeks later and is CardioNet monitor had shown sinus rhythm with a 4 beat episode of PSVT. He also  complained of "tingling "in the left upper chest region with inspiration. He was referred for nuclear perfusion study which was done last week. This revealed entirely normal perfusion without scar or ischemia. He now presents for evaluation.  He does feel improved on reduced dose of beta blocker;  he denies exertional chest pain. He denies shortness of breath. He denies presyncope or syncope.  In addition to the above, the patient also complains today of his fingers turning white particularly with cold weather on a routine basis. He has to wear gloves the entire winter season and at times there may be transient red and blue discoloration.   Past Medical History  Diagnosis Date  . Hyperlipidemia   . Colon cancer 1987  . Coronary artery disease     Past Surgical History  Procedure Laterality Date  . Knee surgery  2004    right knee  . Cardiac catheterization  2006     stents placed    No Known Allergies  Current Outpatient Prescriptions  Medication Sig Dispense Refill  . aspirin 81 MG tablet Take 81 mg by mouth daily.      . clopidogrel (PLAVIX) 75 MG tablet Take 1 tablet (75 mg total) by mouth daily.  90 tablet  3  . co-enzyme Q-10 30 MG capsule Take 30 mg by mouth daily.      Marland Kitchen ezetimibe-simvastatin (VYTORIN) 10-20 MG per tablet Take 1 tablet by mouth at bedtime.  90 tablet  1  . fish oil-omega-3 fatty acids 1000 MG capsule Take 2 g by mouth daily.      Marland Kitchen  isosorbide mononitrate (IMDUR) 30 MG 24 hr tablet Take 1 tablet (30 mg total) by mouth daily.  90 tablet  1  . metoprolol tartrate (LOPRESSOR) 25 MG tablet Take 0.5 tablets (12.5 mg total) by mouth every morning.  5 tablet  0  . nitroGLYCERIN (NITROSTAT) 0.4 MG SL tablet Place 1 tablet (0.4 mg total) under the tongue every 5 (five) minutes as needed for chest pain.  25 tablet  2  . NON FORMULARY CPAP therapy      . saw palmetto 160 MG capsule Take 160 mg by mouth daily.      . tamsulosin (FLOMAX) 0.4 MG CAPS capsule Take 1  capsule (0.4 mg total) by mouth daily.  90 capsule  3   No current facility-administered medications for this visit.    History   Social History  . Marital Status: Married    Spouse Name: N/A    Number of Children: N/A  . Years of Education: N/A   Occupational History  . Not on file.   Social History Main Topics  . Smoking status: Never Smoker   . Smokeless tobacco: Never Used  . Alcohol Use: Yes     Comment: ocassional glass of wine.  . Drug Use: Not on file  . Sexual Activity: Not on file   Other Topics Concern  . Not on file   Social History Narrative  . No narrative on file   Additional social history is notable that he is originally from [place]. He has 3 children 2 grandchildren. He does exercise. There is no alcohol or tobacco use.  No family history on file.  ROS is negative for fevers, chills or night sweats. He denies skin rash. He denies change in vision. He denies change in hearing. Denies cough increased production or wheezing. Palpitations have improved He denies presyncope or syncope. He denies angina. He denies nausea vomiting or diarrhea. There is no blood in the stool or urine. He denies diarrhea or constipation. He denies claudication. He denies myalgias. There is no diabetes. He does have hyperlipidemia and seems to tolerate Vytorin with coenzyme Q10. He denies seizures or tremors.  Other comprehensive 14 point system review is negative.  PE BP 140/80  Pulse 52  Ht 5' 8.5" (1.74 m)  Wt 174 lb 8 oz (79.153 kg)  BMI 26.14 kg/m2  Repeat blood pressure by me was 120/70 General: Alert, oriented, no distress.  Skin: normal turgor, no rashes HEENT: Normocephalic, atraumatic. Pupils round and reactive; sclera anicteric;no lid lag.  Nose without nasal septal hypertrophy Mouth/Parynx benign; Mallinpatti scale 3 Neck: No JVD, no carotid briuts Lungs: clear to ausculatation and percussion; no wheezing or rales Heart: RRR, s1 s2 normal 1/6 systolic murmur  unchanged, with a whiff of AR Abdomen: soft, nontender; no hepatosplenomehaly, BS+; abdominal aorta nontender and not dilated by palpation. Pulses 2+ Extremities: no clubbing cyanosis or edema, Homan's sign negative  Neurologic: grossly nonfocal Psychologic: normal affect and mood.  ECG's were reviewed from recent visits. No EKG was done today in light of his stress test done last week. His resting pulse today he did increase to 50-55 beats per minute. LABS:  BMET    Component Value Date/Time   NA 138 07/22/2007 0520   K 3.8 07/22/2007 0520   CL 106 07/22/2007 0520   CO2 25 07/22/2007 0520   GLUCOSE 120* 07/22/2007 0520   BUN 8 07/22/2007 0520   CREATININE 1.30 07/22/2007 0520   CALCIUM 9.1 07/22/2007 0520   GFRNONAA  53* 07/22/2007 0520   GFRAA  Value: >60        The eGFR has been calculated using the MDRD equation. This calculation has not been validated in all clinical 07/22/2007 0520     Hepatic Function Panel     Component Value Date/Time   PROT 6.4 07/21/2007 0503   ALBUMIN 3.2* 07/21/2007 0503   AST 67* 07/21/2007 0503   ALT 132* 07/21/2007 0503   ALKPHOS 74 07/21/2007 0503   BILITOT 2.0* 07/21/2007 0503     CBC    Component Value Date/Time   WBC 6.9 07/21/2007 0503   RBC 4.48 07/21/2007 0503   HGB 12.9* 07/21/2007 0503   HCT 38.1* 07/21/2007 0503   PLT 155 07/21/2007 0503   MCV 85.0 07/21/2007 0503   MCHC 33.9 07/21/2007 0503   RDW 14.1 07/21/2007 0503   LYMPHSABS 1.7 07/17/2007 1200   MONOABS 0.6 07/17/2007 1200   EOSABS 0.0 07/17/2007 1200   BASOSABS 0.0 07/17/2007 1200     BNP No results found for this basename: probnp    Lipid Panel  No results found for this basename: chol,  trig,  hdl,  cholhdl,  vldl,  ldlcalc     RADIOLOGY: No results found.    ASSESSMENT AND PLAN: Mr. Ayala has a remote history of endocarditis x2 initially in 57 and subsequently into early 1990s. He is now 7 years status post intervention to his RCA and recanalization of the  circumflex system which was noted to be occluded with collateralization from the LAD to the OM prior to intervention. He has a 30x28 mm Cypher stent in the AV groove and 2.5x13 mm Cypher stent in the OM1 ostium. The RCA has a 3.0x23 mm Cypher stent in its midsegment. Mr. Boeckman did develop bradycardia and his resting pulse is now improved in the 50s on a reduced dose of Lopressor 12.5 mg now and is only taking daily. His tingling in his chest most likely is of musculoskeletal etiology. His nuclear perfusion studies argues against CAD progression or ischemia. He's not having any bowel bradycardia or bursts of rapid rapid tachycardia palpitations although he can have a 4 beat run of nonsustained supraventricular tachycardia on his recent monitor. Upon further questioning, he also admits to his fingers turning white particularly with cold weather and during the entire winter months he typically has to wear gloves. I suspect this may be due to potential digital artery vasospasm and possible Raynaud phenomenon. For this reason I am adding amlodipine have written him a prescription for 5 mg daily. He will initially try taking this a 2.5 mg but continued symptoms develop he will increase this to 5 mg. He will monitor his blood pressure. He'll continue to use his CPAP for his obstructive sleep apnea particularly with his cardiovascular comorbidities.   Anthony Sine, MD, [facility]  06/10/2013 4:24 PM

## 2013-06-11 DIAGNOSIS — Z1212 Encounter for screening for malignant neoplasm of rectum: Secondary | ICD-10-CM | POA: Diagnosis not present

## 2013-06-13 DIAGNOSIS — Z933 Colostomy status: Secondary | ICD-10-CM | POA: Diagnosis not present

## 2013-06-13 DIAGNOSIS — I10 Essential (primary) hypertension: Secondary | ICD-10-CM | POA: Diagnosis not present

## 2013-06-13 DIAGNOSIS — E785 Hyperlipidemia, unspecified: Secondary | ICD-10-CM | POA: Diagnosis not present

## 2013-06-13 DIAGNOSIS — Z1331 Encounter for screening for depression: Secondary | ICD-10-CM | POA: Diagnosis not present

## 2013-06-13 DIAGNOSIS — Z23 Encounter for immunization: Secondary | ICD-10-CM | POA: Diagnosis not present

## 2013-06-13 DIAGNOSIS — G4733 Obstructive sleep apnea (adult) (pediatric): Secondary | ICD-10-CM | POA: Diagnosis not present

## 2013-06-13 DIAGNOSIS — N401 Enlarged prostate with lower urinary tract symptoms: Secondary | ICD-10-CM | POA: Diagnosis not present

## 2013-06-13 DIAGNOSIS — R972 Elevated prostate specific antigen [PSA]: Secondary | ICD-10-CM | POA: Diagnosis not present

## 2013-06-13 DIAGNOSIS — M1990 Unspecified osteoarthritis, unspecified site: Secondary | ICD-10-CM | POA: Diagnosis not present

## 2013-06-13 DIAGNOSIS — Z0000 Encounter for general adult medical examination without abnormal findings: Secondary | ICD-10-CM | POA: Diagnosis not present

## 2013-07-25 ENCOUNTER — Telehealth: Payer: Self-pay | Admitting: Cardiovascular Disease

## 2013-07-25 MED ORDER — EZETIMIBE-SIMVASTATIN 10-20 MG PO TABS: 1.0000 | ORAL_TABLET | Freq: Every day | ORAL | Status: DC

## 2013-07-25 NOTE — Telephone Encounter (Signed)
Needing a renewal on Vytorin 10mg /20mg  .. Needs to call [facility] to send in a new prescription . Per pt [facility] will not renew without having a call from [name] .Marland Kitchen [facility] [phone].Marland Kitchen Please call if you have any questions

## 2013-07-25 NOTE — Telephone Encounter (Signed)
Rx was sent to pharmacy electronically. 

## 2013-10-27 ENCOUNTER — Other Ambulatory Visit: Payer: Self-pay | Admitting: Internal Medicine

## 2013-10-27 NOTE — Telephone Encounter (Signed)
Rx was sent to pharmacy electronically. 

## 2014-01-27 ENCOUNTER — Telehealth: Payer: Self-pay | Admitting: Cardiovascular Disease

## 2014-01-27 NOTE — Telephone Encounter (Signed)
Returned call to pharmacy. Informed him that gentamicin is no on patient's medication list and last OV with Dr. [name] was in January. Informed pharmacy that this refill should be deferred to PCP or prescribing provider. Pharmacy staff claimed that patient told them to contact Dr. Evette Georges office for refill.   RN called patient's wife - cell number listed. Explained to her that cardiologist generally do not refill antibiotics, especially given no record of this medication being taken according to last OV. Wife will explain to patient have him reach out to Dr. Jacquiline Doe office again (of which she states PCP said to defer refill to Dr. [name])

## 2014-01-27 NOTE — Telephone Encounter (Signed)
Need a new prescription for his Gentamicin 40 mg /ml vial # 4ml

## 2014-01-29 DIAGNOSIS — Z792 Long term (current) use of antibiotics: Secondary | ICD-10-CM | POA: Diagnosis not present

## 2014-02-04 ENCOUNTER — Telehealth: Payer: Self-pay | Admitting: Cardiovascular Disease

## 2014-02-04 ENCOUNTER — Other Ambulatory Visit: Payer: Self-pay | Admitting: Cardiovascular Disease

## 2014-02-04 NOTE — Telephone Encounter (Signed)
Rx refill sent to patient pharmacy   

## 2014-02-05 ENCOUNTER — Ambulatory Visit: Payer: Medicare Other | Admitting: Cardiovascular Disease

## 2014-02-09 ENCOUNTER — Telehealth: Payer: Self-pay | Admitting: Cardiovascular Disease

## 2014-02-09 MED ORDER — CLOPIDOGREL BISULFATE 75 MG PO TABS: 75.0000 mg | ORAL_TABLET | Freq: Every day | ORAL | Status: DC

## 2014-02-09 MED ORDER — ISOSORBIDE MONONITRATE ER 30 MG PO TB24: 30.0000 mg | ORAL_TABLET | Freq: Every day | ORAL | Status: DC

## 2014-02-09 MED ORDER — EZETIMIBE-SIMVASTATIN 10-20 MG PO TABS: 1.0000 | ORAL_TABLET | Freq: Every day | ORAL | Status: DC

## 2014-02-09 NOTE — Telephone Encounter (Signed)
Closed encounter °

## 2014-02-09 NOTE — Telephone Encounter (Signed)
Rx refill sent to patient pharmacy. Patient called and message was left informing him they were sent in.

## 2014-02-09 NOTE — Telephone Encounter (Signed)
Pt called in stating that the pharmacy has not received a response from this office about having his plavix,isosorbide, and now he needs his vytorin 10/20 refilled as well. Please call  Thanks

## 2014-02-25 ENCOUNTER — Ambulatory Visit: Payer: Medicare Other | Admitting: Cardiovascular Disease

## 2014-03-09 ENCOUNTER — Encounter: Payer: Self-pay | Admitting: *Deleted

## 2014-03-13 ENCOUNTER — Encounter: Payer: Self-pay | Admitting: Cardiovascular Disease

## 2014-03-13 ENCOUNTER — Ambulatory Visit (INDEPENDENT_AMBULATORY_CARE_PROVIDER_SITE_OTHER): Payer: Medicare Other | Admitting: Cardiovascular Disease

## 2014-03-13 VITALS — BP 100/60 | Ht 69.5 in | Wt 175.7 lb

## 2014-03-13 DIAGNOSIS — I25118 Atherosclerotic heart disease of native coronary artery with other forms of angina pectoris: Secondary | ICD-10-CM | POA: Diagnosis not present

## 2014-03-13 DIAGNOSIS — G4733 Obstructive sleep apnea (adult) (pediatric): Secondary | ICD-10-CM

## 2014-03-13 DIAGNOSIS — I7300 Raynaud's syndrome without gangrene: Secondary | ICD-10-CM

## 2014-03-13 DIAGNOSIS — I209 Angina pectoris, unspecified: Secondary | ICD-10-CM | POA: Diagnosis not present

## 2014-03-13 DIAGNOSIS — E785 Hyperlipidemia, unspecified: Secondary | ICD-10-CM | POA: Diagnosis not present

## 2014-03-13 DIAGNOSIS — I2510 Atherosclerotic heart disease of native coronary artery without angina pectoris: Secondary | ICD-10-CM | POA: Diagnosis not present

## 2014-03-13 NOTE — Patient Instructions (Signed)
Your physician wants you to follow-up in: 6 months or sooner if needed. You will receive a reminder letter in the mail two months in advance. If you don't receive a letter, please call our office to schedule the follow-up appointment. No changes were made today in your therapy.

## 2014-03-13 NOTE — Progress Notes (Signed)
Patient ID: Anthony Petty, male   DOB: February 17, 1928, 80 y.o.   MRN: [id number]      HPI: Anthony Petty is a 80 y.o. male who presents for a 9 month follow-up cardiology evaluation.   Anthony Petty has remote history of bacterial endocarditis initially in 1980 and repeat in the 1990s. He has known coronary artery disease and in 2007 catheterization showed total occlusion of the circumflex vessel as well as high-grade AV groove circumflex and RCA disease for which he underwent stenting of his RCA and recanalization of a totally occluded circumflex and marginal system. Additional problems include paroxysmal atrial fibrillation. An echo Doppler study in August 2012 showed mild LVH with normal systolic function and grade 1 diastolic dysfunction. He had mild aortic sclerosis with mild-to-moderate aortic insufficiency, mild MR, mild TR and PR. There is mild probably hypertension with an estimated PA pressure 35 mm. His last stress test was in October 2013 prior to a trip to [place] when he had experienced some vague symptoms of chest pain. The nuclear perfusion study was unchanged and continue to show fairly normal perfusion. He also has a history of obstructive sleep apnea for which he sees Dr. Keturah Barre.  He has had issues of occasional palpitations, weakness and fatigue. His beta blocker dose was reduced from Lopressor 12.5 twice a day to 12.5 mg daily and last year had a CardioNet monitor.  CardioNet monitor had shown sinus rhythm with a 4 beat episode of PSVT. He also complained of "tingling "in the left upper chest region with inspiration. He was referred for nuclear perfusion study which was done last week. This revealed entirely normal perfusion without scar or ischemia. He now presents for evaluation.  He does feel improved on reduced dose of beta blocker;  he denies exertional chest pain. He denies shortness of breath. He denies presyncope or syncope.  When I last saw him in January the  patient also complained that his fingers turning white particularly with cold weather on a routine basis. He has to wear gloves the entire winter season and at times there may be transient red and blue discoloration.  I was concerned about the possibility of Raynaud's versus digital artery vasospasm and elected to start him on low-dose amlodipine at 2.5 mg.  This did improve some of his symptomatology.  Presently, he is without chest pain.  He denies palpitations.  He denies PND, or orthopnea.   Past Medical History  Diagnosis Date  . Hyperlipidemia   . Colon cancer 1987  . Coronary artery disease   . History of bacterial endocarditis 1980, 1990s  . OSA on CPAP     Past Surgical History  Procedure Laterality Date  . Knee surgery  2004    right knee  . Transthoracic echocardiogram  12/2010    EF=>55%, mild conc LVH; borderline LA enlargement; calcification of anterior MV leaflets, mild MR; mild TR; RVSP 30-85mHg; mild calcification of AV leaflets and mild-mod AV regurg; mild pulm regurg; aortic root sclerosis/calcification  . Nm myocar perf wall motion  02/2012    lexiscan - normal perfusion, EF 66%, low risk   . Cardiac catheterization  08/07/2005    40-50% narrowing in LAD, 30-40% scattered irregularity of diagonal, subtotal-total prox Cfx occlusion w/collaterals of marginal 1, prox 30-40% RCA narrowing, 40-50% distal RCA stenosis (Dr. TCorky Downs  . Coronary angioplasty with stent placement  08/15/2005    stent to RCA and prox Cfx with 3x25mDES to AV groove Cfx and 2.5x13 Cypher  DES to marginal (Dr. Corky Downs)  . Subtotal colectomy      in [place] r/t adenocarcinoma of colon     No Known Allergies  Current Outpatient Prescriptions  Medication Sig Dispense Refill  . amLODipine (NORVASC) 5 MG tablet Take 1/2 tablet daily.  30 tablet  6  . aspirin 81 MG tablet Take 81 mg by mouth daily.      . clopidogrel (PLAVIX) 75 MG tablet Take 1 tablet (75 mg total) by mouth daily.  90 tablet  0    . co-enzyme Q-10 30 MG capsule Take 30 mg by mouth daily.      Marland Kitchen ezetimibe-simvastatin (VYTORIN) 10-20 MG per tablet Take 1 tablet by mouth at bedtime.  90 tablet  0  . fish oil-omega-3 fatty acids 1000 MG capsule Take 2 g by mouth daily.      . isosorbide mononitrate (IMDUR) 30 MG 24 hr tablet Take 1 tablet (30 mg total) by mouth daily.  90 tablet  0  . metoprolol tartrate (LOPRESSOR) 25 MG tablet Take 12.5 mg by mouth 2 (two) times daily.      . nitroGLYCERIN (NITROSTAT) 0.4 MG SL tablet Place 1 tablet (0.4 mg total) under the tongue every 5 (five) minutes as needed for chest pain.  25 tablet  2  . NON FORMULARY CPAP therapy      . saw palmetto 160 MG capsule Take 160 mg by mouth daily.      . tamsulosin (FLOMAX) 0.4 MG CAPS capsule Take 1 capsule (0.4 mg total) by mouth daily.  90 capsule  3   No current facility-administered medications for this visit.    History   Social History  . Marital Status: Married    Spouse Name: N/A    Number of Children: N/A  . Years of Education: N/A   Occupational History  . Not on file.   Social History Main Topics  . Smoking status: Never Smoker   . Smokeless tobacco: Never Used  . Alcohol Use: Yes     Comment: ocassional glass of wine.  . Drug Use: Not on file  . Sexual Activity: Not on file   Other Topics Concern  . Not on file   Social History Narrative  . No narrative on file   Additional social history is notable that he is originally from [place]. He has 3 children 2 grandchildren. He does exercise. There is no alcohol or tobacco use.  History reviewed. No pertinent family history.  ROS General: Negative; No fevers, chills, or night sweats;  HEENT: Negative; No changes in vision or hearing, sinus congestion, difficulty swallowing Pulmonary: Negative; No cough, wheezing, shortness of breath, hemoptysis Cardiovascular: Negative; No chest pain, presyncope, syncope, palpitations GI: Negative; No nausea, vomiting, diarrhea, or  abdominal pain GU: Negative; No dysuria, hematuria, or difficulty voiding Musculoskeletal: Negative; no myalgias, joint pain, or weakness Hematologic/Oncology: Negative; no easy bruising, bleeding Endocrine: Negative; no heat/cold intolerance; no diabetes Neuro: Negative; no changes in balance, headaches Skin: Negative; No rashes or skin lesions Psychiatric: Negative; No behavioral problems, depression Sleep: Positive for obstructive sleep apnea, followed by Dr. Annamaria Boots; No snoring, daytime sleepiness, hypersomnolence, bruxism, restless legs, hypnogognic hallucinations, no cataplexy Other comprehensive 14 point system review is negative.   PE BP 100/60  Ht 5' 9.5" (1.765 m)  Wt 175 lb 11.2 oz (79.697 kg)  BMI 25.58 kg/m2  Repeat blood pressure by me was 110/70 General: Alert, oriented, no distress.  Skin: normal turgor, no rashes HEENT: Normocephalic,  atraumatic. Pupils round and reactive; sclera anicteric;no lid lag.  Nose without nasal septal hypertrophy Mouth/Parynx benign; Mallinpatti scale 3 Neck: No JVD, no carotid bruits with normal carotid upstroke Lungs: clear to ausculatation and percussion; no wheezing or rales Heart: RRR, s1 s2 normal 1/6 systolic murmur unchanged, with a whiff of AR Abdomen: soft, nontender; no hepatosplenomehaly, BS+; abdominal aorta nontender and not dilated by palpation. Back: No CVA tenderness Pulses 2+ Extremities: no clubbing cyanosis or edema, Homan's sign negative  Neurologic: grossly nonfocal Psychologic: normal affect and mood.  ECG (independently read by me): Sinus bradycardia 58 beats per minute.  Normal intervals.  No ST segment changes.  LABS:  BMET    Component Value Date/Time   NA 138 07/22/2007 0520   K 3.8 07/22/2007 0520   CL 106 07/22/2007 0520   CO2 25 07/22/2007 0520   GLUCOSE 120* 07/22/2007 0520   BUN 8 07/22/2007 0520   CREATININE 1.30 07/22/2007 0520   CALCIUM 9.1 07/22/2007 0520   GFRNONAA 53* 07/22/2007 0520   GFRAA   Value: >60        The eGFR has been calculated using the MDRD equation. This calculation has not been validated in all clinical 07/22/2007 0520     Hepatic Function Panel     Component Value Date/Time   PROT 6.4 07/21/2007 0503   ALBUMIN 3.2* 07/21/2007 0503   AST 67* 07/21/2007 0503   ALT 132* 07/21/2007 0503   ALKPHOS 74 07/21/2007 0503   BILITOT 2.0* 07/21/2007 0503     CBC    Component Value Date/Time   WBC 6.9 07/21/2007 0503   RBC 4.48 07/21/2007 0503   HGB 12.9* 07/21/2007 0503   HCT 38.1* 07/21/2007 0503   PLT 155 07/21/2007 0503   MCV 85.0 07/21/2007 0503   MCHC 33.9 07/21/2007 0503   RDW 14.1 07/21/2007 0503   LYMPHSABS 1.7 07/17/2007 1200   MONOABS 0.6 07/17/2007 1200   EOSABS 0.0 07/17/2007 1200   BASOSABS 0.0 07/17/2007 1200     BNP No results found for this basename: probnp    Lipid Panel  No results found for this basename: chol,  trig,  hdl,  cholhdl,  vldl,  ldlcalc     RADIOLOGY: No results found.    ASSESSMENT AND PLAN: Anthony Petty has a remote history of endocarditis x2 initially in 4 and subsequently into early 1990s. He is 8 years status post intervention to his RCA and recanalization of the circumflex system which was noted to be occluded with collateralization from the LAD to the OM prior to intervention. He has a 30x28 mm Cypher stent in the AV groove and 2.5x13 mm Cypher stent in the OM1 ostium. The RCA has a 3.0x23 mm Cypher stent in its midsegment. Anthony Petty  developed bradycardia  with pulse into the mid 40s on higher dose beta blocker.  He is tolerating his present dose at 12.5 mg twice a day.  He is not having any anginal symptoms with low-dose beta blocker and isosorbide, mononitrate.  He continues to be on dual antiplatelet therapy with aspirin 81 mg and Plavix 75 mg.  He is still on amlodipine, which was started for possible digital vasospasm, which also will be helpful for his coronary artery disease and improve blood flow.  He's not had any  symptoms in the warmer months of summer.  He is tolerating Vytorin for his hyperlipidemia.  He continues to use CPAP for his obstructive sleep apnea with 100% compliance.  As long  as he remains stable, I will see him in 6 months for reevaluation or sooner if problems intervene.   Troy Sine, MD, [facility]  03/13/2014 9:40 AM

## 2014-03-14 ENCOUNTER — Encounter: Payer: Self-pay | Admitting: Cardiovascular Disease

## 2014-03-16 DIAGNOSIS — Z23 Encounter for immunization: Secondary | ICD-10-CM | POA: Diagnosis not present

## 2014-04-02 ENCOUNTER — Other Ambulatory Visit: Payer: Self-pay | Admitting: Cardiovascular Disease

## 2014-04-03 NOTE — Telephone Encounter (Signed)
Rx refill sent to patient pharmacy   

## 2014-05-28 ENCOUNTER — Telehealth: Payer: Self-pay | Admitting: Cardiovascular Disease

## 2014-05-28 MED ORDER — CLOPIDOGREL BISULFATE 75 MG PO TABS: 75.0000 mg | ORAL_TABLET | Freq: Every day | ORAL | Status: DC

## 2014-05-28 MED ORDER — EZETIMIBE-SIMVASTATIN 10-20 MG PO TABS: 1.0000 | ORAL_TABLET | Freq: Every day | ORAL | Status: DC

## 2014-05-28 NOTE — Telephone Encounter (Signed)
Refill submitted. 

## 2014-05-28 NOTE — Telephone Encounter (Signed)
[name] is calling to get a new prescription faxed over to them for [name] 75mg  and Vytorin . Their fax numbner is [phone] .Marland Kitchen Please call if you have any questions    Thanks

## 2014-05-28 NOTE — Telephone Encounter (Signed)
Anthony Petty is switching Pharmacy .Marland Kitchen

## 2014-06-15 ENCOUNTER — Other Ambulatory Visit: Payer: Self-pay

## 2014-06-15 MED ORDER — AMLODIPINE BESYLATE 5 MG PO TABS: ORAL_TABLET | ORAL | Status: DC

## 2014-06-15 NOTE — Telephone Encounter (Signed)
Rx sent to pharmacy   

## 2014-06-16 ENCOUNTER — Other Ambulatory Visit: Payer: Self-pay | Admitting: *Deleted

## 2014-06-16 MED ORDER — AMLODIPINE BESYLATE 5 MG PO TABS: ORAL_TABLET | ORAL | Status: DC

## 2014-06-16 NOTE — Telephone Encounter (Signed)
Paper Rx refilled.

## 2014-07-03 ENCOUNTER — Telehealth: Payer: Self-pay | Admitting: Cardiovascular Disease

## 2014-07-03 MED ORDER — AMLODIPINE BESYLATE 5 MG PO TABS: ORAL_TABLET | ORAL | Status: DC

## 2014-07-03 NOTE — Telephone Encounter (Signed)
Anthony Petty is calling because he has some questions about his medications . Please call    Thanks

## 2014-07-03 NOTE — Telephone Encounter (Signed)
Attempted to call x2. Left message on machine to return call.

## 2014-07-03 NOTE — Telephone Encounter (Signed)
Submitted refill for 90 day supply. Left message on machine for patient that this was sent.

## 2014-07-03 NOTE — Telephone Encounter (Signed)
Pt is calling in stating that he only received 15 pills for his Amlodipine and he normally recieves 45. Please call  Thanks

## 2014-07-07 ENCOUNTER — Telehealth: Payer: Self-pay | Admitting: Cardiovascular Disease

## 2014-07-08 NOTE — Telephone Encounter (Signed)
Closed encounter °

## 2014-08-28 DIAGNOSIS — L570 Actinic keratosis: Secondary | ICD-10-CM | POA: Diagnosis not present

## 2014-08-28 DIAGNOSIS — L820 Inflamed seborrheic keratosis: Secondary | ICD-10-CM | POA: Diagnosis not present

## 2014-08-28 DIAGNOSIS — L814 Other melanin hyperpigmentation: Secondary | ICD-10-CM | POA: Diagnosis not present

## 2014-09-02 ENCOUNTER — Other Ambulatory Visit: Payer: Self-pay | Admitting: Cardiovascular Disease

## 2014-09-02 MED ORDER — EZETIMIBE-SIMVASTATIN 10-20 MG PO TABS
1.0000 | ORAL_TABLET | Freq: Every day | ORAL | Status: DC
Start: 2014-09-02 — End: 2015-03-17

## 2014-09-02 NOTE — Telephone Encounter (Signed)
Rx(s) sent to pharmacy electronically. Patient notified. 

## 2014-09-02 NOTE — Telephone Encounter (Signed)
°  1. Which medications need to be refilled? Vytorin 10-20mg   2. Which pharmacy is medication to be sent to?[facility]    3. Do they need a 30 day or 90 day supply? 90  4. Would they like a call back once the medication has been sent to the pharmacy? Yes

## 2014-09-03 DIAGNOSIS — M1712 Unilateral primary osteoarthritis, left knee: Secondary | ICD-10-CM | POA: Diagnosis not present

## 2014-09-03 DIAGNOSIS — M2242 Chondromalacia patellae, left knee: Secondary | ICD-10-CM | POA: Diagnosis not present

## 2014-09-03 DIAGNOSIS — Z6824 Body mass index (BMI) 24.0-24.9, adult: Secondary | ICD-10-CM | POA: Diagnosis not present

## 2014-09-10 ENCOUNTER — Ambulatory Visit (INDEPENDENT_AMBULATORY_CARE_PROVIDER_SITE_OTHER): Payer: Medicare Other | Admitting: Cardiovascular Disease

## 2014-09-10 ENCOUNTER — Encounter: Payer: Self-pay | Admitting: Cardiovascular Disease

## 2014-09-10 VITALS — BP 130/60 | HR 51 | Ht 68.5 in | Wt 169.1 lb

## 2014-09-10 DIAGNOSIS — R002 Palpitations: Secondary | ICD-10-CM | POA: Diagnosis not present

## 2014-09-10 DIAGNOSIS — I25118 Atherosclerotic heart disease of native coronary artery with other forms of angina pectoris: Secondary | ICD-10-CM

## 2014-09-10 DIAGNOSIS — E785 Hyperlipidemia, unspecified: Secondary | ICD-10-CM | POA: Diagnosis not present

## 2014-09-10 DIAGNOSIS — R001 Bradycardia, unspecified: Secondary | ICD-10-CM

## 2014-09-10 DIAGNOSIS — G4733 Obstructive sleep apnea (adult) (pediatric): Secondary | ICD-10-CM

## 2014-09-10 DIAGNOSIS — I7300 Raynaud's syndrome without gangrene: Secondary | ICD-10-CM | POA: Diagnosis not present

## 2014-09-10 NOTE — Patient Instructions (Signed)
Your physician wants you to follow-up in: 6 months or sooner if needed. You will receive a reminder letter in the mail two months in advance. If you don't receive a letter, please call our office to schedule the follow-up appointment. 

## 2014-09-11 ENCOUNTER — Encounter: Payer: Self-pay | Admitting: Cardiovascular Disease

## 2014-09-11 DIAGNOSIS — R3912 Poor urinary stream: Secondary | ICD-10-CM | POA: Diagnosis not present

## 2014-09-11 DIAGNOSIS — R972 Elevated prostate specific antigen [PSA]: Secondary | ICD-10-CM | POA: Diagnosis not present

## 2014-09-11 DIAGNOSIS — R3915 Urgency of urination: Secondary | ICD-10-CM | POA: Diagnosis not present

## 2014-09-11 NOTE — Progress Notes (Signed)
Patient ID: Anthony Petty, male   DOB: [birth year], 81 y.o.   MRN: [id number]     HPI: Anthony Petty is a 81 y.o. male who presents for a 6 month follow-up cardiology evaluation.   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 He has known CAD and in 2007 catheterization showed total occlusion of the circumflex vessel as well as high-grade AV groove circumflex and RCA disease for which he underwent stenting of his RCA and recanalization of a totally occluded circumflex and marginal system. Additional problems include paroxysmal atrial fibrillation. An echo Doppler study in August 2012 showed mild LVH with normal systolic function and grade 1 diastolic dysfunction. He had mild aortic sclerosis with mild-to-moderate aortic insufficiency, mild MR, mild TR and PR. There is mild pulmonary hypertension with an estimated PA pressure 35 mm. His last stress test was in October 2013 prior to a trip to [place] when he had experienced some vague symptoms of chest pain. The nuclear perfusion study was unchanged and continued to show fairly normal perfusion. He also has a history of obstructive sleep apnea for which he sees Dr. Keturah Barre.  He has had issues of occasional palpitations, weakness and fatigue. His beta blocker dose was reduced from Lopressor 12.5 twice a day to 12.5 mg daily and last year had a CardioNet monitor.  CardioNet monitor demonstrated a 4 beat episode of PSVT. He also complained of "tingling "in the left upper chest region with inspiration.  He underwent a follow-up nuclear study which continued to reveal normal perfusion without scar or ischemia.   When I last saw him last year he also complained that his fingers turning white particularly with cold weather on a routine basis. He has to wear gloves the entire winter season and at times there may be transient red and blue discoloration.  I was concerned about the possibility of Raynaud's  versus digital artery vasospasm and elected to start him on low-dose amlodipine at 2.5 mg.  This did improve some of his symptomatology.  As I last saw him, he denies any episodes of chest pain.  He is unaware of palpitations.  He denies presyncope or syncope.  He continues to use CPAP with 100% compliance.  He presents for evaluation  Past Medical History  Diagnosis Date  . Hyperlipidemia   . Colon cancer 1987  . Coronary artery disease   . History of bacterial endocarditis 1980, 1990s  . OSA on CPAP     Past Surgical History  Procedure Laterality Date  . Knee surgery  2004    right knee  . Transthoracic echocardiogram  12/2010    EF=>55%, mild conc LVH; borderline LA enlargement; calcification of anterior MV leaflets, mild MR; mild TR; RVSP 30-68mmHg; mild calcification of AV leaflets and mild-mod AV regurg; mild pulm regurg; aortic root sclerosis/calcification  . Nm myocar perf wall motion  02/2012    lexiscan - normal perfusion, EF 66%, low risk   . Cardiac catheterization  08/07/2005    40-50% narrowing in LAD, 30-40% scattered irregularity of diagonal, subtotal-total prox Cfx occlusion w/collaterals of marginal 1, prox 30-40% RCA narrowing, 40-50% distal RCA stenosis (Dr. Corky Downs)  . Coronary angioplasty with stent placement  08/15/2005    stent to RCA and prox Cfx with 3x23mm DES to AV groove Cfx and 2.5x13 Cypher DES to marginal (Dr. Corky Downs)  . Subtotal colectomy      in [place] r/t adenocarcinoma of colon  No Known Allergies  Current Outpatient Prescriptions  Medication Sig Dispense Refill  . amLODipine (NORVASC) 5 MG tablet Take 1/2 tablet daily. 45 tablet 1  . aspirin 81 MG tablet Take 81 mg by mouth daily.    . clopidogrel (PLAVIX) 75 MG tablet Take 1 tablet (75 mg total) by mouth daily. 90 tablet 2  . co-enzyme Q-10 30 MG capsule Take 30 mg by mouth daily.    Marland Kitchen ezetimibe-simvastatin (VYTORIN) 10-20 MG per tablet Take 1 tablet by mouth at bedtime. 90 tablet 2  .  fish oil-omega-3 fatty acids 1000 MG capsule Take 2 g by mouth daily.    . isosorbide mononitrate (IMDUR) 30 MG 24 hr tablet Take 1 tablet (30 mg total) by mouth daily. 90 tablet 0  . metoprolol tartrate (LOPRESSOR) 25 MG tablet Take 12.5 mg by mouth daily.     . nitroGLYCERIN (NITROSTAT) 0.4 MG SL tablet Place 1 tablet (0.4 mg total) under the tongue every 5 (five) minutes as needed for chest pain. 25 tablet 2  . NON FORMULARY CPAP therapy    . saw palmetto 160 MG capsule Take 160 mg by mouth daily.    . tamsulosin (FLOMAX) 0.4 MG CAPS capsule Take 1 capsule (0.4 mg total) by mouth daily. 90 capsule 3   No current facility-administered medications for this visit.    History   Social History  . Marital Status: Married    Spouse Name: N/A  . Number of Children: N/A  . Years of Education: N/A   Occupational History  . Not on file.   Social History Main Topics  . Smoking status: Never Smoker   . Smokeless tobacco: Never Used  . Alcohol Use: 0.0 oz/week    0 Standard drinks or equivalent per week     Comment: ocassional glass of wine.  . Drug Use: Not on file  . Sexual Activity: Not on file   Other Topics Concern  . Not on file   Social History Narrative   Additional social history is notable that he is originally from [place]. He has 3 children 2 grandchildren. He does exercise. There is no alcohol or tobacco use.  History reviewed. No pertinent family history.  ROS General: Negative; No fevers, chills, or night sweats;  HEENT: Negative; No changes in vision or hearing, sinus congestion, difficulty swallowing Pulmonary: Negative; No cough, wheezing, shortness of breath, hemoptysis Cardiovascular: Negative; No chest pain, presyncope, syncope, palpitations GI: Negative; No nausea, vomiting, diarrhea, or abdominal pain GU: Negative; No dysuria, hematuria, or difficulty voiding Musculoskeletal: Negative; no myalgias, joint pain, or weakness Hematologic/Oncology: Negative; no  easy bruising, bleeding Endocrine: Negative; no heat/cold intolerance; no diabetes Neuro: Negative; no changes in balance, headaches Skin: Negative; No rashes or skin lesions Psychiatric: Negative; No behavioral problems, depression Sleep: Positive for obstructive sleep apnea, followed by Dr. Annamaria Boots; No snoring, daytime sleepiness, hypersomnolence, bruxism, restless legs, hypnogognic hallucinations, no cataplexy Other comprehensive 14 point system review is negative.   PE BP 130/60 mmHg  Pulse 51  Ht 5' 8.5" (1.74 m)  Wt 169 lb 1.6 oz (76.703 kg)  BMI 25.33 kg/m2  Repeat blood pressure by me was 122/64 General: Alert, oriented, no distress.  Skin: normal turgor, no rashes HEENT: Normocephalic, atraumatic. Pupils round and reactive; sclera anicteric;no lid lag.  Nose without nasal septal hypertrophy Mouth/Parynx benign; Mallinpatti scale 3 Neck: No JVD, no carotid bruits with normal carotid upstroke Lungs: clear to ausculatation and percussion; no wheezing or rales Heart: RRR, s1 s2  normal 1/6 systolic murmur unchanged, with a whiff of AR; no rubs thrills or heaves Abdomen: soft, nontender; no hepatosplenomehaly, BS+; abdominal aorta nontender and not dilated by palpation. Back: No CVA tenderness Pulses 2+ Extremities: no clubbing cyanosis or edema, Homan's sign negative  Neurologic: grossly nonfocal Psychologic: normal affect and mood.  ECG (independently read by me): Sinus bradycardia at 51 bpm.  No ectopy.  Normal intervals.  October 2015 ECG (independently read by me): Sinus bradycardia 58 beats per minute.  Normal intervals.  No ST segment changes.  LABS:  He is followed by Dr. Reynaldo Minium who checks his laboratory at [facility].  I do not have copies of his most recent labs.   BMP 07/22/2007 07/21/2007 07/20/2007  Glucose 120(H) 112(H) 90  BUN 8 7 5(L)  Creatinine 1.30 1.25 1.20  Sodium 138 137 141  Potassium 3.8 3.9 4.0  Chloride 106 106 108  CO2 25 25 24   Calcium  9.1 9.1 9.3    Hepatic Function Panel     Component Value Date/Time   PROT 6.4 07/21/2007 0503   ALBUMIN 3.2* 07/21/2007 0503   AST 67* 07/21/2007 0503   ALT 132* 07/21/2007 0503   ALKPHOS 74 07/21/2007 0503   BILITOT 2.0* 07/21/2007 0503     CBC    Component Value Date/Time   WBC 6.9 07/21/2007 0503   RBC 4.48 07/21/2007 0503   HGB 12.9* 07/21/2007 0503   HCT 38.1* 07/21/2007 0503   PLT 155 07/21/2007 0503   MCV 85.0 07/21/2007 0503   MCHC 33.9 07/21/2007 0503   RDW 14.1 07/21/2007 0503   LYMPHSABS 1.7 07/17/2007 1200   MONOABS 0.6 07/17/2007 1200   EOSABS 0.0 07/17/2007 1200   BASOSABS 0.0 07/17/2007 1200     BNP No results found for: PROBNP  Lipid Panel  No results found for: CHOL   RADIOLOGY: No results found.    ASSESSMENT AND PLAN: Anthony Petty is an 81 year old gentleman who has a remote history of endocarditis x2 initially in 16 and subsequently into early 1990s. He is 9 years status post intervention to his RCA and recanalization of the circumflex system which was noted to be occluded with collateralization from the LAD to the OM prior to intervention. He has a 30x28 mm Cypher stent in the AV groove and 2.5x13 mm Cypher stent in the OM1 ostium. The RCA has a 3.0x23 mm Cypher stent in its midsegment.  He had developed bradycardia in the past on higher dose metoprolol but currently is just taking metoprolol, tartrate 12.5 mg once a day.  With this.  His resting pulse remains in the low 50s and he is asymptomatic.  He denies any recurrent anginal symptomatology and continues to take isosorbide mononitrate 30 mm daily in addition to his low-dose beta blocker.  He is on Vytorin 10/20 for hyperlipidemia with target LDL less than 70.  He is on amlodipine 2.5 mg, which also was helpful for blood pressure and for possible ray not phenomenon.  He continues to take fish oil omega-3 fatty acids.  He continues to use CPAP with 100% compliance.  Dr. Reynaldo Minium.  Will be  rechecking his laboratory in the near future no last that these be 40 to me for my review.  As long as he remains stable I will see him in 6 months for reevaluation.  Troy Sine, MD, [facility]  09/11/2014 7:45 AM

## 2014-09-28 DIAGNOSIS — Z6825 Body mass index (BMI) 25.0-25.9, adult: Secondary | ICD-10-CM | POA: Diagnosis not present

## 2014-09-28 DIAGNOSIS — H00013 Hordeolum externum right eye, unspecified eyelid: Secondary | ICD-10-CM | POA: Diagnosis not present

## 2014-09-28 DIAGNOSIS — J3089 Other allergic rhinitis: Secondary | ICD-10-CM | POA: Diagnosis not present

## 2014-09-28 DIAGNOSIS — H6981 Other specified disorders of Eustachian tube, right ear: Secondary | ICD-10-CM | POA: Diagnosis not present

## 2014-10-02 DIAGNOSIS — L578 Other skin changes due to chronic exposure to nonionizing radiation: Secondary | ICD-10-CM | POA: Diagnosis not present

## 2014-10-02 DIAGNOSIS — L814 Other melanin hyperpigmentation: Secondary | ICD-10-CM | POA: Diagnosis not present

## 2014-10-02 DIAGNOSIS — L820 Inflamed seborrheic keratosis: Secondary | ICD-10-CM | POA: Diagnosis not present

## 2014-10-16 ENCOUNTER — Other Ambulatory Visit: Payer: Self-pay | Admitting: Cardiovascular Disease

## 2014-10-16 ENCOUNTER — Telehealth: Payer: Self-pay | Admitting: Cardiovascular Disease

## 2014-10-16 MED ORDER — AMLODIPINE BESYLATE 5 MG PO TABS: ORAL_TABLET | ORAL | Status: DC

## 2014-10-16 NOTE — Telephone Encounter (Signed)
°  1. Which medications need to be refilled? Amlodipine-need new prescription, switch pharmacy2. Which pharmacy is medication to be sent to? [facility] [phone]  3. Do they need a 30 day or 90 day supply? 90 and refills  4. Would they like a call back once the medication has been sent to the pharmacy? yes

## 2014-10-16 NOTE — Telephone Encounter (Signed)
Returned call to patient spoke to wife amlodipine refill sent to pharmacy.

## 2014-11-02 ENCOUNTER — Telehealth: Payer: Self-pay | Admitting: Cardiovascular Disease

## 2014-11-02 MED ORDER — METOPROLOL TARTRATE 25 MG PO TABS: 12.5000 mg | ORAL_TABLET | Freq: Every day | ORAL | Status: DC

## 2014-11-02 NOTE — Telephone Encounter (Signed)
°  1. Which medications need to be refilled? Metoprolol 2. Which pharmacy is medication to be sent to?[facility]-[phone]  3. Do they need a 30 day or 90 day supply? 90 days and refills  4. Would they like a call back once the medication has been sent to the pharmacy? yes

## 2014-11-02 NOTE — Telephone Encounter (Signed)
Rx(s) sent to pharmacy electronically. Patient notified. 

## 2014-11-05 DIAGNOSIS — J3089 Other allergic rhinitis: Secondary | ICD-10-CM | POA: Diagnosis not present

## 2014-11-05 DIAGNOSIS — J301 Allergic rhinitis due to pollen: Secondary | ICD-10-CM | POA: Diagnosis not present

## 2014-11-16 DIAGNOSIS — J301 Allergic rhinitis due to pollen: Secondary | ICD-10-CM | POA: Diagnosis not present

## 2014-11-19 ENCOUNTER — Other Ambulatory Visit: Payer: Self-pay | Admitting: Cardiovascular Disease

## 2014-11-25 ENCOUNTER — Telehealth: Payer: Self-pay | Admitting: Cardiovascular Disease

## 2014-11-25 MED ORDER — ISOSORBIDE MONONITRATE ER 30 MG PO TB24: ORAL_TABLET | ORAL | Status: DC

## 2014-11-25 NOTE — Telephone Encounter (Signed)
°  1. Which medications need to be refilled? Isosorbide-only received 30 last time,he wants 90-please call this in asap please.  2. Which pharmacy is medication to be sent to?[facility] [phone]  3. Do they need a 30 day or 90 day supply? -need 60 this time and 90 for remainder of refills  4. Would they like a call back once the medication has been sent to the pharmacy? yes

## 2014-11-25 NOTE — Telephone Encounter (Signed)
Spoke to pharmacist Brain   new  Verbal order given for 90 day supply x 3 refills  patient will be able to receive 60 more tablets to equal a total of 90 tablets.     RN notified patient. patient verbalized understanding

## 2015-01-01 DIAGNOSIS — N3001 Acute cystitis with hematuria: Secondary | ICD-10-CM | POA: Diagnosis not present

## 2015-01-01 DIAGNOSIS — R300 Dysuria: Secondary | ICD-10-CM | POA: Diagnosis not present

## 2015-01-21 DIAGNOSIS — L719 Rosacea, unspecified: Secondary | ICD-10-CM | POA: Diagnosis not present

## 2015-03-17 ENCOUNTER — Other Ambulatory Visit: Payer: Self-pay | Admitting: Cardiovascular Disease

## 2015-03-17 DIAGNOSIS — Z125 Encounter for screening for malignant neoplasm of prostate: Secondary | ICD-10-CM | POA: Diagnosis not present

## 2015-03-17 DIAGNOSIS — E785 Hyperlipidemia, unspecified: Secondary | ICD-10-CM | POA: Diagnosis not present

## 2015-03-17 DIAGNOSIS — I10 Essential (primary) hypertension: Secondary | ICD-10-CM | POA: Diagnosis not present

## 2015-03-18 ENCOUNTER — Telehealth: Payer: Self-pay | Admitting: Cardiovascular Disease

## 2015-03-18 NOTE — Telephone Encounter (Signed)
Spoke to mrs. Heidel.  She reports pt has been having "wiggly" chest pain, very brief, intermittent pains that seem to occur all over (right side most recently, has been midsternal and left sided as well). As frequent as 1-2 times a day. No SOB, lightheadedness, neck pain, arm pain or other symptoms. She states pt describes as "wiggly" or as "twinges". She reports patient is not bothered by these, but she felt a little concerned and since, as she reports, they have been under "quite a bit of stress" recently, she is aware that this could be component to that. She wanted to get advice, hence the call.  I inquired about Nitro use - he has not felt need to take any, unconcerned for the symptoms. Advised her I would route to [facility] for any additional considerations - that if he develops worrisome symptoms such as those discussed to seek ER eval.  O/w, his next appt is 11/2 w/ Dr. [name] for 6 month f/u. Wife states she would rather not worry pt about her call, so unless further advice recommended, she will plan just to bring him to the 11/2 appt as scheduled.

## 2015-03-18 NOTE — Telephone Encounter (Signed)
Pt's wife called in wanting to speak with a nurse about some strange pains he is having on the left side of his chest. Please f/u with her  Thanks

## 2015-03-24 DIAGNOSIS — N401 Enlarged prostate with lower urinary tract symptoms: Secondary | ICD-10-CM | POA: Diagnosis not present

## 2015-03-24 DIAGNOSIS — I10 Essential (primary) hypertension: Secondary | ICD-10-CM | POA: Diagnosis not present

## 2015-03-24 DIAGNOSIS — M1990 Unspecified osteoarthritis, unspecified site: Secondary | ICD-10-CM | POA: Diagnosis not present

## 2015-03-24 DIAGNOSIS — E785 Hyperlipidemia, unspecified: Secondary | ICD-10-CM | POA: Diagnosis not present

## 2015-03-24 DIAGNOSIS — Z0000 Encounter for general adult medical examination without abnormal findings: Secondary | ICD-10-CM | POA: Diagnosis not present

## 2015-03-25 DIAGNOSIS — Z1212 Encounter for screening for malignant neoplasm of rectum: Secondary | ICD-10-CM | POA: Diagnosis not present

## 2015-03-26 NOTE — Telephone Encounter (Signed)
Patient has appointment with [facility] 03/31/15 at 9:30 am.

## 2015-03-26 NOTE — Telephone Encounter (Signed)
Can this encounter be closed?

## 2015-03-31 ENCOUNTER — Encounter: Payer: Self-pay | Admitting: Cardiovascular Disease

## 2015-03-31 ENCOUNTER — Ambulatory Visit (INDEPENDENT_AMBULATORY_CARE_PROVIDER_SITE_OTHER): Payer: Medicare Other | Admitting: Cardiovascular Disease

## 2015-03-31 VITALS — BP 102/66 | HR 48 | Ht 69.0 in | Wt 172.0 lb

## 2015-03-31 DIAGNOSIS — R001 Bradycardia, unspecified: Secondary | ICD-10-CM

## 2015-03-31 DIAGNOSIS — I25810 Atherosclerosis of coronary artery bypass graft(s) without angina pectoris: Secondary | ICD-10-CM | POA: Diagnosis not present

## 2015-03-31 DIAGNOSIS — R002 Palpitations: Secondary | ICD-10-CM | POA: Diagnosis not present

## 2015-03-31 DIAGNOSIS — G4733 Obstructive sleep apnea (adult) (pediatric): Secondary | ICD-10-CM

## 2015-03-31 DIAGNOSIS — I2510 Atherosclerotic heart disease of native coronary artery without angina pectoris: Secondary | ICD-10-CM

## 2015-03-31 DIAGNOSIS — I7300 Raynaud's syndrome without gangrene: Secondary | ICD-10-CM | POA: Diagnosis not present

## 2015-03-31 DIAGNOSIS — E785 Hyperlipidemia, unspecified: Secondary | ICD-10-CM

## 2015-03-31 NOTE — Patient Instructions (Signed)
Your physician has recommended you make the following change in your medication: decrease and stop  The lopressor as directed by Dr. [name].  Your physician recommends that you schedule a follow-up appointment in: 2 months with Dr. [name].

## 2015-03-31 NOTE — Progress Notes (Signed)
Patient ID: Anthony Petty, male   DOB: 04-Jun-1927, 81 y.o.   MRN: [id number]   Primary M.D.: Dr. Reynaldo Minium  HPI: Anthony Petty is a 81 y.o. male who presents for a 6 month follow-up cardiology evaluation.   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 He has known CAD and cardiac catheterization in 2007 showed total occlusion of the circumflex vessel as well as high-grade AV groove circumflex and RCA disease for which he underwent stenting of his RCA and recanalization of a totally occluded circumflex and marginal system. Additional problems include paroxysmal atrial fibrillation. An echo Doppler study in August 2012 showed mild LVH with normal systolic function and grade 1 diastolic dysfunction. He had mild aortic sclerosis with mild-to-moderate aortic insufficiency, mild MR, mild TR and PR. There is mild pulmonary hypertension with an estimated PA pressure 35 mm. His last stress test was in October 2013 prior to a trip to [place] when he had experienced some vague symptoms of chest pain. The nuclear perfusion study was unchanged and continued to show fairly normal perfusion. He also has a history of obstructive sleep apnea for which he sees Dr. Keturah Barre.  He has had issues of occasional palpitations, weakness and fatigue. His beta blocker dose was reduced from Lopressor 12.5 twice a day to 12.5 mg daily and last year had a CardioNet monitor.  CardioNet monitor demonstrated a 4 beat episode of PSVT. He also complained of "tingling "in the left upper chest region with inspiration.  He underwent a follow-up nuclear study which continued to reveal normal perfusion without scar or ischemia.   When I saw him last year he also complained that his fingers turning white particularly with cold weather on a routine basis. He has to wear gloves the entire winter season and at times there may be transient red and blue discoloration.  I was concerned about  the possibility of Raynaud's versus digital artery vasospasm and elected to start him on low-dose amlodipine at 2.5 mg.  This did improve some of his symptomatology.  Since I last saw him, he denies any episodes of chest pain.  He is unaware of palpitations.  He denies presyncope or syncope.  He continues to use CPAP with 100% compliance.  He recently saw Dr. Reynaldo Minium who checked complete blood work from the patient was told that his labs were normal with the exception of his  PSA which was elevated.  He was given a new medication since he was complaining of some fatigability.  He presents for evaluation  Past Medical History  Diagnosis Date  . Hyperlipidemia   . Colon cancer ([place]) 1987  . Coronary artery disease   . History of bacterial endocarditis 1980, 1990s  . OSA on CPAP     Past Surgical History  Procedure Laterality Date  . Knee surgery  2004    right knee  . Transthoracic echocardiogram  12/2010    EF=>55%, mild conc LVH; borderline LA enlargement; calcification of anterior MV leaflets, mild MR; mild TR; RVSP 30-55mmHg; mild calcification of AV leaflets and mild-mod AV regurg; mild pulm regurg; aortic root sclerosis/calcification  . Nm myocar perf wall motion  02/2012    lexiscan - normal perfusion, EF 66%, low risk   . Cardiac catheterization  08/07/2005    40-50% narrowing in LAD, 30-40% scattered irregularity of diagonal, subtotal-total prox Cfx occlusion w/collaterals of marginal 1, prox 30-40% RCA narrowing, 40-50% distal RCA stenosis (Dr. Corky Downs)  .  Coronary angioplasty with stent placement  08/15/2005    stent to RCA and prox Cfx with 3x84mm DES to AV groove Cfx and 2.5x13 Cypher DES to marginal (Dr. Corky Downs)  . Subtotal colectomy      in [place] r/t adenocarcinoma of colon     No Known Allergies  Current Outpatient Prescriptions  Medication Sig Dispense Refill  . amLODipine (NORVASC) 5 MG tablet Take 1/2 tablet daily. 45 tablet 3  . aspirin 81 MG tablet Take 81 mg by  mouth daily.    . Cholecalciferol (VITAMIN D3) 400 UNITS CAPS Take 1 capsule by mouth daily.    . clopidogrel (PLAVIX) 75 MG tablet TAKE ONE (1) TABLET BY MOUTH EVERY DAY 90 tablet 3  . co-enzyme Q-10 30 MG capsule Take 30 mg by mouth daily.    . fish oil-omega-3 fatty acids 1000 MG capsule Take 2 g by mouth daily.    . isosorbide mononitrate (IMDUR) 30 MG 24 hr tablet TAKE ONE (1) TABLET BY MOUTH EVERY DAY 90 tablet 3  . metoprolol tartrate (LOPRESSOR) 25 MG tablet Take 0.5 tablets (12.5 mg total) by mouth daily. 45 tablet 3  . nitroGLYCERIN (NITROSTAT) 0.4 MG SL tablet Place 1 tablet (0.4 mg total) under the tongue every 5 (five) minutes as needed for chest pain. 25 tablet 2  . NON FORMULARY CPAP therapy    . saw palmetto 160 MG capsule Take 160 mg by mouth daily.    . tamsulosin (FLOMAX) 0.4 MG CAPS capsule Take 1 capsule (0.4 mg total) by mouth daily. 90 capsule 3  . VYTORIN 10-20 MG tablet TAKE ONE TABLET BY MOUTH EVERY NIGHT AT BEDTIME 90 tablet 1   No current facility-administered medications for this visit.    Social History   Social History  . Marital Status: Married    Spouse Name: N/A  . Number of Children: N/A  . Years of Education: N/A   Occupational History  . Not on file.   Social History Main Topics  . Smoking status: Never Smoker   . Smokeless tobacco: Never Used  . Alcohol Use: 0.0 oz/week    0 Standard drinks or equivalent per week     Comment: ocassional glass of wine.  . Drug Use: Not on file  . Sexual Activity: Not on file   Other Topics Concern  . Not on file   Social History Narrative   Additional social history is notable that he is originally from [place]. He has 3 children 2 grandchildren. He does exercise. There is no alcohol or tobacco use.  No family history on file.  ROS General: Negative; No fevers, chills, or night sweats;  HEENT: Negative; No changes in vision or hearing, sinus congestion, difficulty swallowing Pulmonary: Negative; No  cough, wheezing, shortness of breath, hemoptysis Cardiovascular: Negative; No chest pain, presyncope, syncope, palpitations GI: Negative; No nausea, vomiting, diarrhea, or abdominal pain GU: Negative; No dysuria, hematuria, or difficulty voiding Musculoskeletal: Negative; no myalgias, joint pain, or weakness Hematologic/Oncology: Negative; no easy bruising, bleeding Endocrine: Negative; no heat/cold intolerance; no diabetes Neuro: Negative; no changes in balance, headaches Skin: Negative; No rashes or skin lesions Psychiatric: Negative; No behavioral problems, depression Sleep: Positive for obstructive sleep apnea, followed by Dr. Annamaria Boots; No snoring, daytime sleepiness, hypersomnolence, bruxism, restless legs, hypnogognic hallucinations, no cataplexy Other comprehensive 14 point system review is negative.   PE BP 102/66 mmHg  Pulse 48  Ht 5\' 9"  (1.753 m)  Wt 172 lb (78.019 kg)  BMI 25.39  kg/m2  Repeat blood pressure by me was 120/68  Wt Readings from Last 3 Encounters:  03/31/15 172 lb (78.019 kg)  09/10/14 169 lb 1.6 oz (76.703 kg)  03/13/14 175 lb 11.2 oz (79.697 kg)   General: Alert, oriented, no distress.  Skin: normal turgor, no rashes HEENT: Normocephalic, atraumatic. Pupils round and reactive; sclera anicteric;no lid lag.  Nose without nasal septal hypertrophy Mouth/Parynx benign; Mallinpatti scale 3 Neck: No JVD, no carotid bruits with normal carotid upstroke Lungs: clear to ausculatation and percussion; no wheezing or rales Heart: RRR, s1 s2 normal 1/6 systolic murmur unchanged, with a whiff of AR; no rubs thrills or heaves Abdomen: soft, nontender; no hepatosplenomehaly, BS+; abdominal aorta nontender and not dilated by palpation. Back: No CVA tenderness Pulses 2+ Extremities: no clubbing cyanosis or edema, Homan's sign negative  Neurologic: grossly nonfocal Psychologic: normal affect and mood.  ECG (independently read by me): Sinus bradycardia at 48 bpm.  ECG  (independently read by me): Sinus bradycardia at 51 bpm.  No ectopy.  Normal intervals.  October 2015 ECG (independently read by me): Sinus bradycardia 58 beats per minute.  Normal intervals.  No ST segment changes.  LABS:  He is followed by Dr. Reynaldo Minium who checks his laboratory at [place].  I do not have copies of his most recent labs.   BMP 07/22/2007 07/21/2007 07/20/2007  Glucose 120(H) 112(H) 90  BUN 8 7 5(L)  Creatinine 1.30 1.25 1.20  Sodium 138 137 141  Potassium 3.8 3.9 4.0  Chloride 106 106 108  CO2 25 25 24   Calcium 9.1 9.1 9.3    Hepatic Function Panel     Component Value Date/Time   PROT 6.4 07/21/2007 0503   ALBUMIN 3.2* 07/21/2007 0503   AST 67* 07/21/2007 0503   ALT 132* 07/21/2007 0503   ALKPHOS 74 07/21/2007 0503   BILITOT 2.0* 07/21/2007 0503     CBC    Component Value Date/Time   WBC 6.9 07/21/2007 0503   RBC 4.48 07/21/2007 0503   HGB 12.9* 07/21/2007 0503   HCT 38.1* 07/21/2007 0503   PLT 155 07/21/2007 0503   MCV 85.0 07/21/2007 0503   MCHC 33.9 07/21/2007 0503   RDW 14.1 07/21/2007 0503   LYMPHSABS 1.7 07/17/2007 1200   MONOABS 0.6 07/17/2007 1200   EOSABS 0.0 07/17/2007 1200   BASOSABS 0.0 07/17/2007 1200     BNP No results found for: PROBNP  Lipid Panel  No results found for: CHOL   RADIOLOGY: No results found.    ASSESSMENT AND PLAN: Anthony Petty is an 81 year old gentleman who has a remote history of endocarditis x2 initially in 69 and subsequently into early 1990s. He is 9 years status post intervention to his RCA and recanalization of the circumflex system which was noted to be occluded with collateralization from the LAD to the OM prior to intervention. He has a 30x28 mm Cypher stent in the AV groove and 2.5x13 mm Cypher stent in the OM1 ostium. The RCA has a 3.0x23 mm Cypher stent in its midsegment.  Over the last several years, his resting pulse has gotten somewhat slower.  His dose of metoprolol, tartrate had  been reduced to just 12.5 mg daily.  His resting pulse today is 48.  I suspect he is developing sinus node dysfunction.  For the next 2 days he will reduce his metoprolol to 6.25 mg, then skip a day and take one additional dose the next day in attempt to wean and discontinue this  medication.  I suspect his resting pulse will still be in the 50s.  If he does develop anginal symptomatology.  Reinitiation of 6.25 daily.  Will be implemented.  I will try to obtain the results of the blood work done by Dr. Reynaldo Minium.  I suspect his fatigue may be also related to his bradycardia.  He will continue his current regimen.  Amlodipine has improved his possible 3 kn affecting his distal fingers on cold days.  He continues to be on Vytorin and Fish oil for lipid therapy.  He does note some mild bruisability on aspirin/Plavix.  I will see him in 2 months for cardiology reevaluation.   Troy Sine, MD, [facility]  03/31/2015 11:01 AM

## 2015-04-02 DIAGNOSIS — R0602 Shortness of breath: Secondary | ICD-10-CM | POA: Diagnosis not present

## 2015-04-02 DIAGNOSIS — G4730 Sleep apnea, unspecified: Secondary | ICD-10-CM | POA: Diagnosis not present

## 2015-04-05 ENCOUNTER — Telehealth: Payer: Self-pay | Admitting: Cardiovascular Disease

## 2015-04-05 NOTE — Telephone Encounter (Signed)
This message was left on my voicemail last Wednesday. Hr said he was supposed to let Dr [name] know the name of the new medicine he is taking. His PCP prescribe his new medicine.

## 2015-04-05 NOTE — Telephone Encounter (Signed)
Called, no answer when dialed, no VM pickup.

## 2015-04-05 NOTE — Telephone Encounter (Signed)
Instructions for dose decrease and cessation of metoprolol discussed w/ patient, who verbalized understanding.

## 2015-04-14 ENCOUNTER — Other Ambulatory Visit: Payer: Self-pay | Admitting: Cardiovascular Disease

## 2015-04-14 MED ORDER — NITROGLYCERIN 0.4 MG SL SUBL: 0.4000 mg | SUBLINGUAL_TABLET | SUBLINGUAL | Status: DC | PRN

## 2015-04-14 NOTE — Telephone Encounter (Signed)
Rx(s) sent to pharmacy electronically.  

## 2015-04-14 NOTE — Telephone Encounter (Signed)
°*  STAT* If patient is at the pharmacy, call can be transferred to refill team.   1. Which medications need to be refilled? (please list name of each medication and dose if known) Nitrostat 0.4mg    2. Which pharmacy/location (including street and city if local pharmacy) is medication to be sent to?[facility]   3. Do they need a 30 day or 90 day supply? [place]

## 2015-05-21 DIAGNOSIS — R05 Cough: Secondary | ICD-10-CM | POA: Diagnosis not present

## 2015-05-21 DIAGNOSIS — J018 Other acute sinusitis: Secondary | ICD-10-CM | POA: Diagnosis not present

## 2015-05-21 DIAGNOSIS — J069 Acute upper respiratory infection, unspecified: Secondary | ICD-10-CM | POA: Diagnosis not present

## 2015-05-25 DIAGNOSIS — M7552 Bursitis of left shoulder: Secondary | ICD-10-CM | POA: Diagnosis not present

## 2015-06-04 ENCOUNTER — Ambulatory Visit (INDEPENDENT_AMBULATORY_CARE_PROVIDER_SITE_OTHER): Payer: Medicare Other | Admitting: Cardiovascular Disease

## 2015-06-04 VITALS — BP 132/70 | HR 53 | Ht 69.0 in | Wt 167.4 lb

## 2015-06-04 DIAGNOSIS — R002 Palpitations: Secondary | ICD-10-CM | POA: Diagnosis not present

## 2015-06-04 DIAGNOSIS — E785 Hyperlipidemia, unspecified: Secondary | ICD-10-CM

## 2015-06-04 DIAGNOSIS — I25810 Atherosclerosis of coronary artery bypass graft(s) without angina pectoris: Secondary | ICD-10-CM | POA: Diagnosis not present

## 2015-06-04 DIAGNOSIS — R011 Cardiac murmur, unspecified: Secondary | ICD-10-CM | POA: Diagnosis not present

## 2015-06-04 DIAGNOSIS — I7300 Raynaud's syndrome without gangrene: Secondary | ICD-10-CM

## 2015-06-04 DIAGNOSIS — I359 Nonrheumatic aortic valve disorder, unspecified: Secondary | ICD-10-CM

## 2015-06-04 DIAGNOSIS — G4733 Obstructive sleep apnea (adult) (pediatric): Secondary | ICD-10-CM

## 2015-06-04 DIAGNOSIS — I2510 Atherosclerotic heart disease of native coronary artery without angina pectoris: Secondary | ICD-10-CM

## 2015-06-04 NOTE — Patient Instructions (Signed)
Your physician has requested that you have an echocardiogram and office visit in 6 months or sooner if needed.  If you need a refill on your cardiac medications before your next appointment, please call your pharmacy.

## 2015-06-05 ENCOUNTER — Encounter: Payer: Self-pay | Admitting: Cardiovascular Disease

## 2015-06-05 NOTE — Progress Notes (Signed)
Patient ID: Mikhail Tonner, male   DOB: May 23, 1928, 82 y.o.   MRN: [id number]   Primary M.D.: Dr. Reynaldo Minium  HPI: Bilaal Desautel is a 82 y.o. male who presents for a 2 month follow-up cardiology evaluation.   Mr. Blickenstaff has a history of bacterial endocarditis initially in 1980 and repeat in the 1990s. He has known CAD and cardiac catheterization in 2007 showed total occlusion of the circumflex vessel as well as high-grade AV groove circumflex and RCA disease for which he underwent stenting of his RCA and recanalization of a totally occluded circumflex and marginal system. Additional problems include paroxysmal atrial fibrillation. An echo Doppler study in August 2012 showed mild LVH with normal systolic function and grade 1 diastolic dysfunction. He had mild aortic sclerosis with mild-to-moderate aortic insufficiency, mild MR, mild TR and PR. There is mild pulmonary hypertension with an estimated PA pressure 35 mm. His last stress test was in October 2013 prior to a trip to [place] when he had experienced some vague symptoms of chest pain. The nuclear perfusion study was unchanged and continued to show fairly normal perfusion. He also has a history of obstructive sleep apnea for which he sees Dr. Keturah Barre.  He has had issues of occasional palpitations, weakness and fatigue. His beta blocker dose was reduced from Lopressor 12.5 twice a day to 12.5 mg daily and last year had a CardioNet monitor.  CardioNet monitor demonstrated a 4 beat episode of PSVT. He also complained of "tingling "in the left upper chest region with inspiration.  He underwent a follow-up nuclear study which continued to reveal normal perfusion without scar or ischemia.   When I saw him last year he also complained that his fingers turning white particularly with cold weather on a routine basis. He has to wear gloves the entire winter season and at times there may be transient red and blue discoloration.  I was concerned about  the possibility of Raynaud's versus digital artery vasospasm and elected to start him on low-dose amlodipine at 2.5 mg which improved some of his symptomatology.  When I last saw him, he denied any episodes of chest pain.  He is unaware of palpitations.  He denies presyncope or syncope.  He continues to use CPAP with 100% compliance.  He had seen Dr. Reynaldo Minium who checked complete blood work from the patient was told that his labs were normal with the exception of his  PSA which was elevated.  He was  Also started on low-dose thyroid medicine by Dr. Reynaldo Minium for hfatigability , but I am not certain what his TSH level was pretreatment.   when I saw him, he was significantly bradycardic with a pulse at 48. He was on very low-dose metoprolol, tartrate at 12.5 mg daily. I suspected that he was developing progressive sinus node dysfunction.  I reduced his metoprolol to 6.25 mg for 2 days and then ultimately weaned and discontinued this altogether. He has noted slight improved energy.  He denies recurrent episodes of chest pain.  He denies dizziness.  He denies palpitations.  He presents for reevaluation.  Past Medical History  Diagnosis Date  . Hyperlipidemia   . Colon cancer ([place]) 1987  . Coronary artery disease   . History of bacterial endocarditis 1980, 1990s  . OSA on CPAP     Past Surgical History  Procedure Laterality Date  . Knee surgery  2004    right knee  . Transthoracic echocardiogram  12/2010    EF=>55%, mild conc LVH;  borderline LA enlargement; calcification of anterior MV leaflets, mild MR; mild TR; RVSP 30-31mmHg; mild calcification of AV leaflets and mild-mod AV regurg; mild pulm regurg; aortic root sclerosis/calcification  . Nm myocar perf wall motion  02/2012    lexiscan - normal perfusion, EF 66%, low risk   . Cardiac catheterization  08/07/2005    40-50% narrowing in LAD, 30-40% scattered irregularity of diagonal, subtotal-total prox Cfx occlusion w/collaterals of marginal 1, prox  30-40% RCA narrowing, 40-50% distal RCA stenosis (Dr. Corky Downs)  . Coronary angioplasty with stent placement  08/15/2005    stent to RCA and prox Cfx with 3x43mm DES to AV groove Cfx and 2.5x13 Cypher DES to marginal (Dr. Corky Downs)  . Subtotal colectomy      in [place] r/t adenocarcinoma of colon     No Known Allergies  Current Outpatient Prescriptions  Medication Sig Dispense Refill  . amLODipine (NORVASC) 5 MG tablet Take 1/2 tablet daily. 45 tablet 3  . aspirin 81 MG tablet Take 81 mg by mouth daily.    . Cholecalciferol (VITAMIN D3) 400 UNITS CAPS Take 1 capsule by mouth daily.    . clopidogrel (PLAVIX) 75 MG tablet TAKE ONE (1) TABLET BY MOUTH EVERY DAY 90 tablet 3  . co-enzyme Q-10 30 MG capsule Take 30 mg by mouth daily.    . fish oil-omega-3 fatty acids 1000 MG capsule Take 2 g by mouth daily.    . isosorbide mononitrate (IMDUR) 30 MG 24 hr tablet TAKE ONE (1) TABLET BY MOUTH EVERY DAY 90 tablet 3  . nitroGLYCERIN (NITROSTAT) 0.4 MG SL tablet Place 1 tablet (0.4 mg total) under the tongue every 5 (five) minutes as needed for chest pain. 25 tablet 3  . NON FORMULARY CPAP therapy    . saw palmetto 160 MG capsule Take 160 mg by mouth daily.    . tamsulosin (FLOMAX) 0.4 MG CAPS capsule Take 1 capsule (0.4 mg total) by mouth daily. 90 capsule 3  . VYTORIN 10-20 MG tablet TAKE ONE TABLET BY MOUTH EVERY NIGHT AT BEDTIME 90 tablet 1   No current facility-administered medications for this visit.    Social History   Social History  . Marital Status: Married    Spouse Name: N/A  . Number of Children: N/A  . Years of Education: N/A   Occupational History  . Not on file.   Social History Main Topics  . Smoking status: Never Smoker   . Smokeless tobacco: Never Used  . Alcohol Use: 0.0 oz/week    0 Standard drinks or equivalent per week     Comment: ocassional glass of wine.  . Drug Use: Not on file  . Sexual Activity: Not on file   Other Topics Concern  . Not on file    Social History Narrative   Additional social history is notable that he is originally from [place]. He has 3 children 2 grandchildren. He does exercise. There is no alcohol or tobacco use.  No family history on file.  ROS General: Negative; No fevers, chills, or night sweats;  HEENT: Negative; No changes in vision or hearing, sinus congestion, difficulty swallowing Pulmonary: Negative; No cough, wheezing, shortness of breath, hemoptysis Cardiovascular: Negative; No chest pain, presyncope, syncope, palpitations GI: Negative; No nausea, vomiting, diarrhea, or abdominal pain GU: Negative; No dysuria, hematuria, or difficulty voiding Musculoskeletal: Negative; no myalgias, joint pain, or weakness Hematologic/Oncology: Negative; no easy bruising, bleeding Endocrine: Negative; no heat/cold intolerance; no diabetes Neuro: Negative; no changes in  balance, headaches Skin: Negative; No rashes or skin lesions Psychiatric: Negative; No behavioral problems, depression Sleep: Positive for obstructive sleep apnea, followed by Dr. Annamaria Boots; No snoring, daytime sleepiness, hypersomnolence, bruxism, restless legs, hypnogognic hallucinations, no cataplexy Other comprehensive 14 point system review is negative.   PE BP 132/70 mmHg  Pulse 53  Ht 5\' 9"  (1.753 m)  Wt 167 lb 6.4 oz (75.932 kg)  BMI 24.71 kg/m2    Wt Readings from Last 3 Encounters:  06/04/15 167 lb 6.4 oz (75.932 kg)  03/31/15 172 lb (78.019 kg)  09/10/14 169 lb 1.6 oz (76.703 kg)   General: Alert, oriented, no distress.  Skin: normal turgor, no rashes HEENT: Normocephalic, atraumatic. Pupils round and reactive; sclera anicteric;no lid lag.  Nose without nasal septal hypertrophy Mouth/Parynx benign; Mallinpatti scale 3 Neck: No JVD, no carotid bruits with normal carotid upstroke Lungs: clear to ausculatation and percussion; no wheezing or rales Heart:  Regular but bradycardic in the 50s, s1 s2 normal 1/6 systolic murmur unchanged,  with a whiff of AR; no rubs thrills or heaves Abdomen: soft, nontender; no hepatosplenomehaly, BS+; abdominal aorta nontender and not dilated by palpation. Back: No CVA tenderness Pulses 2+ Extremities: no clubbing cyanosis or edema, Homan's sign negative  Neurologic: grossly nonfocal Psychologic: normal affect and mood.  ECG (independently read by me):  Sinus bradycardia 53 bpm.  No significant ST segment changes.  November 2016 ECG (independently read by me): Sinus bradycardia at 48 bpm.  April 2016 ECG (independently read by me): Sinus bradycardia at 51 bpm.  No ectopy.  Normal intervals.  October 2015 ECG (independently read by me): Sinus bradycardia 58 beats per minute.  Normal intervals.  No ST segment changes.  LABS:  He is followed by Dr. Reynaldo Minium who checks his laboratory at [place].  I do not have copies of his most recent labs.   BMP 07/22/2007 07/21/2007 07/20/2007  Glucose 120(H) 112(H) 90  BUN 8 7 5(L)  Creatinine 1.30 1.25 1.20  Sodium 138 137 141  Potassium 3.8 3.9 4.0  Chloride 106 106 108  CO2 25 25 24   Calcium 9.1 9.1 9.3    Hepatic Function Panel     Component Value Date/Time   PROT 6.4 07/21/2007 0503   ALBUMIN 3.2* 07/21/2007 0503   AST 67* 07/21/2007 0503   ALT 132* 07/21/2007 0503   ALKPHOS 74 07/21/2007 0503   BILITOT 2.0* 07/21/2007 0503     CBC    Component Value Date/Time   WBC 6.9 07/21/2007 0503   RBC 4.48 07/21/2007 0503   HGB 12.9* 07/21/2007 0503   HCT 38.1* 07/21/2007 0503   PLT 155 07/21/2007 0503   MCV 85.0 07/21/2007 0503   MCHC 33.9 07/21/2007 0503   RDW 14.1 07/21/2007 0503   LYMPHSABS 1.7 07/17/2007 1200   MONOABS 0.6 07/17/2007 1200   EOSABS 0.0 07/17/2007 1200   BASOSABS 0.0 07/17/2007 1200     BNP No results found for: PROBNP  Lipid Panel  No results found for: CHOL   RADIOLOGY: No results found.    ASSESSMENT AND PLAN: Mr. Dellaporta is an 82 year old gentleman who has a remote history of  endocarditis x2 initially in 23 and subsequently into early 1990s. He is 9 years status post intervention to his RCA and recanalization of the circumflex system which was noted to be occluded with collateralization from the LAD to the OM prior to intervention. He has a 30x28 mm Cypher stent in the AV groove and 2.5x13 mm Cypher  stent in the OM1 ostium. The RCA has a 3.0x23 mm Cypher stent in its midsegment.  Over the last several years, his resting pulse has gotten somewhat slower.  When I last saw him, I weaned and discontinued his very low-dose beta blocker therapy.  His resting pulse is now at 53, improved from the upper 40s.  He has not had recurrent anginal symptomatology nor has he experienced any episodes of palpitations.  He continues to take amlodipine 2.5 mg, which is helpful both for his CAD, as well as for possible Raynaud's phenomenon.  He has a systolic murmur in the aortic region.  His last echo Doppler study was in 2012.  I am scheduling him for a echo Doppler study to assess his aortic valve and possible development of mild aortic stenosis. I will see him in 6 months for cardiology reevaluation.  Time spent: 25 minutes Troy Sine, MD, [facility]  06/05/2015 12:46 PM

## 2015-06-07 ENCOUNTER — Telehealth: Payer: Self-pay | Admitting: Cardiovascular Disease

## 2015-06-07 NOTE — Telephone Encounter (Signed)
Per answering service on 1/9 @ 10:32am  Pt called in wanting to discuss his medications. Please f/u with him  Thanks

## 2015-06-07 NOTE — Telephone Encounter (Signed)
Phone rings w/ no answer or VM pickup. 

## 2015-06-08 NOTE — Telephone Encounter (Signed)
Attempted to call pt back and no answer or VM set up.

## 2015-06-21 DIAGNOSIS — R1310 Dysphagia, unspecified: Secondary | ICD-10-CM | POA: Diagnosis not present

## 2015-06-24 ENCOUNTER — Telehealth: Payer: Self-pay | Admitting: Cardiovascular Disease

## 2015-06-24 NOTE — Telephone Encounter (Signed)
Request for surgical clearance:  What type of surgery is being performed? Endoscopy  1. When is this surgery scheduled? 2.9.17  2. Are there any medications that need to be held prior to surgery and how long?Plavix.Marland Kitchen5-7 days   3. Name of physician performing surgery? Jeffery Medoff   4. What is your office phone and fax number? Fax [phone]/  5.

## 2015-06-25 NOTE — Telephone Encounter (Signed)
Faxed written fax to [facility] @ [phone]

## 2015-07-07 DIAGNOSIS — Z7901 Long term (current) use of anticoagulants: Secondary | ICD-10-CM | POA: Diagnosis not present

## 2015-07-07 DIAGNOSIS — Z5181 Encounter for therapeutic drug level monitoring: Secondary | ICD-10-CM | POA: Diagnosis not present

## 2015-07-09 DIAGNOSIS — R05 Cough: Secondary | ICD-10-CM | POA: Diagnosis not present

## 2015-07-09 DIAGNOSIS — J018 Other acute sinusitis: Secondary | ICD-10-CM | POA: Diagnosis not present

## 2015-07-19 DIAGNOSIS — Z7901 Long term (current) use of anticoagulants: Secondary | ICD-10-CM | POA: Diagnosis not present

## 2015-07-19 DIAGNOSIS — Z5181 Encounter for therapeutic drug level monitoring: Secondary | ICD-10-CM | POA: Diagnosis not present

## 2015-07-20 DIAGNOSIS — K449 Diaphragmatic hernia without obstruction or gangrene: Secondary | ICD-10-CM | POA: Diagnosis not present

## 2015-07-20 DIAGNOSIS — R1310 Dysphagia, unspecified: Secondary | ICD-10-CM | POA: Diagnosis not present

## 2015-07-20 DIAGNOSIS — K222 Esophageal obstruction: Secondary | ICD-10-CM | POA: Diagnosis not present

## 2015-07-21 DIAGNOSIS — R05 Cough: Secondary | ICD-10-CM | POA: Diagnosis not present

## 2015-07-21 DIAGNOSIS — Z6825 Body mass index (BMI) 25.0-25.9, adult: Secondary | ICD-10-CM | POA: Diagnosis not present

## 2015-07-21 DIAGNOSIS — L570 Actinic keratosis: Secondary | ICD-10-CM | POA: Diagnosis not present

## 2015-07-21 DIAGNOSIS — J209 Acute bronchitis, unspecified: Secondary | ICD-10-CM | POA: Diagnosis not present

## 2015-07-21 DIAGNOSIS — L820 Inflamed seborrheic keratosis: Secondary | ICD-10-CM | POA: Diagnosis not present

## 2015-07-21 DIAGNOSIS — D485 Neoplasm of uncertain behavior of skin: Secondary | ICD-10-CM | POA: Diagnosis not present

## 2015-09-02 DIAGNOSIS — R972 Elevated prostate specific antigen [PSA]: Secondary | ICD-10-CM | POA: Diagnosis not present

## 2015-09-02 DIAGNOSIS — C188 Malignant neoplasm of overlapping sites of colon: Secondary | ICD-10-CM | POA: Diagnosis not present

## 2015-09-02 DIAGNOSIS — Z6827 Body mass index (BMI) 27.0-27.9, adult: Secondary | ICD-10-CM | POA: Diagnosis not present

## 2015-09-15 ENCOUNTER — Ambulatory Visit (INDEPENDENT_AMBULATORY_CARE_PROVIDER_SITE_OTHER): Payer: Medicare Other | Admitting: Neurology

## 2015-09-15 ENCOUNTER — Encounter: Payer: Self-pay | Admitting: Neurology

## 2015-09-15 VITALS — BP 140/82 | HR 76 | Resp 20 | Ht 69.0 in | Wt 171.0 lb

## 2015-09-15 DIAGNOSIS — I25810 Atherosclerosis of coronary artery bypass graft(s) without angina pectoris: Secondary | ICD-10-CM | POA: Diagnosis not present

## 2015-09-15 DIAGNOSIS — G4710 Hypersomnia, unspecified: Secondary | ICD-10-CM

## 2015-09-15 DIAGNOSIS — G4733 Obstructive sleep apnea (adult) (pediatric): Secondary | ICD-10-CM

## 2015-09-15 DIAGNOSIS — G4730 Sleep apnea, unspecified: Secondary | ICD-10-CM | POA: Diagnosis not present

## 2015-09-15 DIAGNOSIS — Z9989 Dependence on other enabling machines and devices: Principal | ICD-10-CM

## 2015-09-15 DIAGNOSIS — G478 Other sleep disorders: Secondary | ICD-10-CM

## 2015-09-15 DIAGNOSIS — G4752 REM sleep behavior disorder: Secondary | ICD-10-CM

## 2015-09-15 NOTE — Addendum Note (Signed)
Addended by: Larey Seat on: 09/15/2015 02:04 PM   Modules accepted: Orders

## 2015-09-15 NOTE — Progress Notes (Addendum)
SLEEP MEDICINE CLINIC   Provider:  Larey Seat, M D  Referring Provider: Burnard Bunting, MD Primary Care Physician:  Geoffery Lyons, MD  Chief Complaint  Patient presents with  . New Patient (Initial Visit)    using cpap, sleep study over 20 years ago, uses [facility], rm 75, with wife    HPI:  Anthony Petty is a 82 y.o. male , seen here as a referral  from Dr. Reynaldo Minium for transfer of apnea care.   [name] apparently has been a CPAP user for 2 decades and has been compliance with CPAP therapy. He told me today that he has taken his sleep apnea machine on multiple long distance trips and that he has always felt that it was beneficial for him to adhere to the therapy. He has moved to [facility] and is currently looking to transfer his sleep care to a local sleep specialist. But he finds that his sleep is restorative or refreshing his wife has noted that he sometimes still having apneas in spite of using the CPAP machine at night. He is currently using and nasal pillow and he brought the CPAP machine here today to this visit. His past medical history was described by Dr. 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  He has a cardiac stent, cholecystectomy and colon surgery behind him.    Sleep habits are as follows: The patient usually goes to bed around 11 PM, and usually is asleep promptly, his bedroom is conducive to sleep, cool, quiet and dark. He is using a CPAP pillow, he is mostly sleeping supine sometimes in the right lateral position. He wakes up several times at night but doesn't stay  awake very long, it is not usually a bathroom call that wakes him. His wife reports that he sometimes has vivid  dreams for example seeing a mouse in the bed.He is often moving - sometimes with jerky movements. The couple usually rises at around 7- 7:30 AM without an alarm. Usually [name] feels refreshed and restored in the morning, he does not wake up with significant headaches, dizziness, diaphoresis. He likes a nap about 4 times a week, usually less than one hour following lunch..   Sleep medical history and family sleep history: acting out dreams, no family history of OSA.   Social history:  Remarried for 29 years, one of 8 children, parents from [place]. Seldom driks ETOH, no tobacco use, caffeine : seldom.   Review of Systems: Out of a complete 14 system review, the patient complains of only the following symptoms, and all other reviewed systems are negative. No snoring is noted while the patient is using CPAP, but some breakthrough apneas may still be present. Sometimes acting out a dream or having a vivid dream hallucination, Epworth sleepiness score endorsed at 13 points, fatigue severity score endorsed at 12 points. geriatric depression score 1 point     Social History   Social History  . Marital Status: Married    Spouse Name: N/A  . Number of Children: N/A  . Years of Education: N/A   Occupational History  . Not on file.   Social History Main Topics  . Smoking status: Never Smoker   . Smokeless tobacco: Never Used  . Alcohol Use: 0.0  oz/week    0 Standard drinks or equivalent per week     Comment: ocassional glass of wine.  . Drug Use: Not on file  . Sexual Activity: Not on file   Other Topics Concern  . Not on file   Social History Narrative    No family history on file.  Past Medical History  Diagnosis Date  . Hyperlipidemia   . Colon cancer ([place]) 1987  . Coronary artery disease   . History of bacterial endocarditis 1980, 1990s  . OSA on CPAP     Past Surgical History  Procedure Laterality Date  . Knee surgery  2004    right knee  . Transthoracic  echocardiogram  12/2010    EF=>55%, mild conc LVH; borderline LA enlargement; calcification of anterior MV leaflets, mild MR; mild TR; RVSP 30-74mmHg; mild calcification of AV leaflets and mild-mod AV regurg; mild pulm regurg; aortic root sclerosis/calcification  . Nm myocar perf wall motion  02/2012    lexiscan - normal perfusion, EF 66%, low risk   . Cardiac catheterization  08/07/2005    40-50% narrowing in LAD, 30-40% scattered irregularity of diagonal, subtotal-total prox Cfx occlusion w/collaterals of marginal 1, prox 30-40% RCA narrowing, 40-50% distal RCA stenosis (Dr. Corky Downs)  . Coronary angioplasty with stent placement  08/15/2005    stent to RCA and prox Cfx with 3x59mm DES to AV groove Cfx and 2.5x13 Cypher DES to marginal (Dr. Corky Downs)  . Subtotal colectomy      in [place] r/t adenocarcinoma of colon     Current Outpatient Prescriptions  Medication Sig Dispense Refill  . amLODipine (NORVASC) 5 MG tablet Take 1/2 tablet daily. 45 tablet 3  . aspirin 81 MG tablet Take 81 mg by mouth daily.    . Cholecalciferol (VITAMIN D3) 400 UNITS CAPS Take 1 capsule by mouth daily.    . clopidogrel (PLAVIX) 75 MG tablet TAKE ONE (1) TABLET BY MOUTH EVERY DAY 90 tablet 3  . co-enzyme Q-10 30 MG capsule Take 30 mg by mouth daily.    . fish oil-omega-3 fatty acids 1000 MG capsule Take 2 g by mouth daily.    . isosorbide mononitrate (IMDUR) 30 MG 24 hr tablet TAKE ONE (1) TABLET BY MOUTH EVERY DAY 90 tablet 3  . nitroGLYCERIN (NITROSTAT) 0.4 MG SL tablet Place 1 tablet (0.4 mg total) under the tongue every 5 (five) minutes as needed for chest pain. 25 tablet 3  . NON FORMULARY CPAP therapy    . saw palmetto 160 MG capsule Take 160 mg by mouth daily.    . tamsulosin (FLOMAX) 0.4 MG CAPS capsule Take 1 capsule (0.4 mg total) by mouth daily. 90 capsule 3  . VYTORIN 10-20 MG tablet TAKE ONE TABLET BY MOUTH EVERY NIGHT AT BEDTIME 90 tablet 1   No current facility-administered medications for this  visit.    Allergies as of 09/15/2015  . (No Known Allergies)    Vitals: BP 140/82 mmHg  Pulse 76  Resp 20  Ht 5\' 9"  (1.753 m)  Wt 171 lb (77.565 kg)  BMI 25.24 kg/m2 Last Weight:  Wt Readings from Last 1 Encounters:  09/15/15 171 lb (77.565 kg)   [id number] mass index is 25.24 kg/(m^2).     Last Height:   Ht Readings from Last 1 Encounters:  09/15/15 5\' 9"  (1.753 m)    Physical exam:  General: The patient is awake, alert and appears not in acute distress. The patient is well groomed. Head:  Normocephalic, atraumatic. Neck is supple. Mallampati 4,  neck circumference :17 . Nasal airflow restricted , TMJ is not evident . Retrognathia is not seen.  Cardiovascular:  Regular rate and rhythm, without  murmurs or carotid bruit, and without distended neck veins. Respiratory: Lungs are clear to auscultation. Skin:  Without evidence of edema, or rash Trunk:  Normal    Neurologic exam : The patient is awake and alert, oriented to place and time.   Memory subjective  described as intact.   Attention span & concentration ability appears normal.  Speech is fluent,  Without dysarthria, dysphonia or aphasia.  Mood and affect are appropriate.  Cranial nerves: Pupils are equal and briskly reactive to light. Funduscopic exam without  evidence of pallor or edema.  Extraocular movements  in vertical and horizontal planes intact and without nystagmus. Visual fields by finger perimetry are intact. Hearing to finger rub intact. Facial sensation intact to fine touch. Facial motor strength is symmetric and tongue and uvula move midline. Shoulder shrug was symmetrical.   Motor exam: Normal tone, muscle bulk and symmetric strength in all extremities.  Sensory:  Fine touch, pinprick and vibration were tested in all extremities. Proprioception tested in the upper extremities was normal.  Coordination: Rapid alternating movements in the fingers/hands was normal. Finger-to-nose maneuver  normal without  evidence of ataxia, dysmetria or tremor.  Gait and station: Patient walks without assistive device and is able unassisted to climb up to the exam table. Strength within normal limits.  Stance is stable and normal.   Deep tendon reflexes: in the  upper and lower extremities are symmetric and intact. Babinski maneuver response is  downgoing.  The patient was advised of the nature of the diagnosed sleep disorder , the treatment options and risks for general a health and wellness arising from not treating the condition.  I spent more than 35 minutes of face to face time with the patient. Greater than 50% of time was spent in counseling and coordination of care. We have discussed the diagnosis and differential and I answered the patient's questions.     Assessment:  After physical and neurologic examination, review of laboratory studies,  Personal review of imaging studies, reports of other /same  Imaging studies ,  Results of polysomnography/ neurophysiology testing and pre-existing records as far as provided in visit, my assessment is :  1) Mr. Coman has a long-standing history of obstructive sleep apnea also I'm not aware of how severe his apnea may have been at the time of diagnosis. At this time he is fairly happy with his machine which works well for him, and with his nasal pillow. I am unable to obtain a download from his machine telling me if he still has residual apneas or not. His machine was replaced by [facility],  In [place].    2) the patient uses currently and model 1 that is issued by by apnea and built by [employer]. The machine itself is not an new model and probably about 34 if not 80 years old. It was refurbished. If I'm unable to get data downloads through his durable medical equipment company I would issue a loner machine for a couple of weeks just to see how he truly sleeps at home and how much apnea may be there. Alternatively we could just ask him to have a night in the sleep lab  but this would make creative difficulties for the couple.  3) The patient decided for a loaner machine , this  way I will get data from his CPAP. I will ask for a loaner for 14 days.     Plan:  Treatment plan and additional workup : no repeat study unless we cannot download his machine.   Addendum; My nurse was able to get a download from November 2016 in regards to the patient's CPAP machine. He has a residual AHI of 17. He is highly compliant and he uses his machine over 10 hours at night. Based on these data I will ask him to return for a re-titration.   Asencion Partridge Donnalee Cellucci MD  09/15/2015   CC: Burnard Bunting, [address] [place], [place] [id number]

## 2015-09-16 ENCOUNTER — Other Ambulatory Visit: Payer: Self-pay | Admitting: Cardiovascular Disease

## 2015-09-16 NOTE — Telephone Encounter (Signed)
Rx refill sent to pharmacy. 

## 2015-09-21 ENCOUNTER — Encounter: Payer: Self-pay | Admitting: *Deleted

## 2015-10-05 ENCOUNTER — Other Ambulatory Visit: Payer: Self-pay | Admitting: Cardiovascular Disease

## 2015-10-05 NOTE — Telephone Encounter (Signed)
Rx refill sent to pharmacy. 

## 2015-10-06 ENCOUNTER — Ambulatory Visit (INDEPENDENT_AMBULATORY_CARE_PROVIDER_SITE_OTHER): Payer: Medicare Other | Admitting: Neurology

## 2015-10-06 DIAGNOSIS — G4733 Obstructive sleep apnea (adult) (pediatric): Secondary | ICD-10-CM | POA: Diagnosis not present

## 2015-10-06 DIAGNOSIS — G478 Other sleep disorders: Secondary | ICD-10-CM

## 2015-10-06 DIAGNOSIS — Z9989 Dependence on other enabling machines and devices: Principal | ICD-10-CM

## 2015-10-14 ENCOUNTER — Telehealth: Payer: Self-pay

## 2015-10-14 DIAGNOSIS — G4733 Obstructive sleep apnea (adult) (pediatric): Secondary | ICD-10-CM

## 2015-10-14 NOTE — Telephone Encounter (Signed)
I called pt to discuss sleep study results. Home phone just keeps ringing, no VM picks up. I left a message on cell phone asking pt to call me back.

## 2015-10-14 NOTE — Telephone Encounter (Signed)
Patient is returning your call.  

## 2015-10-14 NOTE — Telephone Encounter (Signed)
Spoke to pt. I advised him that his sleep study results revealed osa and Dr. Brett Fairy recommends starting a new cpap. Pt is agreeable to starting a new cpap. Pt says he has been using [facility] and would like the order for cpap sent to [facility]. A follow up appt was made for 7/26 at 9:30. Pt verbalized understanding. I advised pt to avoid caffeine containing beverages and chocolate. Pt verbalized understanding. Pt had no questions at this time but was encouraged to call back if questions arise.

## 2015-10-19 DIAGNOSIS — M1710 Unilateral primary osteoarthritis, unspecified knee: Secondary | ICD-10-CM | POA: Diagnosis not present

## 2015-10-28 ENCOUNTER — Telehealth: Payer: Self-pay | Admitting: Neurology

## 2015-10-28 NOTE — Telephone Encounter (Signed)
Pt called said he rec'd letter telling him to call [facility] if he has not heard anything by 5/25. Sts he did call them and was told they haven't rec'd a RX. Operator relayed that a msg would be sent to RN and she would call [facility] and call him.

## 2015-10-28 NOTE — Telephone Encounter (Signed)
Patient called and hasn't heard anything about his CPAP.  He has called [facility] but they wouldn't help him at all.  Patient is angry.  Please give him a called.  [phone]

## 2015-10-28 NOTE — Telephone Encounter (Signed)
I called pt. He hasn't heard anything from [facility] regarding his cpap. I advised him that I would reach out to [facility] and see what the problem was, and would call him back on Monday. Pt verbalized understanding.

## 2015-10-28 NOTE — Telephone Encounter (Signed)
I called pt. He hasn't heard anything from [facility] regarding his cpap. I advised him that I would reach out to [facility] and see what the problem was, and would call him back on Monday. Pt verbalized understanding.

## 2015-11-01 NOTE — Telephone Encounter (Signed)
I spoke to pt. He says that [facility] did call him but won't give him a new cpap because his current one is not "old enough". I will reach out to [facility] to make sure they are able to change the pressures to auto set as ordered. Pt verbalized understanding.

## 2015-11-12 ENCOUNTER — Other Ambulatory Visit: Payer: Self-pay | Admitting: Cardiovascular Disease

## 2015-11-30 ENCOUNTER — Other Ambulatory Visit: Payer: Self-pay | Admitting: Cardiovascular Disease

## 2015-12-01 NOTE — Telephone Encounter (Signed)
Rx Refill

## 2015-12-15 NOTE — Telephone Encounter (Signed)
I spoke to pt. He is insistent that [facility] changed the pressure on his cpap and that he will keep the 8/21 appt. Per [facility], they have not changed the settings. Pt says that he will keep the 8/21 appt and knows to bring his cpap.

## 2015-12-15 NOTE — Telephone Encounter (Signed)
I spoke with Roberto Scales (wife, per [facility]). She advises me that [facility] did change the settings. However, she taking a very important call from [employer] and will call me back to discuss further.

## 2015-12-15 NOTE — Telephone Encounter (Signed)
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 The appt has been r/s to 8 21. Pt is aware RN would call if this appt is out of the range for ins to pay.

## 2015-12-15 NOTE — Telephone Encounter (Signed)
I spoke to [facility]. Pt no-showed for his first appt and then did not bring his cpap machine to his second appt so the auto settings have not been changed.  Pt needs to call [facility] and bring his cpap to that appt to get his settings changed. Then, he needs a 30-60 day follow up. I think the 8/21 appt is too soon.  I called pt to discuss. No answer, left a message on both home and cell that he call me back.

## 2015-12-15 NOTE — Telephone Encounter (Signed)
Pt called sts he did go to [facility] on 6/13 @ 2:30 and the auto settings were changed from 9 to 14. Please call to discuss

## 2015-12-16 DIAGNOSIS — J301 Allergic rhinitis due to pollen: Secondary | ICD-10-CM | POA: Diagnosis not present

## 2015-12-17 ENCOUNTER — Ambulatory Visit (HOSPITAL_COMMUNITY): Payer: BLUE CROSS/BLUE SHIELD

## 2015-12-21 DIAGNOSIS — N138 Other obstructive and reflux uropathy: Secondary | ICD-10-CM | POA: Diagnosis present

## 2015-12-21 DIAGNOSIS — I10 Essential (primary) hypertension: Secondary | ICD-10-CM | POA: Insufficient documentation

## 2015-12-21 DIAGNOSIS — N401 Enlarged prostate with lower urinary tract symptoms: Secondary | ICD-10-CM | POA: Diagnosis present

## 2015-12-22 ENCOUNTER — Ambulatory Visit: Payer: Self-pay | Admitting: Neurology

## 2015-12-23 DIAGNOSIS — L03032 Cellulitis of left toe: Secondary | ICD-10-CM | POA: Diagnosis not present

## 2015-12-23 DIAGNOSIS — B351 Tinea unguium: Secondary | ICD-10-CM | POA: Diagnosis not present

## 2015-12-27 DIAGNOSIS — H5203 Hypermetropia, bilateral: Secondary | ICD-10-CM | POA: Diagnosis not present

## 2015-12-27 DIAGNOSIS — H52203 Unspecified astigmatism, bilateral: Secondary | ICD-10-CM | POA: Diagnosis not present

## 2015-12-27 DIAGNOSIS — Z961 Presence of intraocular lens: Secondary | ICD-10-CM | POA: Diagnosis not present

## 2015-12-29 DIAGNOSIS — Z933 Colostomy status: Secondary | ICD-10-CM | POA: Insufficient documentation

## 2016-01-07 ENCOUNTER — Other Ambulatory Visit: Payer: Self-pay | Admitting: Cardiovascular Disease

## 2016-01-07 NOTE — Telephone Encounter (Signed)
REFILL 

## 2016-01-13 ENCOUNTER — Encounter (HOSPITAL_COMMUNITY): Payer: Self-pay | Admitting: *Deleted

## 2016-01-13 NOTE — Progress Notes (Unsigned)
Spoke with Patient, notified of time, date and location for echo.  Deliah Boston, RDCS

## 2016-01-17 ENCOUNTER — Encounter (INDEPENDENT_AMBULATORY_CARE_PROVIDER_SITE_OTHER): Payer: Self-pay

## 2016-01-17 ENCOUNTER — Other Ambulatory Visit: Payer: Self-pay

## 2016-01-17 ENCOUNTER — Encounter: Payer: Self-pay | Admitting: Neurology

## 2016-01-17 ENCOUNTER — Ambulatory Visit (INDEPENDENT_AMBULATORY_CARE_PROVIDER_SITE_OTHER): Payer: Medicare Other | Admitting: Neurology

## 2016-01-17 ENCOUNTER — Ambulatory Visit (HOSPITAL_COMMUNITY): Payer: Medicare Other | Attending: Cardiovascular Disease

## 2016-01-17 VITALS — BP 118/62 | HR 48 | Resp 20 | Ht 68.0 in | Wt 171.0 lb

## 2016-01-17 DIAGNOSIS — I25810 Atherosclerosis of coronary artery bypass graft(s) without angina pectoris: Secondary | ICD-10-CM

## 2016-01-17 DIAGNOSIS — G4731 Primary central sleep apnea: Secondary | ICD-10-CM | POA: Diagnosis not present

## 2016-01-17 DIAGNOSIS — I340 Nonrheumatic mitral (valve) insufficiency: Secondary | ICD-10-CM | POA: Insufficient documentation

## 2016-01-17 DIAGNOSIS — I2510 Atherosclerotic heart disease of native coronary artery without angina pectoris: Secondary | ICD-10-CM | POA: Insufficient documentation

## 2016-01-17 DIAGNOSIS — R001 Bradycardia, unspecified: Secondary | ICD-10-CM | POA: Insufficient documentation

## 2016-01-17 DIAGNOSIS — G4733 Obstructive sleep apnea (adult) (pediatric): Secondary | ICD-10-CM | POA: Insufficient documentation

## 2016-01-17 DIAGNOSIS — I351 Nonrheumatic aortic (valve) insufficiency: Secondary | ICD-10-CM | POA: Diagnosis not present

## 2016-01-17 DIAGNOSIS — E785 Hyperlipidemia, unspecified: Secondary | ICD-10-CM | POA: Insufficient documentation

## 2016-01-17 DIAGNOSIS — R011 Cardiac murmur, unspecified: Secondary | ICD-10-CM | POA: Diagnosis not present

## 2016-01-17 LAB — ECHOCARDIOGRAM COMPLETE
Height: 68 in
Weight: 2736 oz

## 2016-01-17 NOTE — Progress Notes (Signed)
SLEEP MEDICINE CLINIC   Provider:  Larey Seat, M D  Referring Provider: Burnard Bunting, MD Primary Care Physician:  No primary care provider on file.  Chief Complaint  Patient presents with  . Follow-up    cpap going "fine, uses [facility]    HPI:  Anthony Petty is a 82 y.o. male , seen here as a referral  from Dr. Reynaldo Minium for transfer of apnea care.   Anthony Petty apparently has been a CPAP user for 2 decades and has been compliance with CPAP therapy. He told me today that he has taken his sleep apnea machine on multiple long distance trips and that he has always felt that it was beneficial for him to adhere to the therapy. He has moved to [facility] and is currently looking to transfer his sleep care to a local sleep specialist. But he finds that his sleep is restorative or refreshing his wife has noted that he sometimes still having apneas in spite of using the CPAP machine at night. He is currently using and nasal pillow and he brought the CPAP machine here today to this visit. His past medical history was described by Dr. 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  He has a cardiac stent, cholecystectomy and colon surgery behind him.  Interval history from 01/17/2016. I have the pleasure of seeing our mutual patient Anthony Petty  Today, with undergone a new titration study on 10/06/2015. His baseline AHI was 27, in supine sleep his apnea increased to 37.8 point per hour of sleep and no REM sleep was noted. He was titrated to 15 cm water but did best at a pressure of 12. In supine his AHI became 0.0 in nonsupine 5.6 and overall averaged at 3.9.  I had the pleasure today to see his compliance and he  has been 100% compliance for the last 30 days was over 100% of use of 4 hours or longer each night. Average user time is 8 hours and 31 minutes nightly the mean CPAP pressure is 10.4 cm but he still has some apneas left. The minimum pressure is 10 and the maximum pressure 15 cm with 3 cm a flex. The machine measured not longer the presence of obstructive sleep apnea but breakthrough of central apnea. The average AHI was 23.8 based on over 19.7 central apneas. Based on this I will have to reduce the CPAP pressure slightly his wife reports that he sometimes has to punch him when he makes "" noises " but overall the patient himself feels that this has helped him to get sound asleep and he feels more rested and refreshed.    Sleep habits are as follows:The patient usually goes to bed around 11 PM, and usually is asleep promptly, his bedroom is conducive to sleep, cool, quiet and dark. He is using a CPAP pillow, he is mostly sleeping supine sometimes in the right lateral position. He wakes up several times at night but doesn't stay  awake very long, it is not usually a bathroom call that wakes him. His wife reports that he sometimes has vivid dreams for example seeing a mouse in the bed.He is often moving - sometimes with jerky movements. The couple usually rises at around 7- 7:30 AM without an  alarm. Usually Anthony Petty feels refreshed and restored in the morning, he does not wake up with significant headaches, dizziness, diaphoresis. He likes a nap about 4 times a week, usually less than one hour following lunch..   Sleep medical history and family sleep history: acting out dreams, no family history of OSA.   Social history:  Remarried for 29 years, one of 8 children, parents from [place]. Seldom driks ETOH, no tobacco use, caffeine : seldom.   Review of Systems: Out of a complete 14 system review, the patient complains of only the following symptoms, and all other reviewed systems are negative. No  snoring is noted while the patient is using CPAP, but some breakthrough apneas may still be present. Sometimes acting out a dream or having a vivid dream hallucination,  Epworth sleepiness score endorsed at  6 from 13 points before CPAP , fatigue severity score endorsed at 16 from 12 points. geriatric depression score 1 point  Easy bruising.  Rare now REM BD- reduced under treatment of apnea.    Social History   Social History  . Marital status: Married    Spouse name: N/A  . Number of children: N/A  . Years of education: N/A   Occupational History  . Not on file.   Social History Main Topics  . Smoking status: Never Smoker  . Smokeless tobacco: Never Used  . Alcohol use 0.0 oz/week     Comment: ocassional glass of wine.  . Drug use: Unknown  . Sexual activity: Not on file   Other Topics Concern  . Not on file   Social History Narrative  . No narrative on file    No family history on file.  Past Medical History:  Diagnosis Date  . Colon cancer ([place]) 1987  . Coronary artery disease   . History of bacterial endocarditis 1980, 1990s  . Hyperlipidemia   . OSA on CPAP     Past Surgical History:  Procedure Laterality Date  . CARDIAC CATHETERIZATION  08/07/2005   40-50% narrowing in LAD, 30-40% scattered irregularity of diagonal, subtotal-total prox Cfx occlusion w/collaterals of marginal 1, prox 30-40% RCA narrowing, 40-50% distal RCA stenosis (Dr. Corky Downs)  . CORONARY ANGIOPLASTY WITH STENT PLACEMENT  08/15/2005   stent to RCA and prox Cfx with 3x72mm DES to AV groove Cfx and 2.5x13 Cypher DES to marginal (Dr. Corky Downs)  . KNEE SURGERY  2004   right knee  . NM MYOCAR PERF WALL MOTION  02/2012   lexiscan - normal perfusion, EF 66%, low risk   . SUBTOTAL COLECTOMY     in [place] r/t adenocarcinoma of colon   . TRANSTHORACIC ECHOCARDIOGRAM  12/2010   EF=>55%, mild conc LVH; borderline LA enlargement; calcification of anterior MV leaflets, mild MR; mild TR; RVSP  30-75mmHg; mild calcification of AV leaflets and mild-mod AV regurg; mild pulm regurg; aortic root sclerosis/calcification    Current Outpatient Prescriptions  Medication Sig Dispense Refill  . amLODipine (NORVASC) 5 MG tablet Take 0.5 tablets (2.5 mg total) by mouth daily. KEEP OV. 45 tablet 0  . aspirin 81 MG tablet Take 81 mg by mouth daily.    . Cholecalciferol (VITAMIN D3) 400 UNITS CAPS Take 1 capsule by mouth daily.    . clopidogrel (PLAVIX) 75 MG tablet TAKE ONE (1) TABLET EACH DAY 90 tablet 1  . co-enzyme Q-10 30 MG capsule Take 30 mg by mouth daily.    . fish oil-omega-3 fatty acids 1000 MG capsule Take 2 g  by mouth daily.    . isosorbide mononitrate (IMDUR) 30 MG 24 hr tablet TAKE ONE (1) TABLET EACH DAY 90 tablet 0  . nitroGLYCERIN (NITROSTAT) 0.4 MG SL tablet Place 1 tablet (0.4 mg total) under the tongue every 5 (five) minutes as needed for chest pain. 25 tablet 3  . NON FORMULARY CPAP therapy    . saw palmetto 160 MG capsule Take 160 mg by mouth daily.    . tamsulosin (FLOMAX) 0.4 MG CAPS capsule Take 1 capsule (0.4 mg total) by mouth daily. 90 capsule 3  . VYTORIN 10-20 MG tablet TAKE ONE (1) TABLET EACH DAY AT BEDTIME 90 tablet 0   No current facility-administered medications for this visit.     Allergies as of 01/17/2016  . (No Known Allergies)    Vitals: BP 118/62   Pulse (!) 48   Resp 20   Ht 5\' 8"  (1.727 m)   Wt 171 lb (77.6 kg)   BMI 26.00 kg/m  Last Weight:  Wt Readings from Last 1 Encounters:  01/17/16 171 lb (77.6 kg)   [id number] mass index is 26 kg/m.     Last Height:   Ht Readings from Last 1 Encounters:  01/17/16 5\' 8"  (1.727 m)    Physical exam:  General: The patient is awake, alert and appears not in acute distress. The patient is well groomed. Head: Normocephalic, atraumatic. Neck is supple. Mallampati 4,  neck circumference :17 . Nasal airflow restricted , TMJ is not evident . Retrognathia is not seen.  Cardiovascular:  Regular rate and  rhythm, without  murmurs or carotid bruit, and without distended neck veins. Respiratory: Lungs are clear to auscultation. Skin:  Without evidence of edema, or rash Trunk:  Normal    Neurologic exam : The patient is awake and alert, oriented to place and time.   Memory subjective  described as intact.   Attention span & concentration ability appears normal.  Speech is fluent,  Without dysarthria, dysphonia or aphasia.  Mood and affect are appropriate.  Cranial nerves: Pupils are equal and briskly reactive to light. Funduscopic exam without  evidence of pallor or edema.  Extraocular movements  in vertical and horizontal planes intact and without nystagmus. Visual fields by finger perimetry are intact. Hearing to finger rub intact. Facial sensation intact to fine touch. Facial motor strength is symmetric and tongue and uvula move midline. Shoulder shrug was symmetrical.   Motor exam: Normal tone, muscle bulk and symmetric strength in all extremities. No cog-wheeling   Sensory:  Fine touch, pinprick and vibration /  Proprioception t was normal. Deep tendon reflexes: in the  upper and lower extremities are symmetric and intact. Babinski maneuver response is  downgoing.  The patient was advised of the nature of the diagnosed sleep disorder , the treatment options and risks for general a health and wellness arising from not treating the condition.  I spent more than 25 minutes of face to face time with the patient. Greater than 50% of time was spent in counseling and coordination of care. We have discussed the diagnosis and differential and I answered the patient's questions.     Assessment:  After physical and neurologic examination, review of laboratory studies,  Personal review of imaging studies, reports of other /same  Imaging studies ,  Results of polysomnography/ neurophysiology testing and pre-existing records as far as provided in visit, my assessment is :  1) Anthony Petty has a  long-standing history of obstructive sleep apnea - now  he presents with more Cheyne stokes breathing on a newly issued and titrated CPAP. Between 5-15 cm water.   Since there are no central apneas and obstructive apneas measured I will change his new machine to a BiPAP setting with a 4 cm spread 14/10 cm water. I will ask [facility] is respiratory technician to change the settings on his machine.  Asencion Partridge Damond Borchers MD  01/17/2016   CC: Burnard Bunting, [address] [place], [place] [id number]

## 2016-01-17 NOTE — Patient Instructions (Signed)

## 2016-01-19 ENCOUNTER — Telehealth: Payer: Self-pay

## 2016-01-19 DIAGNOSIS — Z9989 Dependence on other enabling machines and devices: Principal | ICD-10-CM

## 2016-01-19 DIAGNOSIS — G4733 Obstructive sleep apnea (adult) (pediatric): Secondary | ICD-10-CM

## 2016-01-19 NOTE — Telephone Encounter (Signed)
Please see documentation from [facility]. Pt's insurance will not cover bipap.  Do you want a replacement cpap instead?

## 2016-01-19 NOTE — Telephone Encounter (Signed)
-----   Message from Patsy Baltimore sent at 01/19/2016 10:50 AM EDT ----- Vickie Epley,  We are a little confused by this referral/order. The order is for Bipap trial 10-14. This pressure range is for a cpap not a bipap. Our specialist reviewed the order, sleep study and office visit note. Since the sleep study does not show the pt failing the cpap then insurance wouldn't cover a bipap.  Is this supposed to be an order for a replacement cpap?  Please advise. Thanks.  Angie ----- Message ----- From: Patsy [place] Sent: 01/17/2016  11:33 AM To: Rondel Baton Kourtni Stineman, RN  Good morning Cyril Mourning, This order has been sent. Thanks.  Angie ----- Message ----- From: Lester [place], RN Sent: 01/17/2016  10:40 AM To: Patsy Baltimore  New order in!

## 2016-01-19 NOTE — Telephone Encounter (Signed)
I have told her up his last sleep study report and office notes and will contact him tomorrow.

## 2016-01-20 NOTE — Telephone Encounter (Signed)
Received verbal order from Dr. Brett Fairy to change the order for biap back to cpap set at 7-14 cm H2O. Per [facility], pt's insurance will not cover a bipap since there is not documentation of failed cpap in pt's sleep study. Will send order to [facility].

## 2016-01-26 ENCOUNTER — Telehealth: Payer: Self-pay | Admitting: *Deleted

## 2016-01-26 NOTE — Telephone Encounter (Signed)
-----   Message from Troy Sine, MD sent at 01/23/2016  9:24 AM EDT ----- Nl LV fxn; mild AR/MR

## 2016-01-26 NOTE — Telephone Encounter (Signed)
Left echo results and recommendations on home answering machine.  Call back if questions and or concerns.

## 2016-01-27 NOTE — Telephone Encounter (Signed)
I spoke to pt. I advised him that even if we switch DMEs, his insurance will not allow him a new cpap machine. Pt reports that his cpap machine is set at 7-14 cm H2O. Pt says that he is happy with [facility] and does not want to switch. I asked pt to call me back with any further questions/concerns. Pt verbalized understanding.

## 2016-01-27 NOTE — Telephone Encounter (Signed)
Pt called to advise [facility] told him his machine is 3-1/2 to 59 ys old and if was 71yrs it could be replaced. He is wanting to keep the machine he has. He said [facility] is not a preferred national provider and that is why he can not get CPAP free. He is wanting to know if there is another source where he could get it? He can be reached [phone]

## 2016-02-11 DIAGNOSIS — Z4800 Encounter for change or removal of nonsurgical wound dressing: Secondary | ICD-10-CM | POA: Diagnosis not present

## 2016-02-24 DIAGNOSIS — Z23 Encounter for immunization: Secondary | ICD-10-CM | POA: Diagnosis not present

## 2016-03-02 ENCOUNTER — Other Ambulatory Visit: Payer: Self-pay | Admitting: Cardiovascular Disease

## 2016-03-23 ENCOUNTER — Other Ambulatory Visit: Payer: Self-pay | Admitting: Cardiovascular Disease

## 2016-03-24 DIAGNOSIS — R0981 Nasal congestion: Secondary | ICD-10-CM | POA: Diagnosis not present

## 2016-03-24 DIAGNOSIS — R05 Cough: Secondary | ICD-10-CM | POA: Diagnosis not present

## 2016-03-29 DIAGNOSIS — J069 Acute upper respiratory infection, unspecified: Secondary | ICD-10-CM | POA: Diagnosis not present

## 2016-04-10 ENCOUNTER — Other Ambulatory Visit: Payer: Self-pay | Admitting: Cardiovascular Disease

## 2016-04-10 NOTE — Telephone Encounter (Signed)
REFILL 

## 2016-04-12 ENCOUNTER — Ambulatory Visit: Payer: Medicare Other | Admitting: Adult Health

## 2016-05-09 DIAGNOSIS — M1990 Unspecified osteoarthritis, unspecified site: Secondary | ICD-10-CM | POA: Diagnosis not present

## 2016-05-09 DIAGNOSIS — E785 Hyperlipidemia, unspecified: Secondary | ICD-10-CM | POA: Diagnosis not present

## 2016-05-09 DIAGNOSIS — E039 Hypothyroidism, unspecified: Secondary | ICD-10-CM | POA: Diagnosis not present

## 2016-05-09 DIAGNOSIS — Z933 Colostomy status: Secondary | ICD-10-CM | POA: Diagnosis not present

## 2016-05-09 DIAGNOSIS — G4733 Obstructive sleep apnea (adult) (pediatric): Secondary | ICD-10-CM | POA: Diagnosis not present

## 2016-05-09 DIAGNOSIS — I10 Essential (primary) hypertension: Secondary | ICD-10-CM | POA: Diagnosis not present

## 2016-05-09 DIAGNOSIS — I2510 Atherosclerotic heart disease of native coronary artery without angina pectoris: Secondary | ICD-10-CM | POA: Diagnosis not present

## 2016-05-15 ENCOUNTER — Other Ambulatory Visit: Payer: Self-pay | Admitting: Cardiovascular Disease

## 2016-05-30 DIAGNOSIS — G8929 Other chronic pain: Secondary | ICD-10-CM | POA: Diagnosis not present

## 2016-05-30 DIAGNOSIS — R29898 Other symptoms and signs involving the musculoskeletal system: Secondary | ICD-10-CM | POA: Diagnosis not present

## 2016-05-30 DIAGNOSIS — M25512 Pain in left shoulder: Secondary | ICD-10-CM | POA: Diagnosis not present

## 2016-06-02 DIAGNOSIS — M7552 Bursitis of left shoulder: Secondary | ICD-10-CM | POA: Diagnosis not present

## 2016-06-05 ENCOUNTER — Other Ambulatory Visit: Payer: Self-pay | Admitting: Cardiovascular Disease

## 2016-06-08 DIAGNOSIS — J302 Other seasonal allergic rhinitis: Secondary | ICD-10-CM | POA: Diagnosis not present

## 2016-06-12 DIAGNOSIS — N390 Urinary tract infection, site not specified: Secondary | ICD-10-CM | POA: Diagnosis not present

## 2016-06-12 DIAGNOSIS — M549 Dorsalgia, unspecified: Secondary | ICD-10-CM | POA: Diagnosis not present

## 2016-06-12 DIAGNOSIS — J302 Other seasonal allergic rhinitis: Secondary | ICD-10-CM | POA: Diagnosis not present

## 2016-06-12 DIAGNOSIS — J329 Chronic sinusitis, unspecified: Secondary | ICD-10-CM | POA: Diagnosis not present

## 2016-06-21 DIAGNOSIS — R05 Cough: Secondary | ICD-10-CM | POA: Diagnosis not present

## 2016-06-21 DIAGNOSIS — Z6825 Body mass index (BMI) 25.0-25.9, adult: Secondary | ICD-10-CM | POA: Diagnosis not present

## 2016-06-21 DIAGNOSIS — J3089 Other allergic rhinitis: Secondary | ICD-10-CM | POA: Diagnosis not present

## 2016-06-21 DIAGNOSIS — J302 Other seasonal allergic rhinitis: Secondary | ICD-10-CM | POA: Diagnosis not present

## 2016-06-27 DIAGNOSIS — J328 Other chronic sinusitis: Secondary | ICD-10-CM | POA: Diagnosis not present

## 2016-07-07 DIAGNOSIS — R5383 Other fatigue: Secondary | ICD-10-CM | POA: Diagnosis not present

## 2016-07-07 DIAGNOSIS — R6889 Other general symptoms and signs: Secondary | ICD-10-CM | POA: Diagnosis not present

## 2016-07-10 DIAGNOSIS — R6889 Other general symptoms and signs: Secondary | ICD-10-CM | POA: Diagnosis not present

## 2016-07-10 DIAGNOSIS — R5383 Other fatigue: Secondary | ICD-10-CM | POA: Diagnosis not present

## 2016-07-12 ENCOUNTER — Other Ambulatory Visit: Payer: Self-pay | Admitting: Cardiovascular Disease

## 2016-07-12 NOTE — Telephone Encounter (Signed)
Rx(s) sent to pharmacy electronically.  

## 2016-07-17 ENCOUNTER — Telehealth: Payer: Self-pay | Admitting: Cardiovascular Disease

## 2016-07-17 NOTE — Telephone Encounter (Signed)
Acknowledged.

## 2016-07-17 NOTE — Telephone Encounter (Signed)
Pt of Dr. [name] Hx CAD,  Returned patient call.  He states brief, 1-2 second sensation of pain in chest. Describes as the drawing of a slow checkmark. Identifies as mild, "3 out of 10, if I had to give it a number".  Denies other symptoms, no weakness, sob, nausea, dizziness, etc.  Had 3-4 incidents this morning. several incidents over last 2 days.  He denied need to take NTG for this.  Of note, he plays chair volleyball, sometimes gets muscle strain from time to time. States this is not same type of pain.   Scheduled pt for PA visit Wednesday (soonest available) - will see Lurena Joiner at [place] office at [place]. Pt understands if he has new symptoms to call.  Sent to Dr. Debara Pickett ([facility]) for any further recommendations.

## 2016-07-17 NOTE — Telephone Encounter (Signed)
I agree - sounds atypical. Follow-up with [facility].  Dr. Lemmie Evens

## 2016-07-17 NOTE — Telephone Encounter (Signed)
Pt c/o of Chest Pain: STAT if CP now or developed within 24 hours  1. Are you having CP right now? No  2. Are you experiencing any other symptoms (ex. SOB, nausea, vomiting, sweating)? No other symptoms reported  3. How long have you been experiencing Cp? Saturday, Sunday and this morning.  4. Is your CP continuous or coming and going? Coming and going  5. Have you taken Nitroglycerin? N/A  Patient described pain in chest as "painful checkmark" and stated it was not very painful and it was quick. Patient stated he experienced it this weekend and once this morning. Patient will be away from home until around 9:30am this morning. Thanks. ?

## 2016-07-19 ENCOUNTER — Encounter: Payer: Self-pay | Admitting: Cardiology

## 2016-07-19 ENCOUNTER — Ambulatory Visit (INDEPENDENT_AMBULATORY_CARE_PROVIDER_SITE_OTHER): Payer: Medicare Other | Admitting: Cardiology

## 2016-07-19 VITALS — BP 108/60 | HR 56 | Ht 68.0 in | Wt 163.4 lb

## 2016-07-19 DIAGNOSIS — R0609 Other forms of dyspnea: Secondary | ICD-10-CM

## 2016-07-19 DIAGNOSIS — G4733 Obstructive sleep apnea (adult) (pediatric): Secondary | ICD-10-CM

## 2016-07-19 DIAGNOSIS — R079 Chest pain, unspecified: Secondary | ICD-10-CM

## 2016-07-19 DIAGNOSIS — E785 Hyperlipidemia, unspecified: Secondary | ICD-10-CM | POA: Diagnosis not present

## 2016-07-19 DIAGNOSIS — R0989 Other specified symptoms and signs involving the circulatory and respiratory systems: Secondary | ICD-10-CM | POA: Insufficient documentation

## 2016-07-19 DIAGNOSIS — Z9989 Dependence on other enabling machines and devices: Secondary | ICD-10-CM | POA: Diagnosis not present

## 2016-07-19 DIAGNOSIS — I2510 Atherosclerotic heart disease of native coronary artery without angina pectoris: Secondary | ICD-10-CM

## 2016-07-19 DIAGNOSIS — R001 Bradycardia, unspecified: Secondary | ICD-10-CM | POA: Diagnosis not present

## 2016-07-19 DIAGNOSIS — Z9861 Coronary angioplasty status: Secondary | ICD-10-CM

## 2016-07-19 NOTE — Assessment & Plan Note (Signed)
RCA and CFX DES 2007 Myoview normal 2015

## 2016-07-19 NOTE — Progress Notes (Signed)
07/19/2016 Anthony Petty   1928-01-10  [id number]  Primary Physician Pcp Not In System Primary Cardiologist: Dr [name]  HPI:  83 y/o male with a history of CAD seen in the office today with his wife. He presents with complaints of chest pain and exertional fatigue. The pt and his wife live at [facility]. They have a PCP there who they see on a regular basis. The pt has a history of CAD, s/p RCA and CFX PCI with stenting in 2007. He had a Myoview in 2015 that was normal. Echo in Aug 2017 was normal. His LOV with Dr [name] was in Jan 2017.   The pt says he has noticed episodes of chest pain over the past 5-6 days. He describes a brief chest pain that starts on the lt side and goes to the right. He walks for exercise and notes that he has been more fatigued than usual. He has not taken NTG. In 2007 his symptoms were exertional fatigue.    Current Outpatient Prescriptions  Medication Sig Dispense Refill  . amLODipine (NORVASC) 5 MG tablet Take 0.5 tablets (2.5 mg total) by mouth daily. <PLEASE MAKE APPOINTMENT FOR REFILLS> 45 tablet 0  . aspirin 81 MG tablet Take 81 mg by mouth daily.    . Cholecalciferol (VITAMIN D3) 400 UNITS CAPS Take 1 capsule by mouth daily.    . clopidogrel (PLAVIX) 75 MG tablet TAKE ONE (1) TABLET EACH DAY 90 tablet 1  . co-enzyme Q-10 30 MG capsule Take 30 mg by mouth daily.    . fish oil-omega-3 fatty acids 1000 MG capsule Take 2 g by mouth daily.    . Glucosamine-Chondroit-Vit C-Mn (GLUCOSAMINE 1500 COMPLEX PO) Take 1 capsule by mouth daily.    . isosorbide mononitrate (IMDUR) 30 MG 24 hr tablet TAKE ONE (1) TABLET EACH DAY 90 tablet 0  . levothyroxine (SYNTHROID, LEVOTHROID) 25 MCG tablet Take 25 mcg by mouth daily before breakfast.    . Misc Natural Products (TURMERIC CURCUMIN) CAPS Take 1 capsule by mouth daily.    . nitroGLYCERIN (NITROSTAT) 0.4 MG SL tablet Place 1 tablet (0.4 mg total) under the tongue every 5 (five) minutes as needed for chest pain. 25  tablet 3  . NON FORMULARY CPAP therapy    . saw palmetto 160 MG capsule Take 160 mg by mouth daily.    . tamsulosin (FLOMAX) 0.4 MG CAPS capsule Take 1 capsule (0.4 mg total) by mouth daily. 90 capsule 3  . VYTORIN 10-20 MG tablet TAKE ONE (1) TABLET EACH DAY AT BEDTIME 90 tablet 0   No current facility-administered medications for this visit.     No Known Allergies  Social History   Social History  . Marital status: Married    Spouse name: N/A  . Number of children: N/A  . Years of education: N/A   Occupational History  . Not on file.   Social History Main Topics  . Smoking status: Never Smoker  . Smokeless tobacco: Never Used  . Alcohol use 0.0 oz/week     Comment: ocassional glass of wine.  . Drug use: Unknown  . Sexual activity: Not on file   Other Topics Concern  . Not on file   Social History Narrative  . No narrative on file     Review of Systems: General: negative for chills, fever, night sweats or weight changes.  Cardiovascular: negative for dyspnea on exertion, edema, orthopnea, palpitations, paroxysmal nocturnal dyspnea or shortness of breath Dermatological: negative  for rash Respiratory: negative for cough or wheezing Urologic: negative for hematuria Abdominal: negative for nausea, vomiting, diarrhea, bright red blood per rectum, melena, or hematemesis Neurologic: negative for visual changes, syncope, or dizziness All other systems reviewed and are otherwise negative except as noted above.    Blood pressure 108/60, pulse (!) 56, height 5\' 8"  (1.727 m), weight 163 lb 6.4 oz (74.1 kg).  General appearance: alert, cooperative and no distress Neck: no JVD and Lt carotid bruit Lungs: clear to auscultation bilaterally Heart: regular rate and rhythm Abdomen: soft, non-tender; bowel sounds normal; no masses,  no organomegaly and mid line surgical scar Extremities: extremities normal, atraumatic, no cyanosis or edema Pulses: 2+ and symmetric Skin: Skin  color, texture, turgor normal. No rashes or lesions Neurologic: Grossly normal  EKG NSR, SB  ASSESSMENT AND PLAN:   Chest pain at rest Pt has chest pain that sounds atypical but he also has exertional fatigue that sounds similar to his presentation in 2007.  CAD S/P percutaneous coronary angioplasty RCA and CFX DES 2007 Myoview normal 2015  Bradycardia Beta blocker intolerant  OSA on CPAP Compliant  Hyperlipidemia with target LDL less than 70 Followed by PCP  Left carotid bruit Asymptomatic   PLAN  I suggested we repeat his Myoview. I also ordered a carotid doppler. He will follow up with Dr [name] in a few months, after he has had his annual physical with his PCP. I asked him to bring copies of his lipid panel when he sees Dr [name].   Kerin Ransom PA-C 07/19/2016 8:27 AM

## 2016-07-19 NOTE — Assessment & Plan Note (Signed)
Followed by PCP

## 2016-07-19 NOTE — Assessment & Plan Note (Signed)
Pt has chest pain that sounds atypical but he also has exertional fatigue that sounds similar to his presentation in 2007.

## 2016-07-19 NOTE — Assessment & Plan Note (Signed)
Beta blocker intolerant

## 2016-07-19 NOTE — Assessment & Plan Note (Signed)
Compliant 

## 2016-07-19 NOTE — Assessment & Plan Note (Signed)
Asymptomatic. 

## 2016-07-19 NOTE — Patient Instructions (Signed)
Medication Instructions:  The current medical regimen is effective;  continue present plan and medications.  If you need a refill on your cardiac medications before your next appointment, please call your pharmacy.  Labwork: NONE  Testing/Procedures: Your physician has requested that you have a carotid duplex. This test is an ultrasound of the carotid arteries in your neck. It looks at blood flow through these arteries that supply the brain with blood. Allow one hour for this exam. There are no restrictions or special instructions.  Your physician has requested that you have a lexiscan myoview. For further information please visit [email or web address]. Please follow instruction sheet, as given.  Follow-Up: Your physician recommends that you schedule a follow-up appointment in: [place]   Thank you for choosing [facility] at [facility]!!    Kerby Moors, LPN

## 2016-08-09 ENCOUNTER — Encounter (HOSPITAL_COMMUNITY): Payer: Medicare Other

## 2016-08-09 DIAGNOSIS — B351 Tinea unguium: Secondary | ICD-10-CM | POA: Diagnosis not present

## 2016-08-09 DIAGNOSIS — I480 Paroxysmal atrial fibrillation: Secondary | ICD-10-CM | POA: Diagnosis not present

## 2016-08-09 DIAGNOSIS — L608 Other nail disorders: Secondary | ICD-10-CM | POA: Diagnosis not present

## 2016-08-09 DIAGNOSIS — Z6825 Body mass index (BMI) 25.0-25.9, adult: Secondary | ICD-10-CM | POA: Diagnosis not present

## 2016-08-09 DIAGNOSIS — Z1211 Encounter for screening for malignant neoplasm of colon: Secondary | ICD-10-CM | POA: Diagnosis not present

## 2016-08-09 DIAGNOSIS — I10 Essential (primary) hypertension: Secondary | ICD-10-CM | POA: Diagnosis not present

## 2016-08-22 DIAGNOSIS — Z1211 Encounter for screening for malignant neoplasm of colon: Secondary | ICD-10-CM | POA: Diagnosis not present

## 2016-08-22 DIAGNOSIS — C189 Malignant neoplasm of colon, unspecified: Secondary | ICD-10-CM | POA: Diagnosis not present

## 2016-08-23 ENCOUNTER — Telehealth (HOSPITAL_COMMUNITY): Payer: Self-pay

## 2016-08-23 NOTE — Telephone Encounter (Signed)
Encounter complete. 

## 2016-08-25 ENCOUNTER — Ambulatory Visit (HOSPITAL_COMMUNITY)
Admission: RE | Admit: 2016-08-25 | Discharge: 2016-08-25 | Disposition: A | Discharge status: Home / Self Care | Payer: Medicare Other | Source: Ambulatory Visit | Attending: Internal Medicine | Admitting: Internal Medicine

## 2016-08-25 ENCOUNTER — Telehealth: Payer: Self-pay | Admitting: Cardiovascular Disease

## 2016-08-25 DIAGNOSIS — I779 Disorder of arteries and arterioles, unspecified: Secondary | ICD-10-CM | POA: Insufficient documentation

## 2016-08-25 DIAGNOSIS — R0609 Other forms of dyspnea: Secondary | ICD-10-CM | POA: Diagnosis not present

## 2016-08-25 DIAGNOSIS — I2510 Atherosclerotic heart disease of native coronary artery without angina pectoris: Secondary | ICD-10-CM | POA: Diagnosis not present

## 2016-08-25 DIAGNOSIS — E785 Hyperlipidemia, unspecified: Secondary | ICD-10-CM | POA: Insufficient documentation

## 2016-08-25 DIAGNOSIS — R079 Chest pain, unspecified: Secondary | ICD-10-CM | POA: Insufficient documentation

## 2016-08-25 DIAGNOSIS — I6523 Occlusion and stenosis of bilateral carotid arteries: Secondary | ICD-10-CM | POA: Diagnosis not present

## 2016-08-25 DIAGNOSIS — G4733 Obstructive sleep apnea (adult) (pediatric): Secondary | ICD-10-CM | POA: Insufficient documentation

## 2016-08-25 DIAGNOSIS — R5383 Other fatigue: Secondary | ICD-10-CM | POA: Diagnosis not present

## 2016-08-25 DIAGNOSIS — R0989 Other specified symptoms and signs involving the circulatory and respiratory systems: Secondary | ICD-10-CM | POA: Diagnosis not present

## 2016-08-25 LAB — MYOCARDIAL PERFUSION IMAGING
LV dias vol: 93 mL (ref 62–150)
LV sys vol: 37 mL
Peak HR: 78 {beats}/min
Rest HR: 50 {beats}/min
SDS: 1
SSS: 1
TID: 0.99

## 2016-08-25 MED ORDER — REGADENOSON 0.4 MG/5ML IV SOLN
0.4000 mg | Freq: Once | INTRAVENOUS | Status: AC
  Administered 2016-08-25: 0.4 mg via INTRAVENOUS

## 2016-08-25 MED ORDER — TECHNETIUM TC 99M TETROFOSMIN IV KIT
11.0000 | PACK | Freq: Once | INTRAVENOUS | Status: AC | PRN
  Administered 2016-08-25: 11 via INTRAVENOUS
  Filled 2016-08-25: qty 11

## 2016-08-25 MED ORDER — TECHNETIUM TC 99M TETROFOSMIN IV KIT
31.2000 | PACK | Freq: Once | INTRAVENOUS | Status: AC | PRN
  Administered 2016-08-25: 31.2 via INTRAVENOUS
  Filled 2016-08-25: qty 32

## 2016-08-25 MED ORDER — AMINOPHYLLINE 25 MG/ML IV SOLN
75.0000 mg | Freq: Once | INTRAVENOUS | Status: AC
  Administered 2016-08-25: 75 mg via INTRAVENOUS

## 2016-08-25 NOTE — Telephone Encounter (Signed)
Discussed w patient, he'll plan to f/u as scheduled if no new concerns, and sooner than May if problems as advised by [facility].

## 2016-08-25 NOTE — Telephone Encounter (Signed)
New message    Pt is calling to find out if he should keep appt in May with Dr. [name]. He says that today when he was here, he was told he would not need follow up.

## 2016-08-29 ENCOUNTER — Telehealth: Payer: Self-pay | Admitting: *Deleted

## 2016-08-29 NOTE — Telephone Encounter (Signed)
Left msg w results and instructions to call if questions.

## 2016-08-29 NOTE — Telephone Encounter (Signed)
-----   Message from Erlene Quan, [place] sent at 08/27/2016 10:38 AM EDT ----- Please let the pt know his dopplers looked good, mild narrowing, continue ASA and statin  LUKE KILROY PA-C 08/27/2016 10:38 AM

## 2016-09-14 DIAGNOSIS — Z6823 Body mass index (BMI) 23.0-23.9, adult: Secondary | ICD-10-CM | POA: Diagnosis not present

## 2016-09-14 DIAGNOSIS — N138 Other obstructive and reflux uropathy: Secondary | ICD-10-CM | POA: Diagnosis not present

## 2016-09-14 DIAGNOSIS — N401 Enlarged prostate with lower urinary tract symptoms: Secondary | ICD-10-CM | POA: Diagnosis not present

## 2016-09-14 DIAGNOSIS — R972 Elevated prostate specific antigen [PSA]: Secondary | ICD-10-CM | POA: Diagnosis not present

## 2016-10-09 ENCOUNTER — Other Ambulatory Visit: Payer: Self-pay | Admitting: Cardiovascular Disease

## 2016-10-09 NOTE — Telephone Encounter (Signed)
REFILL 

## 2016-10-19 ENCOUNTER — Encounter: Payer: Self-pay | Admitting: Cardiovascular Disease

## 2016-10-19 ENCOUNTER — Ambulatory Visit (INDEPENDENT_AMBULATORY_CARE_PROVIDER_SITE_OTHER): Payer: Medicare Other | Admitting: Cardiovascular Disease

## 2016-10-19 ENCOUNTER — Telehealth: Payer: Self-pay | Admitting: Cardiovascular Disease

## 2016-10-19 VITALS — BP 125/67 | HR 51 | Ht 68.0 in | Wt 166.0 lb

## 2016-10-19 DIAGNOSIS — R001 Bradycardia, unspecified: Secondary | ICD-10-CM | POA: Diagnosis not present

## 2016-10-19 DIAGNOSIS — I7300 Raynaud's syndrome without gangrene: Secondary | ICD-10-CM | POA: Diagnosis not present

## 2016-10-19 DIAGNOSIS — I779 Disorder of arteries and arterioles, unspecified: Secondary | ICD-10-CM | POA: Diagnosis not present

## 2016-10-19 DIAGNOSIS — I2510 Atherosclerotic heart disease of native coronary artery without angina pectoris: Secondary | ICD-10-CM | POA: Diagnosis not present

## 2016-10-19 DIAGNOSIS — I739 Peripheral vascular disease, unspecified: Secondary | ICD-10-CM

## 2016-10-19 DIAGNOSIS — Z9861 Coronary angioplasty status: Secondary | ICD-10-CM

## 2016-10-19 NOTE — Patient Instructions (Addendum)
Your physician wants you to follow-up in: 1 year or sooner if needed. You will receive a reminder letter in the mail two months in advance. If you don't receive a letter, please call our office to schedule the follow-up appointment.   If you need a refill on your cardiac medications before your next appointment, please call your pharmacy.   Please have your facility fax your recent blood work to [phone].

## 2016-10-19 NOTE — Progress Notes (Signed)
Patient ID: Anthony Petty, male   DOB: 1928/02/23, 83 y.o.   MRN: [id number]   Primary M.D.: Dr. Antony Petty, [facility]  HPI: Anthony Petty is a 83 y.o. male who presents for a 2 month follow-up cardiology evaluation.   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 He has known CAD and cardiac catheterization in 2007 showed total occlusion of the circumflex vessel as well as high-grade AV groove circumflex and RCA disease for which he underwent stenting of his RCA and recanalization of a totally occluded circumflex and marginal system. Additional problems include paroxysmal atrial fibrillation. An echo Doppler study in August 2012 showed mild LVH with normal systolic function and grade 1 diastolic dysfunction. He had mild aortic sclerosis with mild-to-moderate aortic insufficiency, mild MR, mild TR and PR. There is mild pulmonary hypertension with an estimated PA pressure 35 mm. His last stress test was in October 2013 prior to a trip to [place] when he had experienced some vague symptoms of chest pain. The nuclear perfusion study was unchanged and continued to show fairly normal perfusion. He also has a history of obstructive sleep apnea for which he sees Dr. Keturah Petty.  He has had issues of occasional palpitations, weakness and fatigue. His beta blocker dose was reduced from Lopressor 12.5 twice a day to 12.5 mg daily and last year had a CardioNet monitor.  CardioNet monitor demonstrated a 4 beat episode of PSVT. He also complained of "tingling "in the left upper chest region with inspiration.  He underwent a follow-up nuclear study which continued to reveal normal perfusion without scar or ischemia.   When I saw him in January 2015 complained that his fingers turning white particularly with cold weather on a routine basis. He has to wear gloves the entire winter season and at times there may be transient red and blue discoloration.  I was  concerned about the possibility of Raynaud's versus digital artery vasospasm and elected to start him on low-dose amlodipine at 2.5 mg which improved some of his symptomatology.  He has developed bradycardia in the past which was felt to be due to development of sinus node dysfunction.  He also was found to have an elevated PSA and underwent urologic evaluation.    Since I last saw him in January 2017, he underwent an echo Doppler study on 01/17/2016.  This showed an EF of 60-65%.  He had grade 1 diastolic dysfunction.  There was mild aortic insufficiency.  There was mitral annular calcification.  He was seen by Anthony Petty in February 2018 with complaints of some mild chest pain and exertional fatigue.  He underwent a nuclear perfusion study on 08/25/2016 which was normal.  Ejection fraction was 61%.  There were no ECG changes.  There was no evidence for scar or ischemia.  He also underwent carotid duplex imaging which showed mild header genius plaque with very mild narrowing in the 1-39% range.  Antegrade flow in his vertebrals.  He denies any recent chest tightness.  He lives at [facility].  His new primary physician is now Dr. Antony Petty at [facility] who has checked laboratory.  He presents for follow-up evaluation.  Past Medical History:  Diagnosis Date  . Colon cancer ([place]) 1987  . Coronary artery disease   . History of bacterial endocarditis 1980, 1990s  . Hyperlipidemia   . OSA on CPAP     Past Surgical History:  Procedure Laterality Date  . CARDIAC CATHETERIZATION  08/07/2005   40-50% narrowing in LAD, 30-40% scattered irregularity of diagonal, subtotal-total prox Cfx occlusion w/collaterals of marginal 1, prox 30-40% RCA narrowing, 40-50% distal RCA stenosis (Dr. Corky Petty)  . CORONARY ANGIOPLASTY WITH STENT PLACEMENT  08/15/2005   stent to RCA and prox Cfx with 3x25mm DES to AV groove Cfx and 2.5x13 Cypher DES to marginal (Dr. Corky Petty)  . KNEE SURGERY  2004   right knee  . NM  MYOCAR PERF WALL MOTION  02/2012   lexiscan - normal perfusion, EF 66%, low risk   . SUBTOTAL COLECTOMY     in [place] r/t adenocarcinoma of colon   . TRANSTHORACIC ECHOCARDIOGRAM  12/2010   EF=>55%, mild conc LVH; borderline LA enlargement; calcification of anterior MV leaflets, mild MR; mild TR; RVSP 30-31mmHg; mild calcification of AV leaflets and mild-mod AV regurg; mild pulm regurg; aortic root sclerosis/calcification    No Known Allergies  Current Outpatient Prescriptions  Medication Sig Dispense Refill  . amLODipine (NORVASC) 5 MG tablet TAKE 1/2 TABLET DAILY 45 tablet 1  . aspirin 81 MG tablet Take 81 mg by mouth daily.    . Cholecalciferol (VITAMIN D3) 400 UNITS CAPS Take 1 capsule by mouth daily.    . clopidogrel (PLAVIX) 75 MG tablet TAKE ONE (1) TABLET EACH DAY 90 tablet 1  . co-enzyme Q-10 30 MG capsule Take 30 mg by mouth daily.    . fish oil-omega-3 fatty acids 1000 MG capsule Take 2 g by mouth daily.    . Glucosamine-Chondroit-Vit C-Mn (GLUCOSAMINE 1500 COMPLEX PO) Take 1 capsule by mouth daily.    . isosorbide mononitrate (IMDUR) 30 MG 24 hr tablet TAKE ONE (1) TABLET EACH DAY 90 tablet 0  . levothyroxine (SYNTHROID, LEVOTHROID) 25 MCG tablet Take 25 mcg by mouth daily before breakfast.    . Misc Natural Products (TURMERIC CURCUMIN) CAPS Take 1 capsule by mouth daily.    . nitroGLYCERIN (NITROSTAT) 0.4 MG SL tablet Place 1 tablet (0.4 mg total) under the tongue every 5 (five) minutes as needed for chest pain. 25 tablet 3  . NON FORMULARY CPAP therapy    . saw palmetto 160 MG capsule Take 160 mg by mouth daily.    . tamsulosin (FLOMAX) 0.4 MG CAPS capsule Take 1 capsule (0.4 mg total) by mouth daily. 90 capsule 3  . VYTORIN 10-20 MG tablet TAKE ONE (1) TABLET EACH DAY AT BEDTIME 90 tablet 0   No current facility-administered medications for this visit.     Social History   Social History  . Marital status: Married    Spouse name: N/A  . Number of children: N/A  .  Years of education: N/A   Occupational History  . Not on file.   Social History Main Topics  . Smoking status: Never Smoker  . Smokeless tobacco: Never Used  . Alcohol use 0.0 oz/week     Comment: ocassional glass of wine.  . Drug use: Unknown  . Sexual activity: Not on file   Other Topics Concern  . Not on file   Social History Narrative  . No narrative on file   Additional social history is notable that he is originally from [place]. He has 3 children 2 grandchildren. He does exercise. There is no alcohol or tobacco use.  Parents are deceased.  ROS General: Negative; No fevers, chills, or night sweats;  HEENT: Negative; No changes in vision or hearing, sinus congestion, difficulty swallowing Pulmonary: Negative; No cough, wheezing, shortness of breath, hemoptysis Cardiovascular:  Negative; No chest pain, presyncope, syncope, palpitations GI: Negative; No nausea, vomiting, diarrhea, or abdominal pain GU: Negative; No dysuria, hematuria, or difficulty voiding Musculoskeletal: Negative; no myalgias, joint pain, or weakness Hematologic/Oncology: Negative; no easy bruising, bleeding Endocrine: Negative; no heat/cold intolerance; no diabetes Neuro: Negative; no changes in balance, headaches Skin: Negative; No rashes or skin lesions Psychiatric: Negative; No behavioral problems, depression Sleep: Positive for obstructive sleep apnea, followed by Dr. Annamaria Boots; No snoring, daytime sleepiness, hypersomnolence, bruxism, restless legs, hypnogognic hallucinations, no cataplexy Other comprehensive 14 point system review is negative.   PE BP 125/67   Pulse (!) 51   Ht 5\' 8"  (1.727 m)   Wt 166 lb (75.3 kg)   BMI 25.24 kg/m    Repeat blood pressure 122/68  Wt Readings from Last 3 Encounters:  10/19/16 166 lb (75.3 kg)  08/25/16 163 lb (73.9 kg)  07/19/16 163 lb 6.4 oz (74.1 kg)   General: Alert, oriented, no distress.  Skin: normal turgor, no rashes, warm and dry HEENT:  Normocephalic, atraumatic. Pupils equal round and reactive to light; sclera anicteric; extraocular muscles intact;  Nose without nasal septal hypertrophy Mouth/Parynx benign; Mallinpatti scale 3 Neck: No JVD, no carotid bruits; normal carotid upstroke Lungs: clear to ausculatation and percussion; no wheezing or rales Chest wall: without tenderness to palpitation Heart: PMI not displaced, RRR, s1 s2 normal, no S3 gallop 1/6 systolic murmur, wiff of aortic insufficiency, no rubs, gallops, thrills, or heaves Abdomen: soft, nontender; no hepatosplenomehaly, BS+; abdominal aorta nontender and not dilated by palpation. Back: no CVA tenderness Pulses 2+ Musculoskeletal: full range of motion, normal strength, no joint deformities Extremities: no clubbing cyanosis or edema, Homan's sign negative  Neurologic: grossly nonfocal; Cranial nerves grossly wnl Psychologic: Normal mood and affect   ECG (independently read by me): Sinus bradycardia 51 bpm.  Normal intervals.  No ST segment changes.  January 2017 ECG (independently read by me):  Sinus bradycardia 53 bpm.  No significant ST segment changes.  November 2016 ECG (independently read by me): Sinus bradycardia at 48 bpm.  April 2016 ECG (independently read by me): Sinus bradycardia at 51 bpm.  No ectopy.  Normal intervals.  October 2015 ECG (independently read by me): Sinus bradycardia 58 beats per minute.  Normal intervals.  No ST segment changes.  LABS:  BMP 07/22/2007 07/21/2007 07/20/2007  Glucose 120(H) 112(H) 90  BUN 8 7 5(L)  Creatinine 1.30 1.25 1.20  Sodium 138 137 141  Potassium 3.8 3.9 4.0  Chloride 106 106 108  CO2 25 25 24   Calcium 9.1 9.1 9.3      Component Value Date/Time   PROT 6.4 07/21/2007 0503   ALBUMIN 3.2 (L) 07/21/2007 0503   AST 67 (H) 07/21/2007 0503   ALT 132 (H) 07/21/2007 0503   ALKPHOS 74 07/21/2007 0503   BILITOT 2.0 (H) 07/21/2007 0503       Component Value Date/Time   WBC 6.9 07/21/2007 0503   RBC  4.48 07/21/2007 0503   HGB 12.9 (L) 07/21/2007 0503   HCT 38.1 (L) 07/21/2007 0503   PLT 155 07/21/2007 0503   MCV 85.0 07/21/2007 0503   MCHC 33.9 07/21/2007 0503   RDW 14.1 07/21/2007 0503   LYMPHSABS 1.7 07/17/2007 1200   MONOABS 0.6 07/17/2007 1200   EOSABS 0.0 07/17/2007 1200   BASOSABS 0.0 07/17/2007 1200     BNP No results found for: PROBNP  Lipid Panel  No results found for: CHOL   RADIOLOGY: No results found.  IMPRESSION:  1. CAD S/P percutaneous coronary angioplasty   2. CAD in native artery   3. Raynaud's phenomenon without gangrene   4. Bilateral carotid artery disease ([place])   5. Sinus bradycardia     ASSESSMENT AND PLAN: Anthony Petty is an 83 year old gentleman who has a remote history of endocarditis x2 initially in 67 and subsequently into early 1990s. He is 9 years status post intervention to his RCA and recanalization of the circumflex system which was noted to be occluded with collateralization from the LAD to the OM prior to intervention. He has a 30x28 mm Cypher stent in the AV groove and 2.5x13 mm Cypher stent in the OM1 ostium. The RCA has a 3.0x23 mm Cypher stent in its midsegment.  He had developed probable sinus node dysfunction leading to discontinuance of beta blocker therapy.  He continues to be bradycardic with a heart rate 51 on his ECG today.  He has not had recurrent anginal symptomatology nor has he experienced any episodes of palpitations.  He continues to be on isosorbide 30 mg and amlodipine 2.5 mg, which is helpful both for his CAD, as well as for possible Raynaud's phenomenon.  He has a systolic murmur in the aortic region.  I reviewed his most recent echo Doppler study from August 2017 which showed mild AR and mild MR with normal LV function.  There was no evidence for aortic stenosis.  His perfusion study shows entirely normal perfusion in this patient status post remote intervention and with a recanalized circumflex.  He will continue  current therapy.  I'll see him in one year for reevaluation.  Time spent: 25 minutes Anthony Sine, MD, [facility]  10/21/2016 4:01 PM

## 2016-10-19 NOTE — Telephone Encounter (Signed)
New message    Pt is calling to let Rn know that the care staff at [facility] state all the information will be in [place] in Epic.

## 2016-10-19 NOTE — Telephone Encounter (Signed)
Message received and routed to the physician for their knowledge.

## 2016-10-23 NOTE — Telephone Encounter (Signed)
ok 

## 2016-11-13 ENCOUNTER — Other Ambulatory Visit: Payer: Self-pay | Admitting: Cardiovascular Disease

## 2016-11-27 DIAGNOSIS — Z8679 Personal history of other diseases of the circulatory system: Secondary | ICD-10-CM | POA: Diagnosis not present

## 2016-12-18 DIAGNOSIS — K219 Gastro-esophageal reflux disease without esophagitis: Secondary | ICD-10-CM | POA: Diagnosis not present

## 2017-01-01 DIAGNOSIS — H5203 Hypermetropia, bilateral: Secondary | ICD-10-CM | POA: Diagnosis not present

## 2017-01-01 DIAGNOSIS — Z961 Presence of intraocular lens: Secondary | ICD-10-CM | POA: Diagnosis not present

## 2017-01-01 DIAGNOSIS — H52203 Unspecified astigmatism, bilateral: Secondary | ICD-10-CM | POA: Diagnosis not present

## 2017-01-01 DIAGNOSIS — H524 Presbyopia: Secondary | ICD-10-CM | POA: Diagnosis not present

## 2017-01-24 DIAGNOSIS — M545 Low back pain: Secondary | ICD-10-CM | POA: Diagnosis not present

## 2017-02-14 DIAGNOSIS — I10 Essential (primary) hypertension: Secondary | ICD-10-CM | POA: Diagnosis not present

## 2017-02-14 DIAGNOSIS — R6889 Other general symptoms and signs: Secondary | ICD-10-CM | POA: Diagnosis not present

## 2017-02-14 DIAGNOSIS — E785 Hyperlipidemia, unspecified: Secondary | ICD-10-CM | POA: Diagnosis not present

## 2017-02-14 DIAGNOSIS — R5383 Other fatigue: Secondary | ICD-10-CM | POA: Diagnosis not present

## 2017-02-14 DIAGNOSIS — E039 Hypothyroidism, unspecified: Secondary | ICD-10-CM | POA: Diagnosis not present

## 2017-02-14 DIAGNOSIS — Z0000 Encounter for general adult medical examination without abnormal findings: Secondary | ICD-10-CM | POA: Diagnosis not present

## 2017-02-20 DIAGNOSIS — I480 Paroxysmal atrial fibrillation: Secondary | ICD-10-CM | POA: Diagnosis not present

## 2017-02-20 DIAGNOSIS — Z9989 Dependence on other enabling machines and devices: Secondary | ICD-10-CM | POA: Diagnosis not present

## 2017-02-20 DIAGNOSIS — E039 Hypothyroidism, unspecified: Secondary | ICD-10-CM | POA: Diagnosis not present

## 2017-02-20 DIAGNOSIS — I10 Essential (primary) hypertension: Secondary | ICD-10-CM | POA: Diagnosis not present

## 2017-02-20 DIAGNOSIS — G4733 Obstructive sleep apnea (adult) (pediatric): Secondary | ICD-10-CM | POA: Diagnosis not present

## 2017-02-20 DIAGNOSIS — E782 Mixed hyperlipidemia: Secondary | ICD-10-CM | POA: Diagnosis not present

## 2017-02-20 DIAGNOSIS — I2510 Atherosclerotic heart disease of native coronary artery without angina pectoris: Secondary | ICD-10-CM | POA: Diagnosis not present

## 2017-03-20 DIAGNOSIS — Z08 Encounter for follow-up examination after completed treatment for malignant neoplasm: Secondary | ICD-10-CM | POA: Diagnosis not present

## 2017-03-20 DIAGNOSIS — D485 Neoplasm of uncertain behavior of skin: Secondary | ICD-10-CM | POA: Diagnosis not present

## 2017-03-20 DIAGNOSIS — Z85828 Personal history of other malignant neoplasm of skin: Secondary | ICD-10-CM | POA: Diagnosis not present

## 2017-03-20 DIAGNOSIS — L820 Inflamed seborrheic keratosis: Secondary | ICD-10-CM | POA: Diagnosis not present

## 2017-04-10 DIAGNOSIS — R112 Nausea with vomiting, unspecified: Secondary | ICD-10-CM | POA: Diagnosis not present

## 2017-04-10 DIAGNOSIS — A084 Viral intestinal infection, unspecified: Secondary | ICD-10-CM | POA: Diagnosis not present

## 2017-04-27 DIAGNOSIS — H00013 Hordeolum externum right eye, unspecified eyelid: Secondary | ICD-10-CM | POA: Diagnosis not present

## 2017-05-04 DIAGNOSIS — H01002 Unspecified blepharitis right lower eyelid: Secondary | ICD-10-CM | POA: Diagnosis not present

## 2017-05-04 DIAGNOSIS — H01004 Unspecified blepharitis left upper eyelid: Secondary | ICD-10-CM | POA: Diagnosis not present

## 2017-05-04 DIAGNOSIS — H01005 Unspecified blepharitis left lower eyelid: Secondary | ICD-10-CM | POA: Diagnosis not present

## 2017-05-04 DIAGNOSIS — H01001 Unspecified blepharitis right upper eyelid: Secondary | ICD-10-CM | POA: Diagnosis not present

## 2017-05-08 DIAGNOSIS — M1711 Unilateral primary osteoarthritis, right knee: Secondary | ICD-10-CM | POA: Diagnosis not present

## 2017-05-08 DIAGNOSIS — M65861 Other synovitis and tenosynovitis, right lower leg: Secondary | ICD-10-CM | POA: Diagnosis not present

## 2017-06-11 ENCOUNTER — Emergency Department (HOSPITAL_BASED_OUTPATIENT_CLINIC_OR_DEPARTMENT_OTHER): Payer: Medicare Other

## 2017-06-11 ENCOUNTER — Other Ambulatory Visit: Payer: Self-pay

## 2017-06-11 ENCOUNTER — Encounter (HOSPITAL_BASED_OUTPATIENT_CLINIC_OR_DEPARTMENT_OTHER): Payer: Self-pay | Admitting: Emergency Medicine

## 2017-06-11 ENCOUNTER — Inpatient Hospital Stay (HOSPITAL_BASED_OUTPATIENT_CLINIC_OR_DEPARTMENT_OTHER)
Admission: EM | Admit: 2017-06-11 | Discharge: 2017-06-14 | DRG: 247 | Disposition: A | Discharge status: Home / Self Care | Payer: Medicare Other | Attending: Cardiology | Admitting: Cardiology

## 2017-06-11 DIAGNOSIS — Z79890 Hormone replacement therapy (postmenopausal): Secondary | ICD-10-CM | POA: Diagnosis not present

## 2017-06-11 DIAGNOSIS — R001 Bradycardia, unspecified: Secondary | ICD-10-CM | POA: Diagnosis present

## 2017-06-11 DIAGNOSIS — R002 Palpitations: Secondary | ICD-10-CM | POA: Diagnosis present

## 2017-06-11 DIAGNOSIS — I480 Paroxysmal atrial fibrillation: Secondary | ICD-10-CM | POA: Diagnosis present

## 2017-06-11 DIAGNOSIS — Z7982 Long term (current) use of aspirin: Secondary | ICD-10-CM | POA: Diagnosis not present

## 2017-06-11 DIAGNOSIS — Z9049 Acquired absence of other specified parts of digestive tract: Secondary | ICD-10-CM | POA: Diagnosis not present

## 2017-06-11 DIAGNOSIS — R9439 Abnormal result of other cardiovascular function study: Secondary | ICD-10-CM | POA: Diagnosis not present

## 2017-06-11 DIAGNOSIS — Z9989 Dependence on other enabling machines and devices: Secondary | ICD-10-CM

## 2017-06-11 DIAGNOSIS — R946 Abnormal results of thyroid function studies: Secondary | ICD-10-CM | POA: Diagnosis present

## 2017-06-11 DIAGNOSIS — I25110 Atherosclerotic heart disease of native coronary artery with unstable angina pectoris: Secondary | ICD-10-CM | POA: Diagnosis present

## 2017-06-11 DIAGNOSIS — I2510 Atherosclerotic heart disease of native coronary artery without angina pectoris: Secondary | ICD-10-CM | POA: Diagnosis not present

## 2017-06-11 DIAGNOSIS — Z955 Presence of coronary angioplasty implant and graft: Secondary | ICD-10-CM

## 2017-06-11 DIAGNOSIS — Z85038 Personal history of other malignant neoplasm of large intestine: Secondary | ICD-10-CM

## 2017-06-11 DIAGNOSIS — Y9368 Activity, volleyball (beach) (court): Secondary | ICD-10-CM

## 2017-06-11 DIAGNOSIS — Y712 Prosthetic and other implants, materials and accessory cardiovascular devices associated with adverse incidents: Secondary | ICD-10-CM | POA: Diagnosis present

## 2017-06-11 DIAGNOSIS — Z8679 Personal history of other diseases of the circulatory system: Secondary | ICD-10-CM

## 2017-06-11 DIAGNOSIS — I10 Essential (primary) hypertension: Secondary | ICD-10-CM | POA: Diagnosis present

## 2017-06-11 DIAGNOSIS — Z7902 Long term (current) use of antithrombotics/antiplatelets: Secondary | ICD-10-CM

## 2017-06-11 DIAGNOSIS — W07XXXA Fall from chair, initial encounter: Secondary | ICD-10-CM | POA: Diagnosis present

## 2017-06-11 DIAGNOSIS — R0602 Shortness of breath: Secondary | ICD-10-CM | POA: Diagnosis present

## 2017-06-11 DIAGNOSIS — I208 Other forms of angina pectoris: Secondary | ICD-10-CM | POA: Diagnosis not present

## 2017-06-11 DIAGNOSIS — S41112A Laceration without foreign body of left upper arm, initial encounter: Secondary | ICD-10-CM | POA: Diagnosis present

## 2017-06-11 DIAGNOSIS — Z8619 Personal history of other infectious and parasitic diseases: Secondary | ICD-10-CM | POA: Diagnosis not present

## 2017-06-11 DIAGNOSIS — I214 Non-ST elevation (NSTEMI) myocardial infarction: Secondary | ICD-10-CM

## 2017-06-11 DIAGNOSIS — E785 Hyperlipidemia, unspecified: Secondary | ICD-10-CM | POA: Diagnosis present

## 2017-06-11 DIAGNOSIS — Z79899 Other long term (current) drug therapy: Secondary | ICD-10-CM

## 2017-06-11 DIAGNOSIS — R748 Abnormal levels of other serum enzymes: Secondary | ICD-10-CM | POA: Diagnosis not present

## 2017-06-11 DIAGNOSIS — R7989 Other specified abnormal findings of blood chemistry: Secondary | ICD-10-CM

## 2017-06-11 DIAGNOSIS — G4733 Obstructive sleep apnea (adult) (pediatric): Secondary | ICD-10-CM | POA: Diagnosis present

## 2017-06-11 DIAGNOSIS — T82855A Stenosis of coronary artery stent, initial encounter: Principal | ICD-10-CM | POA: Diagnosis present

## 2017-06-11 DIAGNOSIS — Z9861 Coronary angioplasty status: Secondary | ICD-10-CM | POA: Diagnosis not present

## 2017-06-11 DIAGNOSIS — R778 Other specified abnormalities of plasma proteins: Secondary | ICD-10-CM | POA: Diagnosis present

## 2017-06-11 HISTORY — DX: Non-ST elevation (NSTEMI) myocardial infarction: I21.4

## 2017-06-11 LAB — CBC
HCT: 38.4 % — ABNORMAL LOW (ref 39.0–52.0)
Hemoglobin: 13.1 g/dL (ref 13.0–17.0)
MCH: 27.9 pg (ref 26.0–34.0)
MCHC: 34.1 g/dL (ref 30.0–36.0)
MCV: 81.7 fL (ref 78.0–100.0)
PLATELETS: 97 10*3/uL — AB (ref 150–400)
RBC: 4.7 MIL/uL (ref 4.22–5.81)
RDW: 15 % (ref 11.5–15.5)
WBC: 5.8 10*3/uL (ref 4.0–10.5)

## 2017-06-11 LAB — COMPREHENSIVE METABOLIC PANEL
ALT: 12 U/L — AB (ref 17–63)
AST: 20 U/L (ref 15–41)
Albumin: 3.5 g/dL (ref 3.5–5.0)
Alkaline Phosphatase: 103 U/L (ref 38–126)
Anion gap: 6 (ref 5–15)
BUN: 17 mg/dL (ref 6–20)
CHLORIDE: 105 mmol/L (ref 101–111)
CO2: 25 mmol/L (ref 22–32)
CREATININE: 1.16 mg/dL (ref 0.61–1.24)
Calcium: 9.4 mg/dL (ref 8.9–10.3)
GFR calc Af Amer: >60 mL/min (ref >60)
GFR calc non Af Amer: 55 mL/min — ABNORMAL LOW (ref >60)
Glucose, Bld: 80 mg/dL (ref 65–99)
Potassium: 3.9 mmol/L (ref 3.5–5.1)
Sodium: 136 mmol/L (ref 135–145)
Total Bilirubin: 0.6 mg/dL (ref 0.3–1.2)
Total Protein: 7.3 g/dL (ref 6.5–8.1)

## 2017-06-11 LAB — TROPONIN I
TROPONIN I: 0.04 ng/mL — AB (ref <0.03)
Troponin I: <0.03 ng/mL (ref <0.03)

## 2017-06-11 MED ORDER — ASPIRIN 81 MG PO CHEW
324.0000 mg | CHEWABLE_TABLET | Freq: Once | ORAL | Status: AC
  Administered 2017-06-11: 324 mg via ORAL
  Filled 2017-06-11: qty 4

## 2017-06-11 MED ORDER — HEPARIN BOLUS VIA INFUSION
3500.0000 [IU] | Freq: Once | INTRAVENOUS | Status: AC
  Administered 2017-06-11: 3500 [IU] via INTRAVENOUS

## 2017-06-11 MED ORDER — HEPARIN (PORCINE) IN NACL 100-0.45 UNIT/ML-% IJ SOLN
950.0000 [IU]/h | INTRAMUSCULAR | Status: DC
  Administered 2017-06-11: 950 [IU]/h via INTRAVENOUS
  Filled 2017-06-11: qty 250

## 2017-06-11 NOTE — ED Triage Notes (Signed)
Patient states the he had some palpitations after his afternoon walk with tightness up into his throat. The patient then played chair volley ball and hurt his left elbow as well

## 2017-06-11 NOTE — ED Provider Notes (Addendum)
[facility] EMERGENCY DEPARTMENT Provider Note  CSN: [id number] Arrival date & time: 06/11/17 1640  Chief Complaint(s) Palpitations  HPI Anthony Petty is a 84 y.o. male CAD s/p stent placed 10 yrs ago.  The history is provided by the patient.   CC: neck pain  Onset/Duration: 6 hrs ago Timing: intermittent, for approx 1 hr. Then returned 2 hrs later Location: bilateral neck. nonradiating Quality: aching Severity: mild to moderate Modifying Factors:  Improved by: rest  Worsened by: exertion Associated Signs/Symptoms:  Pertinent (+): mild SOB, palpitations,  Pertinent (-): HA, CP, N/V. diaphoresis Context: was walking the first time he felt the pain. Later was playing chair volleyball.    Additionally, states that he fell during the volleyball game and has a left arm skin tear. No head trauma.  Past Medical History Past Medical History:  Diagnosis Date  . Colon cancer ([place]) 1987  . Coronary artery disease   . History of bacterial endocarditis 1980, 1990s  . Hyperlipidemia   . OSA on CPAP    Patient Active Problem List   Diagnosis Date Noted  . NSTEMI (non-ST elevated myocardial infarction) ([place]) 06/11/2017  . Left carotid bruit 07/19/2016  . OSA on CPAP 09/15/2015  . Raynaud's disease 06/10/2013  . Chest pain at rest 06/02/2013  . Bradycardia 05/16/2013  . Palpitations 05/16/2013  . Hyperlipidemia with target LDL less than 70 04/27/2013  . History of colon cancer 06/27/2007  . CAD S/P percutaneous coronary angioplasty 06/27/2007   Home Medication(s) Prior to Admission medications   Medication Sig Start Date End Date Taking? Authorizing Provider  amLODipine (NORVASC) 5 MG tablet TAKE 1/2 TABLET DAILY 10/09/16   Lorretta Harp, MD  aspirin 81 MG tablet Take 81 mg by mouth daily.    [provider]  Cholecalciferol (VITAMIN D3) 400 UNITS CAPS Take 1 capsule by mouth daily.    [provider]  clopidogrel (PLAVIX) 75 MG tablet Take  1 tablet (75 mg total) by mouth daily. 11/13/16   Troy Sine, MD  co-enzyme Q-10 30 MG capsule Take 30 mg by mouth daily.    [provider]  fish oil-omega-3 fatty acids 1000 MG capsule Take 2 g by mouth daily.    [provider]  Glucosamine-Chondroit-Vit C-Mn (GLUCOSAMINE 1500 COMPLEX PO) Take 1 capsule by mouth daily.    [provider]  isosorbide mononitrate (IMDUR) 30 MG 24 hr tablet TAKE ONE (1) TABLET EACH DAY 06/05/16   Troy Sine, MD  levothyroxine (SYNTHROID, LEVOTHROID) 25 MCG tablet Take 25 mcg by mouth daily before breakfast.    [provider]  Misc Natural Products (TURMERIC CURCUMIN) CAPS Take 1 capsule by mouth daily.    [provider]  nitroGLYCERIN (NITROSTAT) 0.4 MG SL tablet Place 1 tablet (0.4 mg total) under the tongue every 5 (five) minutes as needed for chest pain. 04/14/15   Troy Sine, MD  NON FORMULARY CPAP therapy    [provider]  saw palmetto 160 MG capsule Take 160 mg by mouth daily.    [provider]  tamsulosin (FLOMAX) 0.4 MG CAPS capsule Take 1 capsule (0.4 mg total) by mouth daily. 04/16/13   Pixie Casino, MD  VYTORIN 10-20 MG tablet TAKE ONE (1) TABLET EACH DAY AT BEDTIME 09/16/15   Troy Sine, MD  Past Surgical History Past Surgical History:  Procedure Laterality Date  . CARDIAC CATHETERIZATION  08/07/2005   40-50% narrowing in LAD, 30-40% scattered irregularity of diagonal, subtotal-total prox Cfx occlusion w/collaterals of marginal 1, prox 30-40% RCA narrowing, 40-50% distal RCA stenosis (Dr. Corky Downs)  . CORONARY ANGIOPLASTY WITH STENT PLACEMENT  08/15/2005   stent to RCA and prox Cfx with 3x4mm DES to AV groove Cfx and 2.5x13 Cypher DES to marginal (Dr. Corky Downs)  . KNEE SURGERY  2004   right knee  . NM MYOCAR PERF WALL MOTION  02/2012    lexiscan - normal perfusion, EF 66%, low risk   . SUBTOTAL COLECTOMY     in [place] r/t adenocarcinoma of colon   . TRANSTHORACIC ECHOCARDIOGRAM  12/2010   EF=>55%, mild conc LVH; borderline LA enlargement; calcification of anterior MV leaflets, mild MR; mild TR; RVSP 30-65mmHg; mild calcification of AV leaflets and mild-mod AV regurg; mild pulm regurg; aortic root sclerosis/calcification   Family History History reviewed. No pertinent family history.  Social History Social History   Tobacco Use  . Smoking status: Never Smoker  . Smokeless tobacco: Never Used  Substance Use Topics  . Alcohol use: Yes    Alcohol/week: 0.0 oz    Comment: ocassional glass of wine.  . Drug use: Not on file   Allergies Patient has no known allergies.  Review of Systems Review of Systems All other systems are reviewed and are negative for acute change except as noted in the HPI  Physical Exam Vital Signs  I have reviewed the triage vital signs BP (!) 159/59   Pulse (!) 54   Temp (!) 97.5 F (36.4 C) (Oral)   Resp 15   Ht 5\' 9"  (1.753 m)   Wt 70.8 kg (156 lb)   SpO2 100%   BMI 23.04 kg/m   Physical Exam  Constitutional: He is oriented to person, place, and time. He appears well-developed and well-nourished. No distress.  HENT:  Head: Normocephalic and atraumatic.  Right Ear: External ear normal.  Left Ear: External ear normal.  Nose: Nose normal.  Mouth/Throat: Oropharynx is clear and moist.  Eyes: Conjunctivae and EOM are normal. Pupils are equal, round, and reactive to light. Right eye exhibits no discharge. Left eye exhibits no discharge. No scleral icterus.  Neck: Normal range of motion. Neck supple.  Cardiovascular: Normal rate, regular rhythm and normal heart sounds. Exam reveals no gallop and no friction rub.  No murmur heard. Pulses:      Radial pulses are 2+ on the right side, and 2+ on the left side.       Dorsalis pedis pulses are 2+ on the right side, and 2+ on the left  side.  Pulmonary/Chest: Effort normal and breath sounds normal. No stridor. No respiratory distress. He has no rales.  Abdominal: Soft. He exhibits no distension. There is no tenderness.  Musculoskeletal: He exhibits no edema or tenderness.       Left elbow: He exhibits normal range of motion and no deformity. No tenderness found.       Cervical back: He exhibits no bony tenderness.       Thoracic back: He exhibits no bony tenderness.       Lumbar back: He exhibits no bony tenderness.       Left upper arm: He exhibits no tenderness and no bony tenderness.       Left forearm: He exhibits no tenderness and no bony tenderness.  Clavicle stable. Chest stable  to AP/Lat compression. Pelvis stable to Lat compression. No obvious extremity deformity. No chest or abdominal wall contusion.  Neurological: He is alert and oriented to person, place, and time. GCS eye subscore is 4. GCS verbal subscore is 5. GCS motor subscore is 6.  Moving all extremities   Skin: Skin is warm and dry. No rash noted. He is not diaphoretic. No erythema.     Psychiatric: He has a normal mood and affect.  Vitals reviewed.   ED Results and Treatments Labs (all labs ordered are listed, but only abnormal results are displayed) Labs Reviewed  CBC - Abnormal; Notable for the following components:      Result Value   HCT 38.4 (*)    Platelets 97 (*)    All other components within normal limits  COMPREHENSIVE METABOLIC PANEL - Abnormal; Notable for the following components:   ALT 12 (*)    GFR calc non Af Amer 55 (*)    All other components within normal limits  TROPONIN I - Abnormal; Notable for the following components:   Troponin I 0.04 (*)    All other components within normal limits  TROPONIN I  HEPARIN LEVEL (UNFRACTIONATED)  CBC                                                                                                                         EKG  EKG Interpretation  Date/Time:  Monday June 11 2017 16:43:48 EST Ventricular Rate:  55 PR Interval:  156 QRS Duration: 92 QT Interval:  420 QTC Calculation: 401 R Axis:   32 Text Interpretation:  Sinus bradycardia with sinus arrhythmia Otherwise normal ECG Otherwise no significant change Confirmed by Addison Lank [id number]) on 06/11/2017 5:02:52 PM      Radiology Dg Chest 2 View  Result Date: 06/11/2017 CLINICAL DATA:  Shortness of breath. EXAM: CHEST  2 VIEW COMPARISON:  Chest x-ray dated June 21, 2016. FINDINGS: The cardiomediastinal silhouette is normal in size. Normal pulmonary vascularity. No focal consolidation, pleural effusion, or pneumothorax. No acute osseous abnormality. IMPRESSION: No active cardiopulmonary disease. Electronically Signed   By: Titus Dubin M.D.   On: 06/11/2017 18:29   Pertinent labs & imaging results that were available during my care of the patient were reviewed by me and considered in my medical decision making (see chart for details).  Medications Ordered in ED Medications  heparin ADULT infusion 100 units/mL (25000 units/234mL sodium chloride 0.45%) (950 Units/hr Intravenous New Bag/Given 06/11/17 2139)  aspirin chewable tablet 324 mg (not administered)  heparin bolus via infusion 3,500 Units (3,500 Units Intravenous Bolus from Bag 06/11/17 2139)  Procedures Procedures CRITICAL CARE Performed by: Grayce Sessions Letonia Stead Total critical care time: 40 minutes Critical care time was exclusive of separately billable procedures and treating other patients. Critical care was necessary to treat or prevent imminent or life-threatening deterioration. Critical care was time spent personally by me on the following activities: development of treatment plan with patient and/or surrogate as well as nursing, discussions with consultants, evaluation of patient's response to treatment,  examination of patient, obtaining history from patient or surrogate, ordering and performing treatments and interventions, ordering and review of laboratory studies, ordering and review of radiographic studies, pulse oximetry and re-evaluation of patient's condition.   (including critical care time)  Medical Decision Making / ED Course I have reviewed the nursing notes for this encounter and the patient's prior records (if available in EHR or on provided paperwork).    EKG without acute ischemic changes or evidence of pericarditis.  Initial troponin negative.  Discussed case with Dr. Teena Dunk, on-call cardiologist who agreed with obtaining a delta troponin.  If negative patient will follow up with cardiology.  If positive patient will be admitted.  Presentation not classic for aortic dissection or esophageal perforation.  Low suspicion for pulmonary embolism.  Chest x-ray without evidence suggestive of pneumonia, pneumothorax, pneumomediastinum.  No abnormal contour of the mediastinum to suggest dissection. No evidence of acute injuries.  Second troponin positive at 0.04.  Patient started on heparin.  Will admit to cardiology.   Final Clinical Impression(s) / ED Diagnoses Final diagnoses:  SOB (shortness of breath)  NSTEMI (non-ST elevated myocardial infarction) [facility])      This chart was dictated using voice recognition software.  Despite best efforts to proofread,  errors can occur which can change the documentation meaning.   Fatima Blank, MD 06/11/17 2256    Fatima Blank, MD 06/21/17 1226

## 2017-06-11 NOTE — ED Notes (Addendum)
Paged cardiologist, Dr. Teena Dunk.

## 2017-06-11 NOTE — Progress Notes (Addendum)
ANTICOAGULATION CONSULT NOTE - Initial Consult  Pharmacy Consult for heparin Indication: chest pain/ACS  No Known Allergies  Patient Measurements: Height: 5\' 9"  (175.3 cm) Weight: 156 lb (70.8 kg) IBW/kg (Calculated) : 70.7  Vital Signs: Temp: 97.5 F (36.4 C) (01/14 1643) Temp Source: Oral (01/14 1643) BP: 151/78 (01/14 2012) Pulse Rate: 62 (01/14 2012)  Labs: Recent Labs    06/11/17 1801 06/11/17 2008  HGB 13.1  --   HCT 38.4*  --   PLT 97*  --   CREATININE 1.16  --   TROPONINI <0.03 0.04*    Estimated Creatinine Clearance: 44.9 mL/min (by C-G formula based on SCr of 1.16 mg/dL).   Medical History: Past Medical History:  Diagnosis Date  . Colon cancer ([place]) 1987  . Coronary artery disease   . History of bacterial endocarditis 1980, 1990s  . Hyperlipidemia   . OSA on CPAP      Assessment: 89 YOM at [facility] with chest pain. Not on anticoagualation PTA per last medication history list. Troponins mildly elevated.   Hgb normal, platelets low at 97- were 150s a year ago, but no labs on file since then. No bleeding noted.   Goal of Therapy:  Heparin level 0.3-0.7 units/ml Monitor platelets by anticoagulation protocol: Yes   Plan:  Heparin bolus 3500 units IV x1, then start infusion at 950units/hr Heparin level in 8h Daily heparin level and CBC  Lira Stephen D. Cylan Borum, PharmD, [place] Clinical Pharmacist [phone] 06/11/2017 9:22 PM

## 2017-06-11 NOTE — ED Notes (Signed)
Paged cardiologist Dr. Marlou Porch

## 2017-06-12 ENCOUNTER — Other Ambulatory Visit: Payer: Self-pay

## 2017-06-12 ENCOUNTER — Inpatient Hospital Stay (HOSPITAL_COMMUNITY): Payer: Medicare Other

## 2017-06-12 DIAGNOSIS — R748 Abnormal levels of other serum enzymes: Secondary | ICD-10-CM

## 2017-06-12 DIAGNOSIS — I2510 Atherosclerotic heart disease of native coronary artery without angina pectoris: Secondary | ICD-10-CM

## 2017-06-12 DIAGNOSIS — I208 Other forms of angina pectoris: Secondary | ICD-10-CM

## 2017-06-12 DIAGNOSIS — Z9861 Coronary angioplasty status: Secondary | ICD-10-CM

## 2017-06-12 DIAGNOSIS — R778 Other specified abnormalities of plasma proteins: Secondary | ICD-10-CM | POA: Diagnosis present

## 2017-06-12 DIAGNOSIS — R7989 Other specified abnormal findings of blood chemistry: Secondary | ICD-10-CM

## 2017-06-12 DIAGNOSIS — R002 Palpitations: Secondary | ICD-10-CM

## 2017-06-12 DIAGNOSIS — R9439 Abnormal result of other cardiovascular function study: Secondary | ICD-10-CM | POA: Clinically undetermined

## 2017-06-12 LAB — BASIC METABOLIC PANEL
Anion gap: 10 (ref 5–15)
Anion gap: 13 (ref 5–15)
BUN: 14 mg/dL (ref 6–20)
BUN: 14 mg/dL (ref 6–20)
CALCIUM: 9.2 mg/dL (ref 8.9–10.3)
CALCIUM: 9.6 mg/dL (ref 8.9–10.3)
CHLORIDE: 107 mmol/L (ref 101–111)
CO2: 22 mmol/L (ref 22–32)
CO2: 20 mmol/L — AB (ref 22–32)
CREATININE: 0.97 mg/dL (ref 0.61–1.24)
CREATININE: 1.27 mg/dL — AB (ref 0.61–1.24)
Chloride: 105 mmol/L (ref 101–111)
GFR calc non Af Amer: >60 mL/min (ref >60)
GFR calc non Af Amer: 49 mL/min — ABNORMAL LOW (ref >60)
GFR, EST AFRICAN AMERICAN: 57 mL/min — AB (ref >60)
Glucose, Bld: 88 mg/dL (ref 65–99)
Glucose, Bld: 105 mg/dL — ABNORMAL HIGH (ref 65–99)
Potassium: 4.1 mmol/L (ref 3.5–5.1)
Potassium: 3.6 mmol/L (ref 3.5–5.1)
SODIUM: 137 mmol/L (ref 135–145)
SODIUM: 140 mmol/L (ref 135–145)

## 2017-06-12 LAB — NM MYOCAR MULTI W/SPECT W/WALL MOTION / EF
CSEPEW: 1 METS
CSEPHR: 55 %
CSEPPHR: 74 {beats}/min
Exercise duration (min): 0 min
Exercise duration (sec): 0 s
MPHR: 133 {beats}/min
Rest HR: 57 {beats}/min

## 2017-06-12 LAB — TROPONIN I
TROPONIN I: 0.05 ng/mL — AB (ref <0.03)
Troponin I: 0.04 ng/mL (ref <0.03)
Troponin I: 0.03 ng/mL (ref <0.03)

## 2017-06-12 LAB — T4, FREE: FREE T4: 0.97 ng/dL (ref 0.61–1.12)

## 2017-06-12 LAB — HEPARIN LEVEL (UNFRACTIONATED): HEPARIN UNFRACTIONATED: 0.42 [IU]/mL (ref 0.30–0.70)

## 2017-06-12 LAB — TSH: TSH: 5.315 u[IU]/mL — ABNORMAL HIGH (ref 0.350–4.500)

## 2017-06-12 MED ORDER — ACETAMINOPHEN 325 MG PO TABS: 650.0000 mg | ORAL_TABLET | ORAL | Status: DC | PRN

## 2017-06-12 MED ORDER — TECHNETIUM TC 99M TETROFOSMIN IV KIT
10.0000 | PACK | Freq: Once | INTRAVENOUS | Status: AC | PRN
  Administered 2017-06-12: 10 via INTRAVENOUS

## 2017-06-12 MED ORDER — ASPIRIN 81 MG PO TABS: 81.0000 mg | ORAL_TABLET | Freq: Every day | ORAL | Status: DC

## 2017-06-12 MED ORDER — TRAZODONE HCL 50 MG PO TABS
50.0000 mg | ORAL_TABLET | Freq: Every day | ORAL | Status: DC
  Administered 2017-06-12 – 2017-06-13 (×2): 50 mg via ORAL
  Filled 2017-06-12 (×2): qty 1

## 2017-06-12 MED ORDER — ONDANSETRON HCL 4 MG/2ML IJ SOLN: 4.0000 mg | Freq: Four times a day (QID) | INTRAMUSCULAR | Status: DC | PRN

## 2017-06-12 MED ORDER — SODIUM CHLORIDE 0.9 % IV SOLN: 250.0000 mL | INTRAVENOUS | Status: DC | PRN

## 2017-06-12 MED ORDER — EZETIMIBE-SIMVASTATIN 10-20 MG PO TABS
1.0000 | ORAL_TABLET | Freq: Every day | ORAL | Status: DC
  Administered 2017-06-12 – 2017-06-13 (×2): 1 via ORAL
  Filled 2017-06-12 (×3): qty 1

## 2017-06-12 MED ORDER — ISOSORBIDE MONONITRATE ER 30 MG PO TB24
15.0000 mg | ORAL_TABLET | Freq: Every day | ORAL | Status: DC
  Administered 2017-06-12 – 2017-06-13 (×2): 15 mg via ORAL
  Filled 2017-06-12 (×3): qty 1

## 2017-06-12 MED ORDER — COENZYME Q10 30 MG PO CAPS: 30.0000 mg | ORAL_CAPSULE | Freq: Every day | ORAL | Status: DC

## 2017-06-12 MED ORDER — ASPIRIN 81 MG PO CHEW
81.0000 mg | CHEWABLE_TABLET | ORAL | Status: AC
  Administered 2017-06-13: 81 mg via ORAL
  Filled 2017-06-12: qty 1

## 2017-06-12 MED ORDER — CLOPIDOGREL BISULFATE 75 MG PO TABS
75.0000 mg | ORAL_TABLET | ORAL | Status: AC
  Administered 2017-06-13: 75 mg via ORAL
  Filled 2017-06-12: qty 1

## 2017-06-12 MED ORDER — TECHNETIUM TC 99M TETROFOSMIN IV KIT
30.0000 | PACK | Freq: Once | INTRAVENOUS | Status: AC | PRN
  Administered 2017-06-12: 30 via INTRAVENOUS

## 2017-06-12 MED ORDER — TAMSULOSIN HCL 0.4 MG PO CAPS
0.4000 mg | ORAL_CAPSULE | Freq: Every day | ORAL | Status: DC
  Administered 2017-06-12 – 2017-06-13 (×2): 0.4 mg via ORAL
  Filled 2017-06-12 (×3): qty 1

## 2017-06-12 MED ORDER — ASPIRIN 300 MG RE SUPP
300.0000 mg | RECTAL | Status: AC
  Filled 2017-06-12: qty 1

## 2017-06-12 MED ORDER — NITROGLYCERIN 0.4 MG SL SUBL: 0.4000 mg | SUBLINGUAL_TABLET | SUBLINGUAL | Status: DC | PRN

## 2017-06-12 MED ORDER — SODIUM CHLORIDE 0.9 % WEIGHT BASED INFUSION
3.0000 mL/kg/h | INTRAVENOUS | Status: DC
  Administered 2017-06-13: 3 mL/kg/h via INTRAVENOUS

## 2017-06-12 MED ORDER — CLOPIDOGREL BISULFATE 75 MG PO TABS
75.0000 mg | ORAL_TABLET | Freq: Every day | ORAL | Status: DC
  Administered 2017-06-12: 75 mg via ORAL
  Filled 2017-06-12 (×2): qty 1

## 2017-06-12 MED ORDER — SODIUM CHLORIDE 0.9 % WEIGHT BASED INFUSION
1.0000 mL/kg/h | INTRAVENOUS | Status: DC
  Administered 2017-06-13: 1 mL/kg/h via INTRAVENOUS

## 2017-06-12 MED ORDER — ASPIRIN 300 MG RE SUPP: 300.0000 mg | RECTAL | Status: DC

## 2017-06-12 MED ORDER — AMLODIPINE BESYLATE 5 MG PO TABS
2.5000 mg | ORAL_TABLET | Freq: Every day | ORAL | Status: DC
  Administered 2017-06-12 – 2017-06-13 (×2): 2.5 mg via ORAL
  Filled 2017-06-12 (×3): qty 1

## 2017-06-12 MED ORDER — SODIUM CHLORIDE 0.9% FLUSH: 3.0000 mL | INTRAVENOUS | Status: DC | PRN

## 2017-06-12 MED ORDER — LEVOTHYROXINE SODIUM 25 MCG PO TABS
25.0000 ug | ORAL_TABLET | Freq: Every day | ORAL | Status: DC
  Administered 2017-06-12 – 2017-06-14 (×3): 25 ug via ORAL
  Filled 2017-06-12 (×3): qty 1

## 2017-06-12 MED ORDER — ONDANSETRON HCL 4 MG/2ML IJ SOLN
4.0000 mg | Freq: Four times a day (QID) | INTRAMUSCULAR | Status: DC | PRN
Start: 2017-06-12 — End: 2017-06-12

## 2017-06-12 MED ORDER — ASPIRIN EC 81 MG PO TBEC: 81.0000 mg | DELAYED_RELEASE_TABLET | Freq: Every day | ORAL | Status: DC

## 2017-06-12 MED ORDER — REGADENOSON 0.4 MG/5ML IV SOLN
INTRAVENOUS | Status: AC
  Administered 2017-06-12: 0.4 mg via INTRAVENOUS
  Filled 2017-06-12: qty 5

## 2017-06-12 MED ORDER — ASPIRIN 81 MG PO CHEW
324.0000 mg | CHEWABLE_TABLET | ORAL | Status: AC
  Administered 2017-06-12: 324 mg via ORAL
  Filled 2017-06-12: qty 4

## 2017-06-12 MED ORDER — REGADENOSON 0.4 MG/5ML IV SOLN
0.4000 mg | Freq: Once | INTRAVENOUS | Status: AC
  Administered 2017-06-12: 0.4 mg via INTRAVENOUS
  Filled 2017-06-12: qty 5

## 2017-06-12 MED ORDER — SODIUM CHLORIDE 0.9% FLUSH
3.0000 mL | Freq: Two times a day (BID) | INTRAVENOUS | Status: DC
  Administered 2017-06-12 – 2017-06-13 (×2): 3 mL via INTRAVENOUS

## 2017-06-12 MED ORDER — ASPIRIN 81 MG PO CHEW: 324.0000 mg | CHEWABLE_TABLET | ORAL | Status: DC

## 2017-06-12 NOTE — Progress Notes (Signed)
ANTICOAGULATION CONSULT NOTE  Pharmacy Consult for heparin Indication: chest pain/ACS  No Known Allergies  Patient Measurements: Height: 5\' 9"  (175.3 cm) Weight: 152 lb 4.8 oz (69.1 kg) IBW/kg (Calculated) : 70.7  Vital Signs: Temp: 98.2 F (36.8 C) (01/15 0812) Temp Source: Oral (01/15 0812) BP: 148/53 (01/15 0812) Pulse Rate: 55 (01/15 0812)  Labs: Recent Labs    06/11/17 1801 06/11/17 2008 06/12/17 0637 06/12/17 0826  HGB 13.1  --   --   --   HCT 38.4*  --   --   --   PLT 97*  --   --   --   HEPARINUNFRC  --   --   --  0.42  CREATININE 1.16  --  0.97  --   TROPONINI <0.03 0.04* 0.05*  --     Estimated Creatinine Clearance: 52.4 mL/min (by C-G formula based on SCr of 0.97 mg/dL).   Assessment: 68 YOM at [facility] with chest pain. Not on anticoagualation PTA per last medication history list. Troponins mildly elevated.   Heparin level is therapeutic at 0.42 on 950 units/hr. No bleeding noted, CBC pending for today.   Goal of Therapy:  Heparin level 0.3-0.7 units/ml Monitor platelets by anticoagulation protocol: Yes   Plan:  Heparin infusion at 950units/hr Confirmatory heparin level at 14:00 Daily heparin level and CBC Monitor for s/sx of bleeding   Renold Genta, PharmD, BCPS Clinical Pharmacist Phone for today - [place] - [phone] 06/12/2017 9:41 AM

## 2017-06-12 NOTE — Progress Notes (Signed)
      Anthony Petty presented for a nuclear stress test today.  No immediate complications.  Stress imaging is pending at this time.  Preliminary EKG findings may be listed in the chart, but the stress test result will not be finalized until perfusion imaging is complete.  One day study, [facility] Radiology to read.  Kathyrn Drown, PA-C 06/12/2017, 12:41 PM

## 2017-06-12 NOTE — H&P (View-Only) (Signed)
Progress Note  Patient Name: Anthony Petty Date of Encounter: 06/12/2017  Primary Cardiologist: [name]  Subjective   No chest pain this morning.   Inpatient Medications    Scheduled Meds: . amLODipine  2.5 mg Oral Daily  . [START ON 06/13/2017] aspirin EC  81 mg Oral Daily  . clopidogrel  75 mg Oral Daily  . ezetimibe-simvastatin  1 tablet Oral q1800  . isosorbide mononitrate  15 mg Oral Daily  . levothyroxine  25 mcg Oral QAC breakfast  . tamsulosin  0.4 mg Oral Daily   Continuous Infusions: . heparin 950 Units/hr (06/11/17 2139)   PRN Meds: acetaminophen, nitroGLYCERIN, ondansetron (ZOFRAN) IV   Vital Signs    Vitals:   06/12/17 0256 06/12/17 0300 06/12/17 0506 06/12/17 0812  BP: (!) 150/70  (!) 155/90 (!) 148/53  Pulse: (!) 57  (!) 52 (!) 55  Resp: 20  20 18   Temp: 98.6 F (37 C)  98.4 F (36.9 C) 98.2 F (36.8 C)  TempSrc: Oral  Oral Oral  SpO2: 94%  96% 95%  Weight:  152 lb 4.8 oz (69.1 kg)    Height:  5\' 9"  (1.753 m)      Intake/Output Summary (Last 24 hours) at 06/12/2017 1039 Last data filed at 06/12/2017 0945 Gross per 24 hour  Intake 250.83 ml  Output 2100 ml  Net -1849.17 ml   Filed Weights   06/11/17 1642 06/12/17 0300  Weight: 156 lb (70.8 kg) 152 lb 4.8 oz (69.1 kg)    Telemetry    SB - Personally Reviewed  ECG    SB - Personally Reviewed  Physical Exam   General: Well developed, well nourished, older W male appearing in no acute distress. Head: Normocephalic, atraumatic.  Neck: Supple, no JVD. Lungs:  Resp regular and unlabored, CTA. Heart: RRR, S1, S2, no S3, S4, or murmur; no rub. Abdomen: Soft, non-tender, non-distended with normoactive bowel sounds.  Extremities: No clubbing, cyanosis, edema. Distal pedal pulses are 2+ bilaterally. Neuro: Alert and oriented X 3. Moves all extremities spontaneously. Psych: Normal affect.  Labs    Chemistry Recent Labs  Lab 06/11/17 1801 06/12/17 0637  NA 136 137  K 3.9 4.1  CL  105 105  CO2 25 22  GLUCOSE 80 88  BUN 17 14  CREATININE 1.16 0.97  CALCIUM 9.4 9.2  PROT 7.3  --   ALBUMIN 3.5  --   AST 20  --   ALT 12*  --   ALKPHOS 103  --   BILITOT 0.6  --   GFRNONAA 55* >60  GFRAA >60 >60  ANIONGAP 6 10     Hematology Recent Labs  Lab 06/11/17 1801  WBC 5.8  RBC 4.70  HGB 13.1  HCT 38.4*  MCV 81.7  MCH 27.9  MCHC 34.1  RDW 15.0  PLT 97*    Cardiac Enzymes Recent Labs  Lab 06/11/17 1801 06/11/17 2008 06/12/17 0637  TROPONINI <0.03 0.04* 0.05*   No results for input(s): TROPIPOC in the last 168 hours.   BNPNo results for input(s): BNP, PROBNP in the last 168 hours.   DDimer No results for input(s): DDIMER in the last 168 hours.    Radiology    Dg Chest 2 View  Result Date: 06/11/2017 CLINICAL DATA:  Shortness of breath. EXAM: CHEST  2 VIEW COMPARISON:  Chest x-ray dated June 21, 2016. FINDINGS: The cardiomediastinal silhouette is normal in size. Normal pulmonary vascularity. No focal consolidation, pleural effusion, or pneumothorax. No acute  osseous abnormality. IMPRESSION: No active cardiopulmonary disease. Electronically Signed   By: Titus Dubin M.D.   On: 06/11/2017 18:29    Cardiac Studies   N/a  Patient Profile     84 y.o. male with PMH of CAD s/p stenting to RCA ('07), HL, bacterial endocarditis and OSA on cpap who presented with palpitations.   Assessment & Plan    Principal Problem:   Atypical angina (HCC) Active Problems:   CAD S/P percutaneous coronary angioplasty   Abnormal nuclear stress test --intermediate risk with inferior ischemia   Hyperlipidemia with target LDL less than 70   Heart palpitations  1. Palpitations: Reports he developed palpitations while walking yesterday morning and again after playing chair volleyball. Developed some pressures in the sides of his neck after the palpitations. No chest pain or dyspnea. Very active 84 yo. Nothing on telemetry overnight. Likely plan for outpatient  monitor at the time of discharge.   2. Elevated troponin: 0.05, no chest pain. Could be in the setting of tachy rhythm. Discussed with MD and will plan for stress test today given low flat trend. Will stop IV heparin.   3. CAD s/p stent of RCA ('07) - Atypical Angina: On medical therapy with ASA, statin, plavix. No BB given bradycardia  4. Elevated TSH: No hx of the same. Will check free T4  5. HL: on statin  Signed, Reino Bellis, NP  06/12/2017, 10:39 AM  Pager # [phone]   For questions or updates, please contact [place] Please consult [email or web address] for contact info under Cardiology/STEMI.    I have seen, examined and evaluated the patient this morning on rounds along with Reino Bellis, NP.  I also then saw him in follow-up after his stress test was completed to discuss the results.  Her.  After reviewing all the available data and chart, we discussed the patients laboratory, study & physical findings as well as symptoms in detail. I agree with her findings, examination as well as impression recommendations as per our discussion.    Based on initial presenting symptoms, there was concern that this could have been ACS related symptoms versus simply an arrhythmia.  Since he had minimal troponin elevation, and the story was not overly convincing, I felt the best for him to proceed with a noninvasive ischemic evaluation first given his relatively unusual presentation.  Low threshold for cardiac catheterization that there was an abnormal finding on the stress test.  We did stop IV heparin as he is not had any further symptoms.  He did have a few runs of PAT or PSVT on telemetry noted, but he did not necessarily feel them.  I am somewhat concerned that the symptoms may be related to an arrhythmia I agree with Ria Comment on this.  However ischemic evaluation was warranted.  At roughly 5: 30-5:45 PM, his Myoview results were finalized: Normal EF of 66%.  However there is a reversible  perfusion defect in the inferoseptal wall.  Cannot exclude artifact versus ischemia.  This is read as an intermediate risk study, and based on the fact that he has had stents in the RCA, I feel it prudent to proceed with an invasive evaluation to confirm or deny the presence of inferior ischemia.  I did return to the patient's room  in the evening to explain the results to him and discussed the plans for cardiac catheterization.  He will be made n.p.o. after midnight  Performing MD:  [facility] IC Cardiologist.  Procedure: LEFT HEART CATHETERIZATION WITH  NATIVE CORONARY ANGIOGRAPHY AND POSSIBLE PERCUTANEOUS CORONARY INTERVENTION  The procedure with Risks/Benefits/Alternatives and Indications was reviewed with the patient .  All questions were answered.    Risks / Complications include, but not limited to: Death, MI, CVA/TIA, VF/VT (with defibrillation), Bradycardia (need for temporary pacer placement), contrast induced nephropathy, bleeding / bruising / hematoma / pseudoaneurysm, vascular or coronary injury (with possible emergent CT or Vascular Surgery), adverse medication reactions, infection.  Additional risks involving the use of radiation with the possibility of radiation burns and cancer were explained in detail.  The patient voices understanding and agrees to proceed.  His wife actually called in during our discussion and she understands the findings and recommendations as well.    Glenetta Hew, M.D., M.S. Interventional Cardiologist   Pager # [phone] Phone # [phone] [address] [place], [place] [address]

## 2017-06-12 NOTE — H&P (Signed)
Cardiology Admission History and Physical:   Patient ID: Asa Baudoin; MRN: [id number]; DOB: 1927-10-20   Admission date: 06/11/2017  Primary Care Provider: Javier Glazier, MD Primary Cardiologist: Dr [name], Primary Electrophysiologist:         Chief Complaint:  ' Palpitations  Alexande Sheerin is a 84 y.o. male CAD s/p stent placed 10 yrs ago.     CC: neck pain  Onset/Duration: 6 hrs ago Timing: intermittent, for approx 1 hr. Then returned 2 hrs later Location: bilateral neck. nonradiating Quality: aching Severity: mild to moderate Modifying Factors:             Improved by: rest             Worsened by: exertion Associated Signs/Symptoms:             Pertinent (+): mild SOB, palpitations,             Pertinent (-): HA, CP, N/V. diaphoresis Context: was walking the first time he felt the pain. Later was playing chair volleyball.     Additionally, states that he fell during the volleyball game and has a left arm skin tear. No head trauma.    Patient Profile:   Seve Monette is a 84 y.o. male with a history of palpitations, neck pain   History of Present Illness:      Mr. Mcmillen has a history of bacterial endocarditis initially in 1980 and repeat in the 1990s. He has known CAD and cardiac catheterization in 2007 showed total occlusion of the circumflex vessel as well as high-grade AV groove circumflex and RCA disease for which he underwent stenting of his RCA and recanalization of a totally occluded circumflex and marginal system. Additional problems include paroxysmal atrial fibrillation.   An echo Doppler study in August 2012 showed mild LVH with normal systolic function and grade 1 diastolic dysfunction. He had mild aortic sclerosis with mild-to-moderate aortic insufficiency, mild MR, mild TR and PR. There is mild pulmonary hypertension with an estimated PA pressure 35 mm.  He has had issues of occasional palpitations, weakness and  fatigue. His beta blocker dose was reduced from Lopressor 12.5 twice a day to 12.5 mg daily and last year had a CardioNet monitor.  CardioNet monitor demonstrated a 4 beat episode of PSVT. He also complained of "tingling "in the left upper chest region with inspiration.  He underwent a follow-up nuclear study which continued to reveal normal perfusion without scar or ischemia.    January 2017, he underwent an echo Doppler study on 01/17/2016.  This showed an EF of 60-65%.  He had grade 1 diastolic dysfunction.  There was mild aortic insufficiency.  There was mitral annular calcification.  He was seen by Kerin Ransom in February 2018 with complaints of some mild chest pain and exertional fatigue.    He underwent a nuclear perfusion study on 08/25/2016 which was normal.  Ejection fraction was 61%.  There were no ECG changes.  There was no evidence for scar or ischemia.  He also underwent carotid duplex imaging which showed mild header genius plaque with very mild narrowing in the 1-39% range.  Antegrade flow in his vertebrals.  He denies any recent chest tightness.  He lives at [facility].  His new primary physician is now Dr. Antony Salmon at [facility] who has checked laboratory.  He presents for follow-up evaluation.   Past Medical History:  Diagnosis Date  . Colon cancer ([place]) 1987  . Coronary artery disease   .  History of bacterial endocarditis 1980, 1990s  . Hyperlipidemia   . OSA on CPAP     Past Surgical History:  Procedure Laterality Date  . CARDIAC CATHETERIZATION  08/07/2005   40-50% narrowing in LAD, 30-40% scattered irregularity of diagonal, subtotal-total prox Cfx occlusion w/collaterals of marginal 1, prox 30-40% RCA narrowing, 40-50% distal RCA stenosis (Dr. Corky Downs)  . CORONARY ANGIOPLASTY WITH STENT PLACEMENT  08/15/2005   stent to RCA and prox Cfx with 3x42mm DES to AV groove Cfx and 2.5x13 Cypher DES to marginal (Dr. Corky Downs)  . KNEE SURGERY  2004   right knee  . NM MYOCAR  PERF WALL MOTION  02/2012   lexiscan - normal perfusion, EF 66%, low risk   . SUBTOTAL COLECTOMY     in [place] r/t adenocarcinoma of colon   . TRANSTHORACIC ECHOCARDIOGRAM  12/2010   EF=>55%, mild conc LVH; borderline LA enlargement; calcification of anterior MV leaflets, mild MR; mild TR; RVSP 30-42mmHg; mild calcification of AV leaflets and mild-mod AV regurg; mild pulm regurg; aortic root sclerosis/calcification     Medications Prior to Admission: Prior to Admission medications   Medication Sig Start Date End Date Taking? Authorizing Provider  amLODipine (NORVASC) 5 MG tablet TAKE 1/2 TABLET DAILY 10/09/16   Lorretta Harp, MD  aspirin 81 MG tablet Take 81 mg by mouth daily.    [provider]  Cholecalciferol (VITAMIN D3) 400 UNITS CAPS Take 1 capsule by mouth daily.    [provider]  clopidogrel (PLAVIX) 75 MG tablet Take 1 tablet (75 mg total) by mouth daily. 11/13/16   Troy Sine, MD  co-enzyme Q-10 30 MG capsule Take 30 mg by mouth daily.    [provider]  fish oil-omega-3 fatty acids 1000 MG capsule Take 2 g by mouth daily.    [provider]  Glucosamine-Chondroit-Vit C-Mn (GLUCOSAMINE 1500 COMPLEX PO) Take 1 capsule by mouth daily.    [provider]  isosorbide mononitrate (IMDUR) 30 MG 24 hr tablet TAKE ONE (1) TABLET EACH DAY 06/05/16   Troy Sine, MD  levothyroxine (SYNTHROID, LEVOTHROID) 25 MCG tablet Take 25 mcg by mouth daily before breakfast.    [provider]  Misc Natural Products (TURMERIC CURCUMIN) CAPS Take 1 capsule by mouth daily.    [provider]  nitroGLYCERIN (NITROSTAT) 0.4 MG SL tablet Place 1 tablet (0.4 mg total) under the tongue every 5 (five) minutes as needed for chest pain. 04/14/15   Troy Sine, MD  NON FORMULARY CPAP therapy    [provider]  saw palmetto 160 MG capsule Take 160 mg by mouth daily.    [provider]  tamsulosin (FLOMAX) 0.4 MG CAPS  capsule Take 1 capsule (0.4 mg total) by mouth daily. 04/16/13   Pixie Casino, MD  VYTORIN 10-20 MG tablet TAKE ONE (1) TABLET EACH DAY AT BEDTIME 09/16/15   Troy Sine, MD     Allergies:   No Known Allergies  Social History:   Social History   Socioeconomic History  . Marital status: Married    Spouse name: Not on file  . Number of children: Not on file  . Years of education: Not on file  . Highest education level: Not on file  Social Needs  . Financial resource strain: Not on file  . Food insecurity - worry: Not on file  . Food insecurity - inability: Not on file  . Transportation needs - medical: Not on file  .  Transportation needs - non-medical: Not on file  Occupational History  . Not on file  Tobacco Use  . Smoking status: Never Smoker  . Smokeless tobacco: Never Used  Substance and Sexual Activity  . Alcohol use: Yes    Alcohol/week: 0.0 oz    Comment: ocassional glass of wine.  . Drug use: Not on file  . Sexual activity: Not on file  Other Topics Concern  . Not on file  Social History Narrative  . Not on file    Family History:   The patient's family history is not on file.    ROS:  Please see the history of present illness.  All other ROS reviewed and negative.   Except for  Palpitations, neck pain   Physical Exam/Data:   Vitals:   06/12/17 0117 06/12/17 0256 06/12/17 0300 06/12/17 0506  BP:  (!) 150/70  (!) (P) 155/90  Pulse:  (!) 57  (!) (P) 52  Resp:  20  (P) 20  Temp: 98.5 F (36.9 C) 98.6 F (37 C)  (P) 98.4 F (36.9 C)  TempSrc: Oral Oral  (P) Oral  SpO2:  94%  (P) 96%  Weight:   152 lb 4.8 oz (69.1 kg)   Height:   5\' 9"  (1.753 m)     Intake/Output Summary (Last 24 hours) at 06/12/2017 0634 Last data filed at 06/12/2017 0524 Gross per 24 hour  Intake 250.83 ml  Output 1100 ml  Net -849.17 ml   Filed Weights   06/11/17 1642 06/12/17 0300  Weight: 156 lb (70.8 kg) 152 lb 4.8 oz (69.1 kg)   Body mass index is 22.49 kg/m.  BP  (!) 159/59   Pulse (!) 54   Temp (!) 97.5 F (36.4 C) (Oral)   Resp 15   Ht 5\' 9"  (1.753 m)   Wt 70.8 kg (156 lb)   SpO2 100%   BMI 23.04 kg/m   Physical Exam  Constitutional: He is oriented to person, place, and time. He appears well-developed and well-nourished. No distress.  HENT:  Head: Normocephalic and atraumatic.  Right Ear: External ear normal.  Left Ear: External ear normal.  Nose: Nose normal.  Mouth/Throat: Oropharynx is clear and moist.  Eyes: Conjunctivae and EOM are normal. Pupils are equal, round, and reactive to light. Right eye exhibits no discharge. Left eye exhibits no discharge. No scleral icterus.  Neck: Normal range of motion. Neck supple.  Cardiovascular: Normal rate, regular rhythm and normal heart sounds. Exam reveals no gallop and no friction rub.  No murmur heard. Pulses:      Radial pulses are 2+ on the right side, and 2+ on the left side.       Dorsalis pedis pulses are 2+ on the right side, and 2+ on the left side.  Pulmonary/Chest: Effort normal and breath sounds normal. No stridor. No respiratory distress. He has no rales.  Abdominal: Soft. He exhibits no distension. There is no tenderness.  Musculoskeletal: He exhibits no edema or tenderness.       Left elbow: He exhibits normal range of motion and no deformity. No tenderness found.       Cervical back: He exhibits no bony tenderness.       Thoracic back: He exhibits no bony tenderness.       Lumbar back: He exhibits no bony tenderness.       Left upper arm: He exhibits no tenderness and no bony tenderness.       Left forearm: He  exhibits no tenderness and no bony tenderness.  Clavicle stable. Chest stable to AP/Lat compression. Pelvis stable to Lat compression. No obvious extremity deformity. No chest or abdominal wall contusion.  Neurological: He is alert and oriented to person, place, and time. GCS eye subscore is 4. GCS verbal subscore is 5. GCS motor subscore is 6.  Moving all  extremities   EKG:  [facility] and demonstrates Sinus bradycardia, No ACS  Changes.   Relevant CV Studies:  Nuclear Perfusion study  2018- Normal EF 60%.   Stress test was in October 2013 prior to a trip to [place] when he had experienced some vague symptoms of chest pain. The nuclear perfusion study was unchanged and continued to show fairly normal perfusion. He also has a history of obstructive sleep apnea for which he sees Dr. Keturah Barre.      Procedure Laterality Date  . CARDIAC CATHETERIZATION  08/07/2005   40-50% narrowing in LAD, 30-40% scattered irregularity of diagonal, subtotal-total prox Cfx occlusion w/collaterals of marginal 1, prox 30-40% RCA narrowing, 40-50% distal RCA stenosis (Dr. Corky Downs)  . CORONARY ANGIOPLASTY WITH STENT PLACEMENT  08/15/2005   stent to RCA and prox Cfx with 3x46mm DES to AV groove Cfx and 2.5x13 Cypher DES to marginal (Dr. Corky Downs)  . KNEE SURGERY  2004   right knee  . NM MYOCAR PERF WALL MOTION  02/2012   lexiscan - normal perfusion, EF 66%, low risk   .        Marland Kitchen TRANSTHORACIC ECHOCARDIOGRAM  12/2010   EF=>55%, mild conc LVH; borderline LA enlargement; calcification of anterior MV leaflets, mild MR; mild TR; RVSP 30-52mmHg; mild calcification of AV leaflets and mild-mod AV regurg; mild pulm regurg; aortic root sclerosis/calcification   Laboratory Data:  Chemistry Recent Labs  Lab 06/11/17 1801  NA 136  K 3.9  CL 105  CO2 25  GLUCOSE 80  BUN 17  CREATININE 1.16  CALCIUM 9.4  GFRNONAA 55*  GFRAA >60  ANIONGAP 6    Recent Labs  Lab 06/11/17 1801  PROT 7.3  ALBUMIN 3.5  AST 20  ALT 12*  ALKPHOS 103  BILITOT 0.6   Hematology Recent Labs  Lab 06/11/17 1801  WBC 5.8  RBC 4.70  HGB 13.1  HCT 38.4*  MCV 81.7  MCH 27.9  MCHC 34.1  RDW 15.0  PLT 97*   Cardiac Enzymes Recent Labs  Lab 06/11/17 1801 06/11/17 2008  TROPONINI <0.03 0.04*   No results for  input(s): TROPIPOC in the last 168 hours.  BNPNo results for input(s): BNP, PROBNP in the last 168 hours.  DDimer No results for input(s): DDIMER in the last 168 hours.  Radiology/Studies:  Dg Chest 2 View  Result Date: 06/11/2017 CLINICAL DATA:  Shortness of breath. EXAM: CHEST  2 VIEW COMPARISON:  Chest x-ray dated June 21, 2016. FINDINGS: The cardiomediastinal silhouette is normal in size. Normal pulmonary vascularity. No focal consolidation, pleural effusion, or pneumothorax. No acute osseous abnormality. IMPRESSION: No active cardiopulmonary disease. Electronically Signed   By: Titus Dubin M.D.   On: 06/11/2017 18:29    Assessment and Plan:   1. Elevated cardiac  Enzymes vs NSTEMI  Hep gtt, npo , Possible PCI eval   Nuclear Perfusion study  2018- Normal EF 60%.   Stress test was in October 2013 prior to a trip to [place] when he had experienced some vague symptoms of chest pain. The  nuclear perfusion study was unchanged and continued to show fairly normal perfusion. He also has a history of obstructive sleep apnea for which he sees Dr. Keturah Barre.     Procedure Laterality Date  . CARDIAC CATHETERIZATION  08/07/2005   40-50% narrowing in LAD, 30-40% scattered irregularity of diagonal, subtotal-total prox Cfx occlusion w/collaterals of marginal 1, prox 30-40% RCA narrowing, 40-50% distal RCA stenosis (Dr. Corky Downs)  . CORONARY ANGIOPLASTY WITH STENT PLACEMENT  08/15/2005   stent to RCA and prox Cfx with 3x62mm DES to AV groove Cfx and 2.5x13 Cypher DES to marginal (Dr. Corky Downs)    2. CAD   CARDIAC CATHETERIZATION  08/07/2005  40-50% narrowing in LAD, 30-40% scattered irregularity of diagonal, subtotal-total prox Cfx occlusion w/collaterals of marginal 1, prox 30-40% RCA narrowing, 40-50% distal RCA stenosis (Dr. Corky Downs)  CORONARY ANGIOPLASTY WITH STENT PLACEMENT  08/15/2005  stent to RCA and prox Cfx with 3x63mm DES to AV groove Cfx and 2.5x13 Cypher DES to marginal (Dr. Corky Downs)   Hep gtt, npo , Possible PCI eval   Mr  Tesch is an 84 year old gentleman who has a remote history of endocarditis x2 initially in 77 and subsequently into early 1990s.   He is 9 years status post intervention to his RCA and recanalization of the circumflex system which was noted to be occluded with collateralization from the LAD to the OM prior to intervention.   He has a 30x28 mm Cypher stent in the AV groove and 2.5x13 mm Cypher stent in the OM1 ostium. The RCA has a 3.0x23 mm Cypher stent in its midsegment.   He had developed probable sinus node dysfunction leading to discontinuance of beta blocker therapy.     Severity of Illness: The appropriate patient status for this patient is INPATIENT. Inpatient status is judged to be reasonable and necessary in order to provide the required intensity of service to ensure the patient's safety. The patient's presenting symptoms, physical exam findings, and initial radiographic and laboratory data in the context of their chronic comorbidities is felt to place them at high risk for further clinical deterioration. Furthermore, it is not anticipated that the patient will be medically stable for discharge from the hospital within 2 midnights of admission. The following factors support the patient status of inpatient.   " The patient's presenting symptoms include palpitations, neck pain  " The worrisome physical exam findings include neck pain , elevated  Cardiac  enzymes " The initial radiographic and laboratory data are worrisome because of  Troponin  [facility]  [facility] upto 0.04 " The chronic co-morbidities include  CAD, HTN    * I certify that at the point of admission it is my clinical judgment that the patient will require inpatient hospital care spanning beyond 2 midnights from the point of admission due to high intensity of service, high risk for further deterioration and high frequency of surveillance required.*     For questions or updates, please contact [place] Please consult [email or web address] for contact info under Cardiology/STEMI.    Signed, Johna Sheriff, MD  06/12/2017 6:34 AM

## 2017-06-12 NOTE — Progress Notes (Signed)
Paged Cardiologist to let them know patient's arrival on the unit (called [facility] admissions to confirm), and meds need to be reconciled. Pt is comfortable this time, getting heparin @9 .5, will continue to monitor  Palma Holter, RN

## 2017-06-12 NOTE — Progress Notes (Signed)
Paged cardiologist again pharmacy about pt's medicines reconciliation, waiting for them to show [facility] in [facility], no getting back any answers from MD.

## 2017-06-12 NOTE — Progress Notes (Signed)
New pt admission [facility]. Pt brought to the floor in a stable condition. Vitals taken. Initial Assessment done. All immediate pertinent needs to patient addressed. Patient Guide given to patient. Important safety instructions relating to hospitalization reviewed with patient. Patient verbalized understanding. Will continue to monitor pt.

## 2017-06-12 NOTE — Progress Notes (Signed)
MD called and asked patient to keep in NPO for possible procedure this morning, RN waiting for other orders for his home medicines this time  [place], [place]

## 2017-06-12 NOTE — Progress Notes (Signed)
Physical Therapy Evaluation/Discharge Patient Details Name: Anthony Petty MRN: [id number] DOB: 04/27/1928 Today's Date: 06/12/2017   History of Present Illness  84 y.o. male admitted on 06/11/17 for radiating neck pain, and fall with L arm skin tear.  Pt with mildly elevated troponins at 0.05 thought to be due to a tachy rythm.  Pt s/p stress test which was negative.  Cardiology following.  Pt with significant PMH of CAD, bacterial endocarditis, colon CA s/p colectomy, coronary angioplasty with stent, and R knee surgery.  Clinical Impression  Pt is at his baseline level of mobility HR in the 50s.  Limited to in room ambulation due to bed rest with bathroom privileges ordered.  He was able to demonstrate normal strength in all 4 extremities and independent short distance gait.  He walks for exercise daily with his wife.  He was being a bit aggressive during chair volleyball and running after a loose ball.  Other than this one incident he doesn't have a recent h/o falls.  HR in the 50s throughout mobility and balance assessment.  PT to sign off, but did recommend mobility tech, [place] come to see him for hallway ambulation tomorrow.     Follow Up Recommendations No PT follow up    Equipment Recommendations  None recommended by PT    Recommendations for Other Services   None    Precautions / Restrictions   None     Mobility  Bed Mobility Overal bed mobility: Independent                Transfers Overall transfer level: Independent                  Ambulation/Gait Ambulation/Gait assistance: Independent Ambulation Distance (Feet): 20 Feet(x2, limited because he is on bed rest w/bathroom priviledges) Assistive device: None Gait Pattern/deviations: WFL(Within Functional Limits)               Balance Overall balance assessment: Needs assistance Sitting-balance support: No upper extremity supported Sitting balance-Leahy Scale: Normal     Standing balance  support: No upper extremity supported Standing balance-Leahy Scale: Good Standing balance comment: Pt had difficulty with tandem and SLS.  I encouraged him to practice these at a sink at home.                               Pertinent Vitals/Pain Pain Assessment: No/denies pain    Home Living Family/patient expects to be discharged to:: Private residence(Independent living condo at [facility] Living Arrangements: Spouse/significant other Available Help at Discharge: Family;Available 24 hours/day Type of Home: Independent living facility Home Access: Level entry     Home Layout: One level        Prior Function Level of Independence: Independent         Comments: still drives, walks several miles a day with his wife, likes to play chair volleyball, enjoys "billards"     Hand Dominance   Dominant Hand: Right    Extremity/Trunk Assessment   Upper Extremity Assessment Upper Extremity Assessment: Overall WFL for tasks assessed(5/5 seated strength testing)    Lower Extremity Assessment Lower Extremity Assessment: Overall WFL for tasks assessed(5/5 gross strength testing)    Cervical / Trunk Assessment Cervical / Trunk Assessment: Normal  Communication   Communication: HOH  Cognition Arousal/Alertness: Awake/alert Behavior During Therapy: WFL for tasks assessed/performed Overall Cognitive Status: Within Functional Limits for tasks assessed  General Comments General comments (skin integrity, edema, etc.): HR in the 50s with in room gait.         Assessment/Plan    PT Assessment Patent does not need any further PT services         PT Goals (Current goals can be found in the Care Plan section)  Acute Rehab PT Goals Patient Stated Goal: to go home tomorrow if he can PT Goal Formulation: All assessment and education complete, DC therapy               AM-PAC PT "6 Clicks" Daily Activity   Outcome Measure Difficulty turning over in bed (including adjusting bedclothes, sheets and blankets)?: None Difficulty moving from lying on back to sitting on the side of the bed? : None Difficulty sitting down on and standing up from a chair with arms (e.g., wheelchair, bedside commode, etc,.)?: None Help needed moving to and from a bed to chair (including a wheelchair)?: None Help needed walking in hospital room?: None Help needed climbing 3-5 steps with a railing? : None 6 Click Score: 24    End of Session   Activity Tolerance: Patient tolerated treatment well Patient left: in bed;Other (comment)(seated EOB eating dinner) Nurse Communication: Mobility status;Other (comment)(asked for mobility tech referral) PT Visit Diagnosis: History of falling (Z91.81)    Time: 2863-8177 PT Time Calculation (min) (ACUTE ONLY): 16 min   Charges:   PT Evaluation $PT Eval Moderate Complexity: 1 Mod        Amonte Brookover B. [place], [place], DPT [phone]   06/12/2017, 6:14 PM

## 2017-06-12 NOTE — ED Notes (Signed)
Pt. Placed on foam mattress.  Pt. Belongings placed in bags with name tags on them.  Pt. Monitor on with call bell on bedside.  Pt. Reports he wants it as dark as poss. So he can sleep.  Pt. In no distress.

## 2017-06-12 NOTE — Progress Notes (Addendum)
Progress Note  Patient Name: Anthony Petty Date of Encounter: 06/12/2017  Primary Cardiologist: [name]  Subjective   No chest pain this morning.   Inpatient Medications    Scheduled Meds: . amLODipine  2.5 mg Oral Daily  . [START ON 06/13/2017] aspirin EC  81 mg Oral Daily  . clopidogrel  75 mg Oral Daily  . ezetimibe-simvastatin  1 tablet Oral q1800  . isosorbide mononitrate  15 mg Oral Daily  . levothyroxine  25 mcg Oral QAC breakfast  . tamsulosin  0.4 mg Oral Daily   Continuous Infusions: . heparin 950 Units/hr (06/11/17 2139)   PRN Meds: acetaminophen, nitroGLYCERIN, ondansetron (ZOFRAN) IV   Vital Signs    Vitals:   06/12/17 0256 06/12/17 0300 06/12/17 0506 06/12/17 0812  BP: (!) 150/70  (!) 155/90 (!) 148/53  Pulse: (!) 57  (!) 52 (!) 55  Resp: 20  20 18   Temp: 98.6 F (37 C)  98.4 F (36.9 C) 98.2 F (36.8 C)  TempSrc: Oral  Oral Oral  SpO2: 94%  96% 95%  Weight:  152 lb 4.8 oz (69.1 kg)    Height:  5\' 9"  (1.753 m)      Intake/Output Summary (Last 24 hours) at 06/12/2017 1039 Last data filed at 06/12/2017 0945 Gross per 24 hour  Intake 250.83 ml  Output 2100 ml  Net -1849.17 ml   Filed Weights   06/11/17 1642 06/12/17 0300  Weight: 156 lb (70.8 kg) 152 lb 4.8 oz (69.1 kg)    Telemetry    SB - Personally Reviewed  ECG    SB - Personally Reviewed  Physical Exam   General: Well developed, well nourished, older W male appearing in no acute distress. Head: Normocephalic, atraumatic.  Neck: Supple, no JVD. Lungs:  Resp regular and unlabored, CTA. Heart: RRR, S1, S2, no S3, S4, or murmur; no rub. Abdomen: Soft, non-tender, non-distended with normoactive bowel sounds.  Extremities: No clubbing, cyanosis, edema. Distal pedal pulses are 2+ bilaterally. Neuro: Alert and oriented X 3. Moves all extremities spontaneously. Psych: Normal affect.  Labs    Chemistry Recent Labs  Lab 06/11/17 1801 06/12/17 0637  NA 136 137  K 3.9 4.1  CL  105 105  CO2 25 22  GLUCOSE 80 88  BUN 17 14  CREATININE 1.16 0.97  CALCIUM 9.4 9.2  PROT 7.3  --   ALBUMIN 3.5  --   AST 20  --   ALT 12*  --   ALKPHOS 103  --   BILITOT 0.6  --   GFRNONAA 55* >60  GFRAA >60 >60  ANIONGAP 6 10     Hematology Recent Labs  Lab 06/11/17 1801  WBC 5.8  RBC 4.70  HGB 13.1  HCT 38.4*  MCV 81.7  MCH 27.9  MCHC 34.1  RDW 15.0  PLT 97*    Cardiac Enzymes Recent Labs  Lab 06/11/17 1801 06/11/17 2008 06/12/17 0637  TROPONINI <0.03 0.04* 0.05*   No results for input(s): TROPIPOC in the last 168 hours.   BNPNo results for input(s): BNP, PROBNP in the last 168 hours.   DDimer No results for input(s): DDIMER in the last 168 hours.    Radiology    Dg Chest 2 View  Result Date: 06/11/2017 CLINICAL DATA:  Shortness of breath. EXAM: CHEST  2 VIEW COMPARISON:  Chest x-ray dated June 21, 2016. FINDINGS: The cardiomediastinal silhouette is normal in size. Normal pulmonary vascularity. No focal consolidation, pleural effusion, or pneumothorax. No acute  osseous abnormality. IMPRESSION: No active cardiopulmonary disease. Electronically Signed   By: Titus Dubin M.D.   On: 06/11/2017 18:29    Cardiac Studies   N/a  Patient Profile     84 y.o. male with PMH of CAD s/p stenting to RCA ('07), HL, bacterial endocarditis and OSA on cpap who presented with palpitations.   Assessment & Plan    Principal Problem:   Atypical angina (HCC) Active Problems:   CAD S/P percutaneous coronary angioplasty   Abnormal nuclear stress test --intermediate risk with inferior ischemia   Hyperlipidemia with target LDL less than 70   Heart palpitations  1. Palpitations: Reports he developed palpitations while walking yesterday morning and again after playing chair volleyball. Developed some pressures in the sides of his neck after the palpitations. No chest pain or dyspnea. Very active 84 yo. Nothing on telemetry overnight. Likely plan for outpatient  monitor at the time of discharge.   2. Elevated troponin: 0.05, no chest pain. Could be in the setting of tachy rhythm. Discussed with MD and will plan for stress test today given low flat trend. Will stop IV heparin.   3. CAD s/p stent of RCA ('07) - Atypical Angina: On medical therapy with ASA, statin, plavix. No BB given bradycardia  4. Elevated TSH: No hx of the same. Will check free T4  5. HL: on statin  Signed, Reino Bellis, NP  06/12/2017, 10:39 AM  Pager # [phone]   For questions or updates, please contact [place] Please consult [email or web address] for contact info under Cardiology/STEMI.    I have seen, examined and evaluated the patient this morning on rounds along with Reino Bellis, NP.  I also then saw him in follow-up after his stress test was completed to discuss the results.  Her.  After reviewing all the available data and chart, we discussed the patients laboratory, study & physical findings as well as symptoms in detail. I agree with her findings, examination as well as impression recommendations as per our discussion.    Based on initial presenting symptoms, there was concern that this could have been ACS related symptoms versus simply an arrhythmia.  Since he had minimal troponin elevation, and the story was not overly convincing, I felt the best for him to proceed with a noninvasive ischemic evaluation first given his relatively unusual presentation.  Low threshold for cardiac catheterization that there was an abnormal finding on the stress test.  We did stop IV heparin as he is not had any further symptoms.  He did have a few runs of PAT or PSVT on telemetry noted, but he did not necessarily feel them.  I am somewhat concerned that the symptoms may be related to an arrhythmia I agree with Ria Comment on this.  However ischemic evaluation was warranted.  At roughly 5: 30-5:45 PM, his Myoview results were finalized: Normal EF of 66%.  However there is a reversible  perfusion defect in the inferoseptal wall.  Cannot exclude artifact versus ischemia.  This is read as an intermediate risk study, and based on the fact that he has had stents in the RCA, I feel it prudent to proceed with an invasive evaluation to confirm or deny the presence of inferior ischemia.  I did return to the patient's room  in the evening to explain the results to him and discussed the plans for cardiac catheterization.  He will be made n.p.o. after midnight  Performing MD:  [facility] IC Cardiologist.  Procedure: LEFT HEART CATHETERIZATION WITH  NATIVE CORONARY ANGIOGRAPHY AND POSSIBLE PERCUTANEOUS CORONARY INTERVENTION  The procedure with Risks/Benefits/Alternatives and Indications was reviewed with the patient .  All questions were answered.    Risks / Complications include, but not limited to: Death, MI, CVA/TIA, VF/VT (with defibrillation), Bradycardia (need for temporary pacer placement), contrast induced nephropathy, bleeding / bruising / hematoma / pseudoaneurysm, vascular or coronary injury (with possible emergent CT or Vascular Surgery), adverse medication reactions, infection.  Additional risks involving the use of radiation with the possibility of radiation burns and cancer were explained in detail.  The patient voices understanding and agrees to proceed.  His wife actually called in during our discussion and she understands the findings and recommendations as well.    Glenetta Hew, M.D., M.S. Interventional Cardiologist   Pager # [phone] Phone # [phone] [address]. [place], [place] [address]

## 2017-06-13 ENCOUNTER — Encounter (HOSPITAL_COMMUNITY)
Admission: EM | Disposition: A | Discharge status: Home / Self Care | Payer: Self-pay | Source: Home / Self Care | Attending: Cardiology

## 2017-06-13 DIAGNOSIS — I25110 Atherosclerotic heart disease of native coronary artery with unstable angina pectoris: Secondary | ICD-10-CM

## 2017-06-13 HISTORY — PX: LEFT HEART CATH AND CORONARY ANGIOGRAPHY: CATH118249

## 2017-06-13 HISTORY — PX: CORONARY STENT INTERVENTION: CATH118234

## 2017-06-13 LAB — CBC
HCT: 39.7 % (ref 39.0–52.0)
HEMOGLOBIN: 13.2 g/dL (ref 13.0–17.0)
MCH: 27.4 pg (ref 26.0–34.0)
MCHC: 33.2 g/dL (ref 30.0–36.0)
MCV: 82.4 fL (ref 78.0–100.0)
Platelets: 81 10*3/uL — ABNORMAL LOW (ref 150–400)
RBC: 4.82 MIL/uL (ref 4.22–5.81)
RDW: 14.6 % (ref 11.5–15.5)
WBC: 5.7 10*3/uL (ref 4.0–10.5)

## 2017-06-13 LAB — BASIC METABOLIC PANEL
Anion gap: 9 (ref 5–15)
BUN: 13 mg/dL (ref 6–20)
CALCIUM: 9.2 mg/dL (ref 8.9–10.3)
CO2: 20 mmol/L — ABNORMAL LOW (ref 22–32)
CREATININE: 1.09 mg/dL (ref 0.61–1.24)
Chloride: 111 mmol/L (ref 101–111)
GFR calc Af Amer: >60 mL/min (ref >60)
GFR, EST NON AFRICAN AMERICAN: 59 mL/min — AB (ref >60)
Glucose, Bld: 101 mg/dL — ABNORMAL HIGH (ref 65–99)
Potassium: 3.9 mmol/L (ref 3.5–5.1)
SODIUM: 140 mmol/L (ref 135–145)

## 2017-06-13 LAB — PROTIME-INR
INR: 1.09
Prothrombin Time: 14 seconds (ref 11.4–15.2)

## 2017-06-13 LAB — TROPONIN I: TROPONIN I: 0.04 ng/mL — AB (ref <0.03)

## 2017-06-13 LAB — POCT ACTIVATED CLOTTING TIME: Activated Clotting Time: 307 seconds

## 2017-06-13 SURGERY — LEFT HEART CATH AND CORONARY ANGIOGRAPHY
Anesthesia: LOCAL

## 2017-06-13 MED ORDER — ENOXAPARIN SODIUM 40 MG/0.4ML ~~LOC~~ SOLN
40.0000 mg | SUBCUTANEOUS | Status: DC
  Filled 2017-06-13: qty 0.4

## 2017-06-13 MED ORDER — VERAPAMIL HCL 2.5 MG/ML IV SOLN
INTRAVENOUS | Status: AC
  Filled 2017-06-13: qty 2

## 2017-06-13 MED ORDER — HEPARIN SODIUM (PORCINE) 1000 UNIT/ML IJ SOLN
INTRAMUSCULAR | Status: DC | PRN
  Administered 2017-06-13 (×2): 3500 [IU] via INTRAVENOUS

## 2017-06-13 MED ORDER — IOPAMIDOL (ISOVUE-370) INJECTION 76%
INTRAVENOUS | Status: AC
  Filled 2017-06-13: qty 100

## 2017-06-13 MED ORDER — MIDAZOLAM HCL 2 MG/2ML IJ SOLN
INTRAMUSCULAR | Status: DC | PRN
  Administered 2017-06-13: 1 mg via INTRAVENOUS

## 2017-06-13 MED ORDER — HEPARIN SODIUM (PORCINE) 1000 UNIT/ML IJ SOLN
INTRAMUSCULAR | Status: AC
  Filled 2017-06-13: qty 1

## 2017-06-13 MED ORDER — MIDAZOLAM HCL 2 MG/2ML IJ SOLN
INTRAMUSCULAR | Status: AC
  Filled 2017-06-13: qty 2

## 2017-06-13 MED ORDER — LIDOCAINE HCL (PF) 1 % IJ SOLN
INTRAMUSCULAR | Status: DC | PRN
  Administered 2017-06-13: 2 mL

## 2017-06-13 MED ORDER — CLOPIDOGREL BISULFATE 300 MG PO TABS
ORAL_TABLET | ORAL | Status: DC | PRN
  Administered 2017-06-13: 300 mg via ORAL

## 2017-06-13 MED ORDER — LIDOCAINE HCL (PF) 1 % IJ SOLN
INTRAMUSCULAR | Status: AC
  Filled 2017-06-13: qty 30

## 2017-06-13 MED ORDER — SODIUM CHLORIDE 0.9% FLUSH: 3.0000 mL | INTRAVENOUS | Status: DC | PRN

## 2017-06-13 MED ORDER — HEPARIN (PORCINE) IN NACL 2-0.9 UNIT/ML-% IJ SOLN
INTRAMUSCULAR | Status: AC
  Filled 2017-06-13: qty 1000

## 2017-06-13 MED ORDER — CLOPIDOGREL BISULFATE 300 MG PO TABS
ORAL_TABLET | ORAL | Status: AC
  Filled 2017-06-13: qty 1

## 2017-06-13 MED ORDER — VERAPAMIL HCL 2.5 MG/ML IV SOLN
INTRAVENOUS | Status: DC | PRN
  Administered 2017-06-13: 10 mL via INTRA_ARTERIAL

## 2017-06-13 MED ORDER — FENTANYL CITRATE (PF) 100 MCG/2ML IJ SOLN
INTRAMUSCULAR | Status: AC
  Filled 2017-06-13: qty 2

## 2017-06-13 MED ORDER — FENTANYL CITRATE (PF) 100 MCG/2ML IJ SOLN
INTRAMUSCULAR | Status: DC | PRN
  Administered 2017-06-13: 25 ug via INTRAVENOUS

## 2017-06-13 MED ORDER — SODIUM CHLORIDE 0.9% FLUSH
3.0000 mL | Freq: Two times a day (BID) | INTRAVENOUS | Status: DC
  Administered 2017-06-14: 3 mL via INTRAVENOUS

## 2017-06-13 MED ORDER — IOPAMIDOL (ISOVUE-370) INJECTION 76%
INTRAVENOUS | Status: DC | PRN
  Administered 2017-06-13: 120 mL via INTRAVENOUS

## 2017-06-13 MED ORDER — SODIUM CHLORIDE 0.9 % IV SOLN: 250.0000 mL | INTRAVENOUS | Status: DC | PRN

## 2017-06-13 MED ORDER — SODIUM CHLORIDE 0.9 % WEIGHT BASED INFUSION: 1.0000 mL/kg/h | INTRAVENOUS | Status: AC

## 2017-06-13 MED ORDER — IOPAMIDOL (ISOVUE-370) INJECTION 76%
INTRAVENOUS | Status: AC
  Filled 2017-06-13: qty 50

## 2017-06-13 SURGICAL SUPPLY — 20 items
BALLN SAPPHIRE 2.5X12 (BALLOONS) ×2
BALLN SAPPHIRE ~~LOC~~ 3.25X12 (BALLOONS) ×1 IMPLANT
BALLN WOLVERINE 3.00X15 (BALLOONS) ×2
BALLOON SAPPHIRE 2.5X12 (BALLOONS) IMPLANT
BALLOON WOLVERINE 3.00X15 (BALLOONS) IMPLANT
CATH LAUNCHER 6FR JR4 (CATHETERS) ×1 IMPLANT
CATH OPTITORQUE JACKY 4.0 5F (CATHETERS) ×1 IMPLANT
COVER PRB 48X5XTLSCP FOLD TPE (BAG) IMPLANT
COVER PROBE 5X48 (BAG) ×2
DEVICE RAD COMP TR BAND LRG (VASCULAR PRODUCTS) ×1 IMPLANT
GLIDESHEATH SLEND SS 6F .021 (SHEATH) ×1 IMPLANT
GUIDEWIRE INQWIRE 1.5J.035X260 (WIRE) IMPLANT
INQWIRE 1.5J .035X260CM (WIRE) ×2
KIT ENCORE 26 ADVANTAGE (KITS) ×1 IMPLANT
KIT HEART LEFT (KITS) ×2 IMPLANT
PACK CARDIAC CATHETERIZATION (CUSTOM PROCEDURE TRAY) ×2 IMPLANT
STENT SYNERGY DES 3X16 (Permanent Stent) ×1 IMPLANT
TRANSDUCER W/STOPCOCK (MISCELLANEOUS) ×2 IMPLANT
TUBING CIL FLEX 10 FLL-RA (TUBING) ×2 IMPLANT
WIRE MARVEL STR TIP 190CM (WIRE) ×1 IMPLANT

## 2017-06-13 NOTE — Interval H&P Note (Signed)
Cath Lab Visit (complete for each Cath Lab visit)  Clinical Evaluation Leading to the Procedure:   ACS: No.  Non-ACS:    Anginal Classification: CCS II  Anti-ischemic medical therapy: Minimal Therapy (1 class of medications)  Non-Invasive Test Results: Intermediate-risk stress test findings: cardiac mortality 1-3%/year  Prior CABG: No previous CABG      History and Physical Interval Note:  06/13/2017 2:25 PM  Anthony Petty  has presented today for surgery, with the diagnosis of abnormal stress test  The various methods of treatment have been discussed with the patient and family. After consideration of risks, benefits and other options for treatment, the patient has consented to  Procedure(s): LEFT HEART CATH AND CORONARY ANGIOGRAPHY (N/A) as a surgical intervention .  The patient's history has been reviewed, patient examined, no change in status, stable for surgery.  I have reviewed the patient's chart and labs.  Questions were answered to the patient's satisfaction.     Kathlyn [place]

## 2017-06-13 NOTE — Progress Notes (Signed)
TR BAND REMOVAL  LOCATION:    right radial  DEFLATED PER PROTOCOL:    Yes.    TIME BAND OFF / DRESSING APPLIED:    21:15   SITE UPON ARRIVAL:    Level 0  SITE AFTER BAND REMOVAL:    Level 0  CIRCULATION SENSATION AND MOVEMENT:    Within Normal Limits   Yes.    COMMENTS:    

## 2017-06-13 NOTE — Progress Notes (Signed)
RT placed patient on CPAP HS. NO 2 bleed in needed.

## 2017-06-13 NOTE — Plan of Care (Signed)
  Health Behavior/Discharge Planning: Ability to manage health-related needs will improve 06/13/2017 1019 - Progressing by Imagene Gurney, RN

## 2017-06-14 ENCOUNTER — Encounter (HOSPITAL_COMMUNITY): Payer: Self-pay | Admitting: Cardiovascular Disease

## 2017-06-14 DIAGNOSIS — E785 Hyperlipidemia, unspecified: Secondary | ICD-10-CM

## 2017-06-14 LAB — BASIC METABOLIC PANEL
Anion gap: 7 (ref 5–15)
BUN: 15 mg/dL (ref 6–20)
CHLORIDE: 108 mmol/L (ref 101–111)
CO2: 22 mmol/L (ref 22–32)
Calcium: 8.9 mg/dL (ref 8.9–10.3)
Creatinine, Ser: 1.06 mg/dL (ref 0.61–1.24)
Glucose, Bld: 86 mg/dL (ref 65–99)
POTASSIUM: 3.8 mmol/L (ref 3.5–5.1)
SODIUM: 137 mmol/L (ref 135–145)

## 2017-06-14 LAB — CBC
HEMATOCRIT: 37.1 % — AB (ref 39.0–52.0)
Hemoglobin: 12.2 g/dL — ABNORMAL LOW (ref 13.0–17.0)
MCH: 27.2 pg (ref 26.0–34.0)
MCHC: 32.9 g/dL (ref 30.0–36.0)
MCV: 82.6 fL (ref 78.0–100.0)
PLATELETS: 78 10*3/uL — AB (ref 150–400)
RBC: 4.49 MIL/uL (ref 4.22–5.81)
RDW: 14.7 % (ref 11.5–15.5)
WBC: 5.5 10*3/uL (ref 4.0–10.5)

## 2017-06-14 MED ORDER — ASPIRIN EC 81 MG PO TBEC
81.0000 mg | DELAYED_RELEASE_TABLET | Freq: Every day | ORAL | Status: DC
  Filled 2017-06-14: qty 1

## 2017-06-14 MED ORDER — ANGIOPLASTY BOOK
Freq: Once | Status: AC
  Administered 2017-06-14: 06:00:00
  Filled 2017-06-14: qty 1

## 2017-06-14 MED FILL — Heparin Sodium (Porcine) 2 Unit/ML in Sodium Chloride 0.9%: INTRAMUSCULAR | Qty: 500 | Status: AC

## 2017-06-14 NOTE — Consult Note (Signed)
           [facility] CM Primary Care Navigator  06/14/2017  Anthony Petty Mar 22, 1928 [id number]   Went to see patient at the bedside to identify possible discharge needs but he was already discharged per staff report.  Patient was discharged today. Patient is a resident at [facility] as confirmed with facility staff Anthony Petty) and reported that patient is being seen by Dr. Merilynn Finland (under [facility]) as his primary care provider for about 2 years now. Primary care provider's office notified Anthony Petty) of patient's discharge and need for post hospital follow-up and transition of care (TOC). Staff states will schedule.    For questions, please contact:  Dannielle Huh, BSN, RN- [facility] Primary Care Navigator  Telephone: [phone] [place]

## 2017-06-14 NOTE — Progress Notes (Signed)
CARDIAC REHAB PHASE I   PRE:  Rate/Rhythm: 76 SR (up at sink)    BP: sitting 132/49    SaO2:   MODE:  Ambulation: 700 ft   POST:  Rate/Rhythm: 78 SR    BP: sitting 149/43     SaO2:   Tolerated very well, very fit for age. No c/o. Steady. Ed completed. Good understanding of Plavix/ASA. Will refer to [place], ACSM 06/14/2017 9:03 AM

## 2017-06-14 NOTE — Discharge Summary (Addendum)
Discharge Summary    Patient ID: Anthony Petty,  MRN: [id number], DOB/AGE: Apr 09, 1928 84 y.o.  Admit date: 06/11/2017 Discharge date: 06/14/2017  Primary Care Provider: Javier Glazier Primary Cardiologist: [name]   Discharge Diagnoses    Principal Problem:   Atypical angina [facility]) Active Problems:   CAD S/P percutaneous coronary angioplasty   Hyperlipidemia with target LDL less than 70   Heart palpitations   Abnormal nuclear stress test --intermediate risk with inferior ischemia   Elevated troponin - Ruled out for NSTEMI - Final Dx of Atypical Angina is Unstable Angina  Allergies No Known Allergies  Diagnostic Studies/Procedures    Cath: 06/13/17  Conclusion     The left ventricular systolic function is normal.  LV end diastolic pressure is normal.  The left ventricular ejection fraction is greater than 65% by visual estimate.  Prox RCA to Mid RCA lesion is 95% stenosed.  Prox Cx to Dist Cx lesion is 20% stenosed.  Ost 2nd Mrg to 2nd Mrg lesion is 100% stenosed.  Prox LAD lesion is 60% stenosed.  Ost 1st Diag to 1st Diag lesion is 75% stenosed.  Ost 2nd Diag to 2nd Diag lesion is 80% stenosed.  A drug-eluting stent was successfully placed using a STENT SYNERGY DES 3X16.  Post intervention, there is a 0% residual stenosis.   1.  Severe two-vessel coronary artery disease.  Severe focal in-stent restenosis in the proximal right coronary artery (only in the proximal portion of the stent).  Patent stents in the left circumflex with occluded stent in OM 2 with well-developed collaterals from the LAD.  Moderate proximal LAD stenosis at the bifurcation of first diagonal. 2.  Hyperdynamic LV systolic function with normal left ventricular end-diastolic pressure. 3.  Successful cutting balloon angioplasty and drug-eluting stent placement to the proximal right coronary artery for severe in-stent restenosis.  Recommendations: Dual antiplatelet therapy for at  least one year.  Aggressive treatment of risk factors.  Treat the rest of coronary artery disease medically.   _____________   History of Present Illness     84 yo male with PMH of CAD s/p stenting to RCA ('07), HL, bacterial endocarditis and OSA on cpap. He is followed by Dr. [name] as an outpatient. Underwent cardiac cath back in 2007 with a total occlusion of the RCA with stenting. An echo Doppler study in August 2012 showed mild LVH with normal systolic function and grade 1 diastolic dysfunction. He had mild aortic sclerosis with mild-to-moderate aortic insufficiency, mild MR, mild TR and PR.   He has had issues of occasional palpitations, weakness and fatigue. His beta blocker dose was reduced from Lopressor 12.5 twice a day to 12.5 mg daily and has worn a CardioNet monitor in the past that showed an episode of PSVT. January 2017, he underwent an echo Doppler study on 01/17/2016. This showed an EF of 60-65%. He had grade 1 diastolic dysfunction. There was mild aortic insufficiency. There was mitral annular calcification. Developed sinus node dysfunction and his BB was stopped.   He underwent a nuclear perfusion study on 08/25/2016 which was normal. Ejection fraction was 61%. There were no ECG changes. There was no evidence for scar or ischemia. He also underwent carotid duplex imaging which showed mild header genius plaque with very mild narrowing in the 1-39% range. Antegrade flow in his vertebrals. He presented with palpitations and pressure in both sides of his neck. Troponin was mildly elevated and he was admitted for further work up.  Hospital Course   Underwent stress testing that was abnormal with reversibility in the inferoseptal wall, and reported as intermediate risk. Given this finding it was recommended that he undergo cardiac cath. Cath noted above with ISR in the [place]. Had successful cutting balloon with PCI/DES to [facility]. Noted residual disease in other vessels, with  patent stents in the left circumflex with occluded stent in OM 2 with well-developed collaterals from the LAD. Moderate pLAD stenosis at the bifurcation of first diagonal. with plans for medical therapy. Post cath labs were stable. Plan for DAPT with ASA/plavix. Post cath labs were stable. Worked with cardiac rehab without any recurrent symptoms.   General: Well developed, well nourished, male appearing in no acute distress. Head: Normocephalic, atraumatic.  Neck: Supple without bruits, JVD. Lungs:  Resp regular and unlabored, CTA. Heart: RRR, S1, S2, no S3, S4, systolic murmur; no rub. Abdomen: Soft, non-tender, non-distended with normoactive bowel sounds. No hepatomegaly. No rebound/guarding. No obvious abdominal masses. Extremities: No clubbing, cyanosis, edema. Distal pedal pulses are 2+ bilaterally. R radial cath site stable without bruising or hematoma Neuro: Alert and oriented X 3. Moves all extremities spontaneously. Psych: Normal affect.  Christipher Rieger was seen by Dr. Ellyn Hack and determined stable for discharge home. Follow up in the office has been arranged. Medications are listed below.   _____________  Discharge Vitals Blood pressure (!) 132/49, pulse 76, temperature (!) 97.4 F (36.3 C), temperature source Oral, resp. rate 20, height 5\' 9"  (1.753 m), weight 154 lb 5.2 oz (70 kg), SpO2 98 %.  Filed Weights   06/12/17 0300 06/13/17 0607 06/14/17 0527  Weight: 152 lb 4.8 oz (69.1 kg) 151 lb 1.6 oz (68.5 kg) 154 lb 5.2 oz (70 kg)    Labs & Radiologic Studies    CBC Recent Labs    06/13/17 0634 06/14/17 0317  WBC 5.7 5.5  HGB 13.2 12.2*  HCT 39.7 37.1*  MCV 82.4 82.6  PLT 81* 78*   Basic Metabolic Panel Recent Labs    06/13/17 0634 06/14/17 0317  NA 140 137  K 3.9 3.8  CL 111 108  CO2 20* 22  GLUCOSE 101* 86  BUN 13 15  CREATININE 1.09 1.06  CALCIUM 9.2 8.9   Liver Function Tests Recent Labs    06/11/17 1801  AST 20  ALT 12*  ALKPHOS 103  BILITOT  0.6  PROT 7.3  ALBUMIN 3.5   No results for input(s): LIPASE, AMYLASE in the last 72 hours. Cardiac Enzymes Recent Labs    06/13/17 0042 06/13/17 0634 06/13/17 1208  TROPONINI 0.04* <0.03 <0.03   BNP Invalid input(s): POCBNP D-Dimer No results for input(s): DDIMER in the last 72 hours. Hemoglobin A1C No results for input(s): HGBA1C in the last 72 hours. Fasting Lipid Panel No results for input(s): CHOL, HDL, LDLCALC, TRIG, CHOLHDL, LDLDIRECT in the last 72 hours. Thyroid Function Tests Recent Labs    06/12/17 0637  TSH 5.315*   _____________  Dg Chest 2 View  Result Date: 06/11/2017 CLINICAL DATA:  Shortness of breath. EXAM: CHEST  2 VIEW COMPARISON:  Chest x-ray dated June 21, 2016. FINDINGS: The cardiomediastinal silhouette is normal in size. Normal pulmonary vascularity. No focal consolidation, pleural effusion, or pneumothorax. No acute osseous abnormality. IMPRESSION: No active cardiopulmonary disease. Electronically Signed   By: Titus Dubin M.D.   On: 06/11/2017 18:29   Nm Myocar Multi W/spect W/wall Motion / Ef  Result Date: 06/12/2017 CLINICAL DATA:  84 year old with elevated troponin. EXAM: MYOCARDIAL IMAGING  WITH SPECT (REST AND PHARMACOLOGIC-STRESS) GATED LEFT VENTRICULAR WALL MOTION STUDY LEFT VENTRICULAR EJECTION FRACTION TECHNIQUE: Standard myocardial SPECT imaging was performed after resting intravenous injection of 10 mCi Tc-46m tetrofosmin. Subsequently, intravenous infusion of Lexiscan was performed under the supervision of the Cardiology staff. At peak effect of the drug, 30 mCi Tc-24m tetrofosmin was injected intravenously and standard myocardial SPECT imaging was performed. Quantitative gated imaging was also performed to evaluate left ventricular wall motion, and estimate left ventricular ejection fraction. COMPARISON:  None. FINDINGS: Perfusion: Moderate reversibility along the inferoseptal wall in the mid and basal segments. No other areas are  concerning for reversibility. Decreased uptake along the mid and basal segments of the inferior wall on both the rest and stress images. Wall Motion: Normal left ventricular wall motion. No left ventricular dilation. Left Ventricular Ejection Fraction: 66 % End diastolic volume 81 ml End systolic volume 28 ml IMPRESSION: 1. Reversibility involving the inferoseptal wall in the mid and basal segments. Findings could be artifactual but cannot exclude ischemia at this location. 2. Normal left ventricular wall motion. 3. Left ventricular ejection fraction is 66%. 4. Non invasive risk stratification*: Intermediate *2012 Appropriate Use Criteria for Coronary Revascularization Focused Update: J Am Coll Cardiol. 8676;19(5):093-267. http://content.airportbarriers.com.aspx?articleid=[email or web address] Electronically Signed   By: Markus Daft M.D.   On: 06/12/2017 17:21   Disposition   Pt is being discharged home today in good condition.  Follow-up Plans & Appointments    Follow-up Information    Lendon Colonel, NP Follow up on 06/26/2017.   Specialties:  Nurse Practitioner, Radiology, Cardiology Why:  at 2:30pm for your follow up appt.  Contact information: [address] [place] [address] [phone]          Discharge Instructions    AMB Referral to Cardiac Rehabilitation - Phase II   Complete by:  As directed    Diagnosis:  Coronary Stents   Call MD for:  redness, tenderness, or signs of infection (pain, swelling, redness, odor or green/yellow discharge around incision site)   Complete by:  As directed    Diet - low sodium heart healthy   Complete by:  As directed    Discharge instructions   Complete by:  As directed    Radial Site Care Refer to this sheet in the next few weeks. These instructions provide you with information on caring for yourself after your procedure. Your caregiver may also give you more specific instructions. Your treatment has been planned according to  current medical practices, but problems sometimes occur. Call your caregiver if you have any problems or questions after your procedure. HOME CARE INSTRUCTIONS You may shower the day after the procedure.Remove the bandage (dressing) and gently wash the site with plain soap and water.Gently pat the site dry.  Do not apply powder or lotion to the site.  Do not submerge the affected site in water for 3 to 5 days.  Inspect the site at least twice daily.  Do not flex or bend the affected arm for 24 hours.  No lifting over 5 pounds (2.3 kg) for 5 days after your procedure.  Do not drive home if you are discharged the same day of the procedure. Have someone else drive you.  You may drive 24 hours after the procedure unless otherwise instructed by your caregiver.  What to expect: Any bruising will usually fade within 1 to 2 weeks.  Blood that collects in the tissue (hematoma) may be painful to the touch. It should usually  decrease in size and tenderness within 1 to 2 weeks.  SEEK IMMEDIATE MEDICAL CARE IF: You have unusual pain at the radial site.  You have redness, warmth, swelling, or pain at the radial site.  You have drainage (other than a small amount of blood on the dressing).  You have chills.  You have a fever or persistent symptoms for more than 72 hours.  You have a fever and your symptoms suddenly get worse.  Your arm becomes pale, cool, tingly, or numb.  You have heavy bleeding from the site. Hold pressure on the site.   PLEASE DO NOT MISS ANY DOSES OF YOUR PLAVIX!!!!! Also keep a log of you blood pressures and bring back to your follow up appt. Please call the office with any questions.   Patients taking blood thinners should generally stay away from medicines like ibuprofen, Advil, Motrin, naproxen, and Aleve due to risk of stomach bleeding. You may take Tylenol as directed or talk to your primary doctor about alternatives.   Increase activity slowly   Complete by:  As directed         Discharge Medications     Medication List    TAKE these medications   amLODipine 5 MG tablet Commonly known as:  NORVASC TAKE 1/2 TABLET DAILY What changed:    how much to take  how to take this  when to take this   aspirin 81 MG tablet Take 81 mg by mouth daily.   clopidogrel 75 MG tablet Commonly known as:  PLAVIX Take 1 tablet (75 mg total) by mouth daily.   co-enzyme Q-10 30 MG capsule Take 30 mg by mouth daily.   fish oil-omega-3 fatty acids 1000 MG capsule Take 2 g by mouth daily.   GLUCOSAMINE 1500 COMPLEX PO Take 1 capsule by mouth daily.   isosorbide mononitrate 30 MG 24 hr tablet Commonly known as:  IMDUR TAKE ONE (1) TABLET EACH DAY What changed:  See the new instructions.   levothyroxine 25 MCG tablet Commonly known as:  SYNTHROID, LEVOTHROID Take 25 mcg by mouth daily before breakfast.   nitroGLYCERIN 0.4 MG SL tablet Commonly known as:  NITROSTAT Place 1 tablet (0.4 mg total) under the tongue every 5 (five) minutes as needed for chest pain.   NON FORMULARY CPAP therapy   saw palmetto 160 MG capsule Take 160 mg by mouth daily.   tamsulosin 0.4 MG Caps capsule Commonly known as:  FLOMAX Take 1 capsule (0.4 mg total) by mouth daily.   Turmeric Curcumin Caps Take 1 capsule by mouth daily.   Vitamin D3 400 units Caps Take 1 capsule by mouth daily.   VYTORIN 10-20 MG tablet Generic drug:  ezetimibe-simvastatin TAKE ONE (1) TABLET EACH DAY AT BEDTIME        Aspirin prescribed at discharge?  Yes High Intensity Statin Prescribed? (Lipitor 40-80mg  or Crestor 20-40mg ): No: On vytorin Beta Blocker Prescribed? No, Bradycardic For EF <40%, was ACEI/ARB Prescribed? No: EF ok ADP Receptor Inhibitor Prescribed? (i.e. Plavix etc.-Includes Medically Managed Patients): Yes For EF <40%, Aldosterone Inhibitor Prescribed? No: EF ok Was EF assessed during THIS hospitalization? Yes Was Cardiac Rehab II ordered? (Included Medically managed  Patients): Yes   Outstanding Labs/Studies   N/a   Duration of Discharge Encounter   Greater than 30 minutes including physician time.  Signed, Reino Bellis NP-C 06/14/2017, 8:45 AM

## 2017-06-18 ENCOUNTER — Other Ambulatory Visit: Payer: Self-pay | Admitting: Cardiovascular Disease

## 2017-06-19 ENCOUNTER — Encounter: Payer: Self-pay | Admitting: Adult Health

## 2017-06-26 ENCOUNTER — Ambulatory Visit (INDEPENDENT_AMBULATORY_CARE_PROVIDER_SITE_OTHER): Payer: Medicare Other | Admitting: Adult Health

## 2017-06-26 ENCOUNTER — Encounter: Payer: Self-pay | Admitting: Adult Health

## 2017-06-26 VITALS — BP 122/66 | HR 63 | Ht 69.0 in | Wt 157.4 lb

## 2017-06-26 DIAGNOSIS — E7800 Pure hypercholesterolemia, unspecified: Secondary | ICD-10-CM

## 2017-06-26 DIAGNOSIS — I2510 Atherosclerotic heart disease of native coronary artery without angina pectoris: Secondary | ICD-10-CM | POA: Diagnosis not present

## 2017-06-26 DIAGNOSIS — I10 Essential (primary) hypertension: Secondary | ICD-10-CM

## 2017-06-26 NOTE — Progress Notes (Signed)
Cardiology Office Note   Date:  06/26/2017   ID:  Anthony Petty, DOB 14-Mar-1928, MRN [id number]  PCP:  Javier Glazier, MD  Cardiologist: Dr. Shelva Majestic   Chief Complaint  Patient presents with  . Hospitalization Follow-up  . Coronary Artery Disease     History of Present Illness: Anthony Petty is a 84 y.o. male who presents for posthospitalization follow-up and ongoing assessment and management of coronary artery disease, history of stenting to the right coronary artery in 2007, PSVT per [facility], (unable to be placed on beta-blocker due to sinus node dysfunction) hyperlipidemia, hypertension.  Other history includes bacterial endocarditis previously.  He was recently hospitalized for recurrent chest discomfort after undergoing abnormal stress test which revealed reversibility in the inferior septal wall and reported as an immediate risk.  The patient was scheduled for cardiac catheterization.  Cardiac catheterization was completed on 06/13/2017 which revealed in-stent restenosis of the mid RCA.  Patient underwent successful cutting balloon with PCI drug-eluting stent to the mid RCA.  Was noted that there was no residual disease in other vessels, with patent stents in the left circumflex with occluded stent in the OM 2 with well-developed collaterals from the LAD.  There was noted to be moderate posterior LAD stenosis at the bifurcation of the first diagonal but this was to be treated medically.  He was continued on dual antiplatelet therapy with aspirin and Plavix.  He comes today without any further complaints of chest pain.  He is anxious to return to "chair volleyball", and taking walks with his wife.  It appears that his anginal equivalent is throat pain and fatigue.  He is not experienced this since discharge.  He continues to keep nitroglycerin with him for as needed use but has not had to use it.  Past Medical History:  Diagnosis Date  . Colon cancer ([place]) 1987  .  Coronary artery disease    06/13/17 ISR in mRCA with cutting balloon, PCI/DESx1, patent stents in Lcx, with occluded in OM, collaterals to the LAD  . History of bacterial endocarditis 1980, 1990s  . Hyperlipidemia   . OSA on CPAP     Past Surgical History:  Procedure Laterality Date  . CARDIAC CATHETERIZATION  08/07/2005   40-50% narrowing in LAD, 30-40% scattered irregularity of diagonal, subtotal-total prox Cfx occlusion w/collaterals of marginal 1, prox 30-40% RCA narrowing, 40-50% distal RCA stenosis (Dr. Corky Downs)  . CORONARY ANGIOPLASTY WITH STENT PLACEMENT  08/15/2005   stent to RCA and prox Cfx with 3x62mm DES to AV groove Cfx and 2.5x13 Cypher DES to marginal (Dr. Corky Downs)  . CORONARY STENT INTERVENTION N/A 06/13/2017   Procedure: CORONARY STENT INTERVENTION;  Surgeon: Wellington Hampshire, MD;  Location: [facility];  Service: Cardiovascular;  Laterality: N/A;  . KNEE SURGERY  2004   right knee  . LEFT HEART CATH AND CORONARY ANGIOGRAPHY N/A 06/13/2017   Procedure: LEFT HEART CATH AND CORONARY ANGIOGRAPHY;  Surgeon: Wellington Hampshire, MD;  Location: [facility];  Service: Cardiovascular;  Laterality: N/A;  . NM MYOCAR PERF WALL MOTION  02/2012   lexiscan - normal perfusion, EF 66%, low risk   . SUBTOTAL COLECTOMY     in [place] r/t adenocarcinoma of colon   . TRANSTHORACIC ECHOCARDIOGRAM  12/2010   EF=>55%, mild conc LVH; borderline LA enlargement; calcification of anterior MV leaflets, mild MR; mild TR; RVSP 30-75mmHg; mild calcification of AV leaflets and mild-mod AV regurg; mild pulm regurg; aortic root sclerosis/calcification  Current Outpatient Medications  Medication Sig Dispense Refill  . fluticasone (FLONASE) 50 MCG/ACT nasal spray Place 1 spray into both nostrils daily as needed.    Marland Kitchen amLODipine (NORVASC) 5 MG tablet TAKE 1/2 TABLET DAILY (Patient taking differently: TAKE 2.5 mg TABLET DAILY) 45 tablet 1  . aspirin 81 MG tablet Take 81 mg by mouth daily.      . Cholecalciferol (VITAMIN D3) 400 UNITS CAPS Take 1 capsule by mouth daily.    . clopidogrel (PLAVIX) 75 MG tablet Take 1 tablet (75 mg total) by mouth daily. 90 tablet 3  . co-enzyme Q-10 30 MG capsule Take 30 mg by mouth daily.    . fish oil-omega-3 fatty acids 1000 MG capsule Take 2 g by mouth daily.    . Glucosamine-Chondroit-Vit C-Mn (GLUCOSAMINE 1500 COMPLEX PO) Take 1 capsule by mouth daily.    . isosorbide mononitrate (IMDUR) 30 MG 24 hr tablet TAKE ONE (1) TABLET EACH DAY (Patient taking differently: TAKE ONE (1) TABLET (30mg ) EACH DAY) 90 tablet 0  . levothyroxine (SYNTHROID, LEVOTHROID) 25 MCG tablet Take 25 mcg by mouth daily before breakfast.    . Misc Natural Products (TURMERIC CURCUMIN) CAPS Take 1 capsule by mouth daily.    . nitroGLYCERIN (NITROSTAT) 0.4 MG SL tablet PLACE 1 TABLET UNDER THE TONGUE EVERY 5 MINUTES AS NEEDED FOR CHEST PAIN 25 tablet 3  . NON FORMULARY CPAP therapy    . saw palmetto 160 MG capsule Take 160 mg by mouth daily.    . tamsulosin (FLOMAX) 0.4 MG CAPS capsule Take 1 capsule (0.4 mg total) by mouth daily. 90 capsule 3  . VYTORIN 10-20 MG tablet TAKE ONE (1) TABLET EACH DAY AT BEDTIME 90 tablet 0   No current facility-administered medications for this visit.     Allergies:   Patient has no known allergies.    Social History:  The patient  reports that  has never smoked. he has never used smokeless tobacco. He reports that he drinks alcohol. He reports that he does not use drugs.   Family History:  The patient's family history is not on file.    ROS: All other systems are reviewed and negative. Unless otherwise mentioned in H&P    PHYSICAL EXAM: VS:  BP 122/66   Pulse 63   Ht 5\' 9"  (1.753 m)   Wt 157 lb 6.4 oz (71.4 kg)   SpO2 98%   BMI 23.24 kg/m  , BMI Body mass index is 23.24 kg/m. GEN: Well nourished, well developed, in no acute distress  HEENT: normal  Neck: no JVD, carotid bruits, or masses Cardiac: RRR; no murmurs, rubs, or  gallops,no edema  Respiratory:  clear to auscultation bilaterally, normal work of breathing GI: soft, nontender, nondistended, + BS MS: no deformity or atrophy Right wrist cath insertion site is well-healed.  No evidence of bleeding or infection. Skin: warm and dry, no rash Neuro:  Strength and sensation are intact Psych: euthymic mood, full affect  Recent Labs: 06/11/2017: ALT 12 06/12/2017: TSH 5.315 06/14/2017: BUN 15; Creatinine, Ser 1.06; Hemoglobin 12.2; Platelets 78; Potassium 3.8; Sodium 137    Lipid Panel No results found for: CHOL, TRIG, HDL, CHOLHDL, VLDL, LDLCALC, LDLDIRECT    Wt Readings from Last 3 Encounters:  06/26/17 157 lb 6.4 oz (71.4 kg)  06/14/17 154 lb 5.2 oz (70 kg)  10/19/16 166 lb (75.3 kg)      Other studies Reviewed: Cath: 06/13/17  Conclusion     The left ventricular  systolic function is normal.  LV end diastolic pressure is normal.  The left ventricular ejection fraction is greater than 65% by visual estimate.  Prox RCA to Mid RCA lesion is 95% stenosed.  Prox Cx to Dist Cx lesion is 20% stenosed.  Ost 2nd Mrg to 2nd Mrg lesion is 100% stenosed.  Prox LAD lesion is 60% stenosed.  Ost 1st Diag to 1st Diag lesion is 75% stenosed.  Ost 2nd Diag to 2nd Diag lesion is 80% stenosed.  A drug-eluting stent was successfully placed using a STENT SYNERGY DES 3X16.  Post intervention, there is a 0% residual stenosis.  1. Severe two-vessel coronary artery disease. Severe focal in-stent restenosis in the proximal right coronary artery (only in the proximal portion of the stent). Patent stents in the left circumflex with occluded stent in OM 2 with well-developed collaterals from the LAD. Moderate proximal LAD stenosis at the bifurcation of first diagonal. 2. Hyperdynamic LV systolic function with normal left ventricular end-diastolic pressure. 3. Successful cutting balloon angioplasty and drug-eluting stent placement to the proximal right  coronary artery for severe in-stent restenosis.  Recommendations: Dual antiplatelet therapy for at least one year. Aggressive treatment of risk factors. Treat the rest of coronary artery disease medically.    ASSESSMENT AND PLAN:  1.  Coronary artery disease: He is status post hospitalization in the setting of recurrent angina requiring cardiac catheterization and PCI.  He was found to have severe two-vessel CAD with severe focal in-stent restenosis of the proximal right coronary artery.  Patient had successful cutting balloon angioplasty and drug-eluting stent placement to the proximal right coronary artery.  He continues on medications prior to admission as he was also on dual antiplatelet therapy previous to NSTEMI.  Not make any changes at this time.  I have advised him to make sure he refills his nitroglycerin and keeps it current.  2.  Hypercholesterolemia: Continue statin therapy with Vytorin.  3.  Hypertension: Pressure is well controlled.  He remains on amlodipine and isosorbide mononitrate.   Current medicines are reviewed at length with the patient today.  Will follow up with Dr. [name] in May 2019 on annual visit.  Labs/ tests ordered today include: None  Phill Myron. West Pugh, ANP, AACC   06/26/2017 2:53 PM    [facility] [address] [place], [place] [address] Phone: [phone]; Fax: [phone]

## 2017-06-26 NOTE — Patient Instructions (Signed)
Medication Instructions:  Your physician recommends that you continue on your current medications as directed. Please refer to the Current Medication list given to you today.  Follow-Up: May with Dr. [name].  Any Other Special Instructions Will Be Listed Below (If Applicable).     If you need a refill on your cardiac medications before your next appointment, please call your pharmacy.

## 2017-07-04 ENCOUNTER — Ambulatory Visit (INDEPENDENT_AMBULATORY_CARE_PROVIDER_SITE_OTHER): Payer: Medicare Other | Admitting: Adult Health

## 2017-07-04 ENCOUNTER — Encounter: Payer: Self-pay | Admitting: Adult Health

## 2017-07-04 VITALS — BP 114/60 | HR 55 | Ht 69.0 in | Wt 158.0 lb

## 2017-07-04 DIAGNOSIS — I2510 Atherosclerotic heart disease of native coronary artery without angina pectoris: Secondary | ICD-10-CM

## 2017-07-04 DIAGNOSIS — G4733 Obstructive sleep apnea (adult) (pediatric): Secondary | ICD-10-CM | POA: Diagnosis not present

## 2017-07-04 DIAGNOSIS — Z9989 Dependence on other enabling machines and devices: Secondary | ICD-10-CM | POA: Diagnosis not present

## 2017-07-04 NOTE — Progress Notes (Signed)
PATIENT: Anthony Petty DOB: Jun 10, 1927  REASON FOR VISIT: follow up HISTORY FROM: patient  HISTORY OF PRESENT ILLNESS: Today 07/04/17 Anthony Petty is 84 year old male with a history of obstructive sleep apnea on CPAP.  He returns today for a compliance download.  Unfortunately the patient did not bring his machine with him today.  We are unable to obtain a wireless download.  The patient reports that he does use his CPAP nightly.  He denies any problems with his CPAP.  Denies leaking.  Denies any significant daytime sleepiness.  He does use the nasal pillows.  He does report that approximately 3 weeks ago he had a heart catheterization to replace 1 stent.  He reports that he spent approximately 3 nights in the hospital.  He denies any new neurological symptoms.  He returns today for an evaluation.  A significant   HISTORY 01/17/2016:01/17/2016. I have the pleasure of seeing our mutual patient Anthony Petty  Today, with undergone a new titration study on 10/06/2015. His baseline AHI was 27, in supine sleep his apnea increased to 37.8 point per hour of sleep and no REM sleep was noted. He was titrated to 15 cm water but did best at a pressure of 12. In supine his AHI became 0.0 in nonsupine 5.6 and overall averaged at 3.9.   REVIEW OF SYSTEMS: Out of a complete 14 system review of symptoms, the patient complains only of the following symptoms, and all other reviewed systems are negative.  See HPI  ALLERGIES: No Known Allergies  HOME MEDICATIONS: Outpatient Medications Prior to Visit  Medication Sig Dispense Refill  . amLODipine (NORVASC) 5 MG tablet TAKE 1/2 TABLET DAILY (Patient taking differently: TAKE 2.5 mg TABLET DAILY) 45 tablet 1  . aspirin 81 MG tablet Take 81 mg by mouth daily.    . Cholecalciferol (VITAMIN D3) 400 UNITS CAPS Take 1 capsule by mouth daily.    . clopidogrel (PLAVIX) 75 MG tablet Take 1 tablet (75 mg total) by mouth daily. 90 tablet 3  . co-enzyme  Q-10 30 MG capsule Take 30 mg by mouth daily.    . fish oil-omega-3 fatty acids 1000 MG capsule Take 2 g by mouth daily.    . fluticasone (FLONASE) 50 MCG/ACT nasal spray Place 1 spray into both nostrils daily as needed.    . Glucosamine-Chondroit-Vit C-Mn (GLUCOSAMINE 1500 COMPLEX PO) Take 1 capsule by mouth daily.    . isosorbide mononitrate (IMDUR) 30 MG 24 hr tablet TAKE ONE (1) TABLET EACH DAY (Patient taking differently: TAKE ONE (1) TABLET (30mg ) EACH DAY) 90 tablet 0  . levothyroxine (SYNTHROID, LEVOTHROID) 25 MCG tablet Take 25 mcg by mouth daily before breakfast.    . Misc Natural Products (TURMERIC CURCUMIN) CAPS Take 1 capsule by mouth daily.    . nitroGLYCERIN (NITROSTAT) 0.4 MG SL tablet PLACE 1 TABLET UNDER THE TONGUE EVERY 5 MINUTES AS NEEDED FOR CHEST PAIN 25 tablet 3  . NON FORMULARY CPAP therapy    . saw palmetto 160 MG capsule Take 160 mg by mouth daily.    . tamsulosin (FLOMAX) 0.4 MG CAPS capsule Take 1 capsule (0.4 mg total) by mouth daily. 90 capsule 3  . VYTORIN 10-20 MG tablet TAKE ONE (1) TABLET EACH DAY AT BEDTIME 90 tablet 0   No facility-administered medications prior to visit.     PAST MEDICAL HISTORY: Past Medical History:  Diagnosis Date  . Colon cancer ([place]) 1987  . Coronary artery disease  06/13/17 ISR in mRCA with cutting balloon, PCI/DESx1, patent stents in Lcx, with occluded in OM, collaterals to the LAD  . History of bacterial endocarditis 1980, 1990s  . Hyperlipidemia   . OSA on CPAP     PAST SURGICAL HISTORY: Past Surgical History:  Procedure Laterality Date  . CARDIAC CATHETERIZATION  08/07/2005   40-50% narrowing in LAD, 30-40% scattered irregularity of diagonal, subtotal-total prox Cfx occlusion w/collaterals of marginal 1, prox 30-40% RCA narrowing, 40-50% distal RCA stenosis (Dr. Corky Downs)  . CORONARY ANGIOPLASTY WITH STENT PLACEMENT  08/15/2005   stent to RCA and prox Cfx with 3x51mm DES to AV groove Cfx and 2.5x13 Cypher DES to marginal  (Dr. Corky Downs)  . CORONARY STENT INTERVENTION N/A 06/13/2017   Procedure: CORONARY STENT INTERVENTION;  Surgeon: Wellington Hampshire, MD;  Location: [facility];  Service: Cardiovascular;  Laterality: N/A;  . KNEE SURGERY  2004   right knee  . LEFT HEART CATH AND CORONARY ANGIOGRAPHY N/A 06/13/2017   Procedure: LEFT HEART CATH AND CORONARY ANGIOGRAPHY;  Surgeon: Wellington Hampshire, MD;  Location: [facility];  Service: Cardiovascular;  Laterality: N/A;  . NM MYOCAR PERF WALL MOTION  02/2012   lexiscan - normal perfusion, EF 66%, low risk   . SUBTOTAL COLECTOMY     in [place] r/t adenocarcinoma of colon   . TRANSTHORACIC ECHOCARDIOGRAM  12/2010   EF=>55%, mild conc LVH; borderline LA enlargement; calcification of anterior MV leaflets, mild MR; mild TR; RVSP 30-60mmHg; mild calcification of AV leaflets and mild-mod AV regurg; mild pulm regurg; aortic root sclerosis/calcification    FAMILY HISTORY: No family history on file.  SOCIAL HISTORY: Social History   Socioeconomic History  . Marital status: Married    Spouse name: Not on file  . Number of children: Not on file  . Years of education: Not on file  . Highest education level: Not on file  Social Needs  . Financial resource strain: Not on file  . Food insecurity - worry: Not on file  . Food insecurity - inability: Not on file  . Transportation needs - medical: Not on file  . Transportation needs - non-medical: Not on file  Occupational History  . Not on file  Tobacco Use  . Smoking status: Never Smoker  . Smokeless tobacco: Never Used  Substance and Sexual Activity  . Alcohol use: Yes    Alcohol/week: 0.0 oz    Comment: ocassional glass of wine.  . Drug use: No  . Sexual activity: Not on file  Other Topics Concern  . Not on file  Social History Narrative  . Not on file      PHYSICAL EXAM  Vitals:   07/04/17 1231  BP: 114/60  Pulse: (!) 55  Weight: 158 lb (71.7 kg)  Height: 5\' 9"  (1.753 m)   Body  mass index is 23.33 kg/m.  Generalized: Well developed, in no acute distress   Neurological examination  Mentation: Alert oriented to time, place, history taking. Follows all commands speech and language fluent Cranial nerve II-XII: Pupils were equal round reactive to light. Extraocular movements were full, visual field were full on confrontational test. Facial sensation and strength were normal. Uvula tongue midline. Head turning and shoulder shrug  were normal and symmetric. Motor: The motor testing reveals 5 over 5 strength of all 4 extremities. Good symmetric motor tone is noted throughout.  Sensory: Sensory testing is intact to soft touch on all 4 extremities. No evidence of  extinction is noted.  Coordination: Cerebellar testing reveals good finger-nose-finger and heel-to-shin bilaterally.  Gait and station: Gait is normal.    DIAGNOSTIC DATA (LABS, IMAGING, TESTING) - I reviewed patient records, labs, notes, testing and imaging myself where available.  Lab Results  Component Value Date   WBC 5.5 06/14/2017   HGB 12.2 (L) 06/14/2017   HCT 37.1 (L) 06/14/2017   MCV 82.6 06/14/2017   PLT 78 (L) 06/14/2017      Component Value Date/Time   NA 137 06/14/2017 0317   K 3.8 06/14/2017 0317   CL 108 06/14/2017 0317   CO2 22 06/14/2017 0317   GLUCOSE 86 06/14/2017 0317   BUN 15 06/14/2017 0317   CREATININE 1.06 06/14/2017 0317   CALCIUM 8.9 06/14/2017 0317   PROT 7.3 06/11/2017 1801   ALBUMIN 3.5 06/11/2017 1801   AST 20 06/11/2017 1801   ALT 12 (L) 06/11/2017 1801   ALKPHOS 103 06/11/2017 1801   BILITOT 0.6 06/11/2017 1801   GFRNONAA >60 06/14/2017 0317   GFRAA >60 06/14/2017 0317       ASSESSMENT AND PLAN 84 y.o. year old male  has a past medical history of Colon cancer ([place]) (1987), Coronary artery disease, History of bacterial endocarditis (1980, 1990s), Hyperlipidemia, and OSA on CPAP. here with:  1.  Obstructive sleep apnea on CPAP.  We were unable to review a  sleep download today.  The patient will go to his DME company and have them fax a download to our office.  Once I obtain a download I will call him with those results.  The patient is encouraged to continue using the CPAP nightly.  If his symptoms worsen or he develops any new symptoms he should let us know.  He will follow-up in 1 year or sooner if needed.  I spent 15 minutes with the patient. 50% of this time was spent discussing CPAP therapy.      Ward Givens, MSN, NP-C 07/04/2017, 1:22 PM [facility] [address], [place], [place] [address] [phone]

## 2017-07-04 NOTE — Patient Instructions (Signed)
Your Plan:  Continue using CPAP nightly Have [facility] send download If your symptoms worsen or you develop new symptoms please let us know.    Thank you for coming to see [name] at [facility]. I hope we have been able to provide you high quality care today.  You may receive a patient satisfaction survey over the next few weeks. We would appreciate your feedback and comments so that we may continue to improve ourselves and the health of our patients.

## 2017-07-05 NOTE — Progress Notes (Signed)
I agree with the assessment and plan as directed by NP .The patient is known to me .   Antavion Bartoszek, MD  

## 2017-10-12 ENCOUNTER — Encounter: Payer: Self-pay | Admitting: Cardiovascular Disease

## 2017-10-12 ENCOUNTER — Ambulatory Visit (INDEPENDENT_AMBULATORY_CARE_PROVIDER_SITE_OTHER): Payer: Medicare Other | Admitting: Cardiovascular Disease

## 2017-10-12 VITALS — BP 122/78 | HR 60 | Ht 68.0 in | Wt 158.2 lb

## 2017-10-12 DIAGNOSIS — I7300 Raynaud's syndrome without gangrene: Secondary | ICD-10-CM | POA: Diagnosis not present

## 2017-10-12 DIAGNOSIS — E785 Hyperlipidemia, unspecified: Secondary | ICD-10-CM

## 2017-10-12 DIAGNOSIS — Z79899 Other long term (current) drug therapy: Secondary | ICD-10-CM | POA: Diagnosis not present

## 2017-10-12 DIAGNOSIS — G4733 Obstructive sleep apnea (adult) (pediatric): Secondary | ICD-10-CM | POA: Diagnosis not present

## 2017-10-12 DIAGNOSIS — R001 Bradycardia, unspecified: Secondary | ICD-10-CM

## 2017-10-12 DIAGNOSIS — Z9989 Dependence on other enabling machines and devices: Secondary | ICD-10-CM

## 2017-10-12 DIAGNOSIS — Z9861 Coronary angioplasty status: Secondary | ICD-10-CM | POA: Diagnosis not present

## 2017-10-12 DIAGNOSIS — I10 Essential (primary) hypertension: Secondary | ICD-10-CM

## 2017-10-12 DIAGNOSIS — I2510 Atherosclerotic heart disease of native coronary artery without angina pectoris: Secondary | ICD-10-CM

## 2017-10-12 NOTE — Patient Instructions (Signed)
Medication Instructions:  Your physician recommends that you continue on your current medications as directed. Please refer to the Current Medication list given to you today.  Labwork: Please return for FASTING labs (CMET, CBC, Lipid, TSH)  Our in office lab hours are Monday-Friday 8:00-4:00, closed for lunch 12:45-1:45 pm.  No appointment needed.  Follow-Up: Your physician wants you to follow-up in: 6 months with Dr. Kelly. You will receive a reminder letter in the mail two months in advance. If you don't receive a letter, please call our office to schedule the follow-up appointment.   Any Other Special Instructions Will Be Listed Below (If Applicable).     If you need a refill on your cardiac medications before your next appointment, please call your pharmacy.   

## 2017-10-12 NOTE — Progress Notes (Signed)
Patient ID: Anthony Petty, male   DOB: Mar 10, 1928, 84 y.o.   MRN: [id number]   Primary M.D.: Dr. Antony Salmon, [facility]  HPI: Anthony Petty is a 84 y.o. male who presents for a 12 month follow-up cardiology evaluation with me.   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 He has known CAD and cardiac catheterization in 2007 showed total occlusion of the circumflex vessel as well as high-grade AV groove circumflex and RCA disease for which he underwent stenting of his RCA and recanalization of a totally occluded circumflex and marginal system. Additional problems include paroxysmal atrial fibrillation. An echo Doppler study in August 2012 showed mild LVH with normal systolic function and grade 1 diastolic dysfunction. He had mild aortic sclerosis with mild-to-moderate aortic insufficiency, mild MR, mild TR and PR. There is mild pulmonary hypertension with an estimated PA pressure 35 mm. His last stress test was in October 2013 prior to a trip to [place] when he had experienced some vague symptoms of chest pain. The nuclear perfusion study was unchanged and continued to show fairly normal perfusion. He also has a history of obstructive sleep apnea for which he sees Dr. Keturah Barre.  He has had issues of occasional palpitations, weakness and fatigue. His beta blocker dose was reduced from Lopressor 12.5 twice a day to 12.5 mg daily and last year had a CardioNet monitor.  CardioNet monitor demonstrated a 4 beat episode of PSVT. He also complained of "tingling "in the left upper chest region with inspiration.  He underwent a follow-up nuclear study which continued to reveal normal perfusion without scar or ischemia.   When I saw him in January 2015 complained that his fingers turning white particularly with cold weather on a routine basis. He has to wear gloves the entire winter season and at times there may be transient red and blue discoloration.  I  was concerned about the possibility of Raynaud's versus digital artery vasospasm and elected to start him on low-dose amlodipine at 2.5 mg which improved some of his symptomatology.  He has developed bradycardia in the past which was felt to be due to development of sinus node dysfunction.  He also was found to have an elevated PSA and underwent urologic evaluation.    He underwent an echo Doppler study on 01/17/2016.  This showed an EF of 60-65%.  He had grade 1 diastolic dysfunction.  There was mild aortic insufficiency.  There was mitral annular calcification.  He was seen by Anthony Petty in February 2018 with complaints of some mild chest pain and exertional fatigue.  He underwent a nuclear perfusion study on 08/25/2016 which was normal.  Ejection fraction was 61%.  There were no ECG changes.  There was no evidence for scar or ischemia.  He also underwent carotid duplex imaging which showed mild heterogenous plaque with very mild narrowing in the 1-39% range and normal antegrade flow in his vertebrals.   He lives at [facility].  His new primary physician is now Dr. Antony Salmon at [facility] who has checked laboratory.   Since I last saw him in May 2018, he was hospitalized in January 2019 with palpitations and pressure in both sides of his neck.  A nuclear perfusion study was interpreted as abnormal with inferoseptal reversibility.  He underwent repeat cardiac catheterization by Dr. Audelia Acton on 06/13/2017.  He was found to have 95% in-stent restenosis of his RCA stent.  Circumflex stent was patent and there  was occlusion of the OM 2 vessel with good collaterals.  There was concomitant disease in his diagonal and LAD, treated medically.  He was seen by Anthony Sims, NP on June 26 2017 in follow-up of his hospitalization.  Presently, he has been without recurrent chest pain symptomatology and denies any recurrence of symptoms since his last intervention.  He has not had recent fasting laboratory.  He  is unaware of palpitations, fever, chills or night sweats.  He presents for reevaluation.  Past Medical History:  Diagnosis Date  . Colon cancer ([place]) 1987  . Coronary artery disease    06/13/17 ISR in mRCA with cutting balloon, PCI/DESx1, patent stents in Lcx, with occluded in OM, collaterals to the LAD  . History of bacterial endocarditis 1980, 1990s  . Hyperlipidemia   . OSA on CPAP     Past Surgical History:  Procedure Laterality Date  . CARDIAC CATHETERIZATION  08/07/2005   40-50% narrowing in LAD, 30-40% scattered irregularity of diagonal, subtotal-total prox Cfx occlusion w/collaterals of marginal 1, prox 30-40% RCA narrowing, 40-50% distal RCA stenosis (Dr. Corky Downs)  . CORONARY ANGIOPLASTY WITH STENT PLACEMENT  08/15/2005   stent to RCA and prox Cfx with 3x54mm DES to AV groove Cfx and 2.5x13 Cypher DES to marginal (Dr. Corky Downs)  . CORONARY STENT INTERVENTION N/A 06/13/2017   Procedure: CORONARY STENT INTERVENTION;  Surgeon: Wellington Hampshire, MD;  Location: [facility];  Service: Cardiovascular;  Laterality: N/A;  . KNEE SURGERY  2004   right knee  . LEFT HEART CATH AND CORONARY ANGIOGRAPHY N/A 06/13/2017   Procedure: LEFT HEART CATH AND CORONARY ANGIOGRAPHY;  Surgeon: Wellington Hampshire, MD;  Location: [facility];  Service: Cardiovascular;  Laterality: N/A;  . NM MYOCAR PERF WALL MOTION  02/2012   lexiscan - normal perfusion, EF 66%, low risk   . SUBTOTAL COLECTOMY     in [place] r/t adenocarcinoma of colon   . TRANSTHORACIC ECHOCARDIOGRAM  12/2010   EF=>55%, mild conc LVH; borderline LA enlargement; calcification of anterior MV leaflets, mild MR; mild TR; RVSP 30-47mmHg; mild calcification of AV leaflets and mild-mod AV regurg; mild pulm regurg; aortic root sclerosis/calcification    No Known Allergies  Current Outpatient Medications  Medication Sig Dispense Refill  . amLODipine (NORVASC) 5 MG tablet TAKE 1/2 TABLET DAILY (Patient taking differently: TAKE 2.5  mg TABLET DAILY) 45 tablet 1  . aspirin 81 MG tablet Take 81 mg by mouth daily.    . cetirizine (ZYRTEC) 5 MG tablet Take 5 mg by mouth daily.    . Cholecalciferol (VITAMIN D3) 400 UNITS CAPS Take 1 capsule by mouth daily.    . clopidogrel (PLAVIX) 75 MG tablet Take 1 tablet (75 mg total) by mouth daily. 90 tablet 3  . co-enzyme Q-10 30 MG capsule Take 30 mg by mouth daily.    . fish oil-omega-3 fatty acids 1000 MG capsule Take 2 g by mouth daily.    . fluticasone (FLONASE) 50 MCG/ACT nasal spray Place 1 spray into both nostrils daily as needed.    . Glucosamine-Chondroit-Vit C-Mn (GLUCOSAMINE 1500 COMPLEX PO) Take 1 capsule by mouth daily.    . isosorbide mononitrate (IMDUR) 30 MG 24 hr tablet TAKE ONE (1) TABLET EACH DAY (Patient taking differently: TAKE ONE (1) TABLET (30mg ) EACH DAY) 90 tablet 0  . levothyroxine (SYNTHROID, LEVOTHROID) 25 MCG tablet Take 25 mcg by mouth daily before breakfast.    . Misc Natural Products (TURMERIC CURCUMIN) CAPS  Take 1 capsule by mouth daily.    . nitroGLYCERIN (NITROSTAT) 0.4 MG SL tablet PLACE 1 TABLET UNDER THE TONGUE EVERY 5 MINUTES AS NEEDED FOR CHEST PAIN 25 tablet 3  . NON FORMULARY CPAP therapy    . saw palmetto 160 MG capsule Take 160 mg by mouth daily.    . tamsulosin (FLOMAX) 0.4 MG CAPS capsule Take 1 capsule (0.4 mg total) by mouth daily. 90 capsule 3  . VYTORIN 10-20 MG tablet TAKE ONE (1) TABLET EACH DAY AT BEDTIME 90 tablet 0   No current facility-administered medications for this visit.     Social History   Socioeconomic History  . Marital status: Married    Spouse name: Not on file  . Number of children: Not on file  . Years of education: Not on file  . Highest education level: Not on file  Occupational History  . Not on file  Social Needs  . Financial resource strain: Not on file  . Food insecurity:    Worry: Not on file    Inability: Not on file  . Transportation needs:    Medical: Not on file    Non-medical: Not on file    Tobacco Use  . Smoking status: Never Smoker  . Smokeless tobacco: Never Used  Substance and Sexual Activity  . Alcohol use: Yes    Alcohol/week: 0.0 oz    Comment: ocassional glass of wine.  . Drug use: No  . Sexual activity: Not on file  Lifestyle  . Physical activity:    Days per week: Not on file    Minutes per session: Not on file  . Stress: Not on file  Relationships  . Social connections:    Talks on phone: Not on file    Gets together: Not on file    Attends religious service: Not on file    Active member of club or organization: Not on file    Attends meetings of clubs or organizations: Not on file    Relationship status: Not on file  . Intimate partner violence:    Fear of current or ex partner: Not on file    Emotionally abused: Not on file    Physically abused: Not on file    Forced sexual activity: Not on file  Other Topics Concern  . Not on file  Social History Narrative  . Not on file   Additional social history is notable that he is originally from [place]. He has 3 children 2 grandchildren. He does exercise. There is no alcohol or tobacco use.  Parents are deceased.  ROS General: Negative; No fevers, chills, or night sweats;  HEENT: Negative; No changes in vision or hearing, sinus congestion, difficulty swallowing Pulmonary: Negative; No cough, wheezing, shortness of breath, hemoptysis Cardiovascular: Negative; No chest pain, presyncope, syncope, palpitations GI: Negative; No nausea, vomiting, diarrhea, or abdominal pain GU: Negative; No dysuria, hematuria, or difficulty voiding Musculoskeletal: Negative; no myalgias, joint pain, or weakness Hematologic/Oncology: Negative; no easy bruising, bleeding Endocrine: Negative; no heat/cold intolerance; no diabetes Neuro: Negative; no changes in balance, headaches Skin: Negative; No rashes or skin lesions Psychiatric: Negative; No behavioral problems, depression Sleep: Positive for obstructive sleep apnea,  followed by Dr. Annamaria Boots; No snoring, daytime sleepiness, hypersomnolence, bruxism, restless legs, hypnogognic hallucinations, no cataplexy Other comprehensive 14 point system review is negative.   PE BP 122/78 (BP Location: Left Arm)   Pulse 60   Ht 5\' 8"  (1.727 m)   Wt 158 lb 3.2 oz (  71.8 kg)   BMI 24.05 kg/m    Repeat blood pressure 122/68  Wt Readings from Last 3 Encounters:  10/12/17 158 lb 3.2 oz (71.8 kg)  07/04/17 158 lb (71.7 kg)  06/26/17 157 lb 6.4 oz (71.4 kg)    General: Alert, oriented, no distress.  Skin: normal turgor, no rashes, warm and dry HEENT: Normocephalic, atraumatic. Pupils equal round and reactive to light; sclera anicteric; extraocular muscles intact;  Nose without nasal septal hypertrophy Mouth/Parynx benign; Mallinpatti scale 3 Neck: No JVD, no carotid bruits; normal carotid upstroke Lungs: clear to ausculatation and percussion; no wheezing or rales Chest wall: without tenderness to palpitation Heart: PMI not displaced, RRR, s1 s2 normal, 1/6 systolic murmur, no diastolic murmur, no rubs, gallops, thrills, or heaves Abdomen: soft, nontender; no hepatosplenomehaly, BS+; abdominal aorta nontender and not dilated by palpation. Back: no CVA tenderness Pulses 2+ Musculoskeletal: full range of motion, normal strength, no joint deformities Extremities: no clubbing cyanosis or edema, Homan's sign negative  Neurologic: grossly nonfocal; Cranial nerves grossly wnl Psychologic: Normal mood and affect   ECG (independently read by me): Normal sinus rhythm at 60 bpm.  PAC.  Normal intervals.  No ST segment changes.  May 2018 ECG (independently read by me): Sinus bradycardia 51 bpm.  Normal intervals.  No ST segment changes.  January 2017 ECG (independently read by me):  Sinus bradycardia 53 bpm.  No significant ST segment changes.  November 2016 ECG (independently read by me): Sinus bradycardia at 48 bpm.  April 2016 ECG (independently read by me): Sinus  bradycardia at 51 bpm.  No ectopy.  Normal intervals.  October 2015 ECG (independently read by me): Sinus bradycardia 58 beats per minute.  Normal intervals.  No ST segment changes.  LABS:  BMP Latest Ref Rng & Units 06/14/2017 06/13/2017 06/12/2017  Glucose 65 - 99 mg/dL 86 101(H) 105(H)  BUN 6 - 20 mg/dL 15 13 14   Creatinine 0.61 - 1.24 mg/dL 1.06 1.09 1.27(H)  Sodium 135 - 145 mmol/L 137 140 140  Potassium 3.5 - 5.1 mmol/L 3.8 3.9 3.6  Chloride 101 - 111 mmol/L 108 111 107  CO2 22 - 32 mmol/L 22 20(L) 20(L)  Calcium 8.9 - 10.3 mg/dL 8.9 9.2 9.6      Component Value Date/Time   PROT 7.3 06/11/2017 1801   ALBUMIN 3.5 06/11/2017 1801   AST 20 06/11/2017 1801   ALT 12 (L) 06/11/2017 1801   ALKPHOS 103 06/11/2017 1801   BILITOT 0.6 06/11/2017 1801       Component Value Date/Time   WBC 5.5 06/14/2017 0317   RBC 4.49 06/14/2017 0317   HGB 12.2 (L) 06/14/2017 0317   HCT 37.1 (L) 06/14/2017 0317   PLT 78 (L) 06/14/2017 0317   MCV 82.6 06/14/2017 0317   MCH 27.2 06/14/2017 0317   MCHC 32.9 06/14/2017 0317   RDW 14.7 06/14/2017 0317   LYMPHSABS 1.7 07/17/2007 1200   MONOABS 0.6 07/17/2007 1200   EOSABS 0.0 07/17/2007 1200   BASOSABS 0.0 07/17/2007 1200     BNP No results found for: PROBNP  Lipid Panel  No results found for: CHOL   RADIOLOGY: No results found.  IMPRESSION:  1. CAD in native artery   2. CAD S/P percutaneous coronary angioplasty and DES stent   3. Essential hypertension   4. Hyperlipidemia with target LDL less than 70   5. Medication management   6. Sinus bradycardia   7. Raynaud's phenomenon without gangrene   8. OSA on  CPAP     ASSESSMENT AND PLAN: Mr. Mapps is an 84 year old gentleman who has a remote history of endocarditis x2 initially in 12 and subsequently into early 1990s. He is 12 years status post intervention to his RCA and recanalization of the circumflex system which was noted to be occluded with collateralization from the LAD  to the OM prior to intervention. He has a 30x28 mm Cypher stent in the AV groove and 2.5x13 mm Cypher stent in the OM1 ostium. The RCA has a 3.0x23 mm Cypher stent in its midsegment.  He had developed probable sinus node dysfunction leading to discontinuance of beta blocker therapy.  He continues to be bradycardic with a heart rate 51.  He has a history of Raynaud's phenomenon.  I reviewed his hospitalization from January 2019 during my absence.  He was found to have high-grade in-stent restenosis of his mid RCA stent and underwent successful cutting balloon, and ultimate DES stenting of his mid RCA.  His AV groove circumflex stent was patent with an occluded stent to the marginal vessel which had developed collaterals from the LAD.  He has been without recurrent anginal type symptoms since his intervention.  His blood pressure today is controlled on amlodipine 5 mg, isosorbide 30 mg,.  He continues to be on aspirin and Plavix for dual antiplatelet therapy.  He is on Vytorin 10/20 and fish oil.  He has not had recent laboratory.  Fasting labs will be obtained.  Target LDL is less than 70.  He is on levothyroxine at 25 g for mild hypothyroidism.  I will contact him regarding his laboratory.  He continues to be on CPAP for his obstructive sleep apnea and is unaware of breakthrough snoring.  His sleep is restorative.  As long as he remains stable I will see him in 6 months for reevaluation.  Time spent: 25 minutes Troy Sine, MD, [facility]  10/14/2017 4:21 PM

## 2017-10-14 ENCOUNTER — Encounter: Payer: Self-pay | Admitting: Cardiovascular Disease

## 2017-11-01 LAB — CBC
Hematocrit: 35.2 % — ABNORMAL LOW (ref 37.5–51.0)
Hemoglobin: 11.7 g/dL — ABNORMAL LOW (ref 13.0–17.7)
MCH: 27.6 pg (ref 26.6–33.0)
MCHC: 33.2 g/dL (ref 31.5–35.7)
MCV: 83 fL (ref 79–97)
PLATELETS: 123 10*3/uL — AB (ref 150–450)
RBC: 4.24 x10E6/uL (ref 4.14–5.80)
RDW: 14.8 % (ref 12.3–15.4)
WBC: 5.9 10*3/uL (ref 3.4–10.8)

## 2017-11-01 LAB — COMPREHENSIVE METABOLIC PANEL
ALBUMIN: 4.1 g/dL (ref 3.5–4.7)
ALK PHOS: 161 IU/L — AB (ref 39–117)
ALT: 9 IU/L (ref 0–44)
AST: 16 IU/L (ref 0–40)
Albumin/Globulin Ratio: 1.4 (ref 1.2–2.2)
BILIRUBIN TOTAL: 0.7 mg/dL (ref 0.0–1.2)
BUN / CREAT RATIO: 17 (ref 10–24)
BUN: 21 mg/dL (ref 8–27)
CO2: 24 mmol/L (ref 20–29)
Calcium: 9.3 mg/dL (ref 8.6–10.2)
Chloride: 106 mmol/L (ref 96–106)
Creatinine, Ser: 1.26 mg/dL (ref 0.76–1.27)
GFR calc Af Amer: 58 mL/min/{1.73_m2} — ABNORMAL LOW (ref >59)
GFR calc non Af Amer: 51 mL/min/{1.73_m2} — ABNORMAL LOW (ref >59)
GLOBULIN, TOTAL: 2.9 g/dL (ref 1.5–4.5)
GLUCOSE: 90 mg/dL (ref 65–99)
Potassium: 4.6 mmol/L (ref 3.5–5.2)
SODIUM: 142 mmol/L (ref 134–144)
Total Protein: 7 g/dL (ref 6.0–8.5)

## 2017-11-01 LAB — LIPID PANEL
CHOL/HDL RATIO: 2.5 ratio (ref 0.0–5.0)
CHOLESTEROL TOTAL: 93 mg/dL — AB (ref 100–199)
HDL: 37 mg/dL — ABNORMAL LOW (ref >39)
LDL CALC: 43 mg/dL (ref 0–99)
TRIGLYCERIDES: 64 mg/dL (ref 0–149)
VLDL CHOLESTEROL CAL: 13 mg/dL (ref 5–40)

## 2017-11-01 LAB — TSH: TSH: 4.1 u[IU]/mL (ref 0.450–4.500)

## 2017-11-12 ENCOUNTER — Other Ambulatory Visit: Payer: Self-pay | Admitting: Cardiovascular Disease

## 2017-11-15 ENCOUNTER — Other Ambulatory Visit: Payer: Self-pay | Admitting: Cardiovascular Disease

## 2017-11-16 ENCOUNTER — Other Ambulatory Visit: Payer: Self-pay | Admitting: Cardiovascular Disease

## 2017-12-26 ENCOUNTER — Emergency Department (HOSPITAL_BASED_OUTPATIENT_CLINIC_OR_DEPARTMENT_OTHER)
Admission: EM | Admit: 2017-12-26 | Discharge: 2017-12-26 | Disposition: A | Discharge status: Home / Self Care | Payer: Medicare Other | Attending: Emergency Medicine | Admitting: Emergency Medicine

## 2017-12-26 ENCOUNTER — Other Ambulatory Visit: Payer: Self-pay

## 2017-12-26 ENCOUNTER — Encounter (HOSPITAL_BASED_OUTPATIENT_CLINIC_OR_DEPARTMENT_OTHER): Payer: Self-pay | Admitting: *Deleted

## 2017-12-26 DIAGNOSIS — I252 Old myocardial infarction: Secondary | ICD-10-CM | POA: Diagnosis not present

## 2017-12-26 DIAGNOSIS — Z7902 Long term (current) use of antithrombotics/antiplatelets: Secondary | ICD-10-CM | POA: Insufficient documentation

## 2017-12-26 DIAGNOSIS — Z79899 Other long term (current) drug therapy: Secondary | ICD-10-CM | POA: Diagnosis not present

## 2017-12-26 DIAGNOSIS — Z85038 Personal history of other malignant neoplasm of large intestine: Secondary | ICD-10-CM | POA: Insufficient documentation

## 2017-12-26 DIAGNOSIS — Z7982 Long term (current) use of aspirin: Secondary | ICD-10-CM | POA: Diagnosis not present

## 2017-12-26 DIAGNOSIS — E785 Hyperlipidemia, unspecified: Secondary | ICD-10-CM | POA: Diagnosis not present

## 2017-12-26 DIAGNOSIS — I2510 Atherosclerotic heart disease of native coronary artery without angina pectoris: Secondary | ICD-10-CM | POA: Insufficient documentation

## 2017-12-26 DIAGNOSIS — R339 Retention of urine, unspecified: Secondary | ICD-10-CM | POA: Diagnosis not present

## 2017-12-26 DIAGNOSIS — R310 Gross hematuria: Secondary | ICD-10-CM | POA: Insufficient documentation

## 2017-12-26 DIAGNOSIS — R338 Other retention of urine: Secondary | ICD-10-CM

## 2017-12-26 DIAGNOSIS — R319 Hematuria, unspecified: Secondary | ICD-10-CM | POA: Diagnosis present

## 2017-12-26 NOTE — ED Triage Notes (Signed)
Pt had urinary cath placed today. Pt reports that it is not draining and that he feels like he has multiple blood clots in his bladder-hx of same.

## 2017-12-26 NOTE — Discharge Instructions (Signed)
You were evaluated in the emergency department for bloody urine that had clogged your catheter.  We irrigated the catheter multiple times and ultimately improve the urine flow.  He will be important for you to monitor that the urine is continuing to flow and also to keep well-hydrated.  Please call your urologist tomorrow morning and let them know.  If your catheter becomes blocked again you will likely need to return to the hospital.

## 2017-12-26 NOTE — ED Notes (Signed)
Leg bag given to patient and instructed on how to change; verbalized understanding

## 2017-12-26 NOTE — ED Provider Notes (Signed)
[facility] EMERGENCY DEPARTMENT Provider Note   CSN: [id number] Arrival date & time: 12/26/17  1951     History   Chief Complaint Chief Complaint  Patient presents with  . Cystitis    HPI Anthony Petty is a 84 y.o. male.  He has a history of colon cancer and had radiation to that area.  He was seen in urology yesterday for difficulty passing urine is been going on for months.  The patient explained that the surgeon looked in with a camera and told him that he was getting need surgery and then placed a catheter.  He has been draining blood since then.  He is on Plavix and aspirin and was told to continue these medications until Thursday when he was going to stop his Plavix.  Patient states the catheter is been draining bloody fluid with some clots but since earlier this morning it does not seem to be draining and is had more pressure in his bladder.  He is following with Dr. Bertram Savin from med center.  The history is provided by the patient.  Abdominal Pain   This is a new problem. The current episode started 6 to 12 hours ago. The problem occurs constantly. The problem has not changed since onset.The pain is located in the suprapubic region. The pain is moderate. Associated symptoms include hematuria. Pertinent negatives include fever, diarrhea, melena, nausea, vomiting, constipation and headaches. Nothing aggravates the symptoms. Nothing relieves the symptoms.    Past Medical History:  Diagnosis Date  . Colon cancer ([place]) 1987  . Coronary artery disease    06/13/17 ISR in mRCA with cutting balloon, PCI/DESx1, patent stents in Lcx, with occluded in OM, collaterals to the LAD  . History of bacterial endocarditis 1980, 1990s  . Hyperlipidemia   . OSA on CPAP     Patient Active Problem List   Diagnosis Date Noted  . Abnormal nuclear stress test --intermediate risk with inferior ischemia 06/12/2017  . Elevated troponin 06/12/2017  . NSTEMI (non-ST elevated myocardial  infarction) ([place]) 06/11/2017  . Left carotid bruit 07/19/2016  . OSA on CPAP 09/15/2015  . Raynaud's disease 06/10/2013  . Atypical angina ([place]) 06/02/2013  . Bradycardia 05/16/2013  . Heart palpitations 05/16/2013  . Hyperlipidemia with target LDL less than 70 04/27/2013  . History of colon cancer 06/27/2007  . CAD S/P percutaneous coronary angioplasty 06/27/2007    Past Surgical History:  Procedure Laterality Date  . APPENDECTOMY    . CARDIAC CATHETERIZATION  08/07/2005   40-50% narrowing in LAD, 30-40% scattered irregularity of diagonal, subtotal-total prox Cfx occlusion w/collaterals of marginal 1, prox 30-40% RCA narrowing, 40-50% distal RCA stenosis (Dr. Corky Downs)  . CORONARY ANGIOPLASTY WITH STENT PLACEMENT  08/15/2005   stent to RCA and prox Cfx with 3x31mm DES to AV groove Cfx and 2.5x13 Cypher DES to marginal (Dr. Corky Downs)  . CORONARY STENT INTERVENTION N/A 06/13/2017   Procedure: CORONARY STENT INTERVENTION;  Surgeon: Wellington Hampshire, MD;  Location: [facility];  Service: Cardiovascular;  Laterality: N/A;  . KNEE SURGERY  2004   right knee  . LEFT HEART CATH AND CORONARY ANGIOGRAPHY N/A 06/13/2017   Procedure: LEFT HEART CATH AND CORONARY ANGIOGRAPHY;  Surgeon: Wellington Hampshire, MD;  Location: [facility];  Service: Cardiovascular;  Laterality: N/A;  . NM MYOCAR PERF WALL MOTION  02/2012   lexiscan - normal perfusion, EF 66%, low risk   . SUBTOTAL COLECTOMY     in [place]  r/t adenocarcinoma of colon   . TRANSTHORACIC ECHOCARDIOGRAM  12/2010   EF=>55%, mild conc LVH; borderline LA enlargement; calcification of anterior MV leaflets, mild MR; mild TR; RVSP 30-35mmHg; mild calcification of AV leaflets and mild-mod AV regurg; mild pulm regurg; aortic root sclerosis/calcification        Home Medications    Prior to Admission medications   Medication Sig Start Date End Date Taking? Authorizing Provider  amLODipine (NORVASC) 5 MG tablet TAKE 1/2 TABLET  DAILY Patient taking differently: TAKE 2.5 mg TABLET DAILY 10/09/16   Lorretta Harp, MD  aspirin 81 MG tablet Take 81 mg by mouth daily.    [provider]  cetirizine (ZYRTEC) 5 MG tablet Take 5 mg by mouth daily.    [provider]  Cholecalciferol (VITAMIN D3) 400 UNITS CAPS Take 1 capsule by mouth daily.    [provider]  clopidogrel (PLAVIX) 75 MG tablet TAKE ONE (1) TABLET EACH DAY 11/12/17   Troy Sine, MD  clopidogrel (PLAVIX) 75 MG tablet TAKE ONE (1) TABLET EACH DAY 11/16/17   Troy Sine, MD  co-enzyme Q-10 30 MG capsule Take 30 mg by mouth daily.    [provider]  fish oil-omega-3 fatty acids 1000 MG capsule Take 2 g by mouth daily.    [provider]  fluticasone (FLONASE) 50 MCG/ACT nasal spray Place 1 spray into both nostrils daily as needed. 06/12/16   [provider]  Glucosamine-Chondroit-Vit C-Mn (GLUCOSAMINE 1500 COMPLEX PO) Take 1 capsule by mouth daily.    [provider]  isosorbide mononitrate (IMDUR) 30 MG 24 hr tablet TAKE ONE (1) TABLET EACH DAY Patient taking differently: TAKE ONE (1) TABLET (30mg ) EACH DAY 06/05/16   Troy Sine, MD  levothyroxine (SYNTHROID, LEVOTHROID) 25 MCG tablet Take 25 mcg by mouth daily before breakfast.    [provider]  Misc Natural Products (TURMERIC CURCUMIN) CAPS Take 1 capsule by mouth daily.    [provider]  nitroGLYCERIN (NITROSTAT) 0.4 MG SL tablet PLACE 1 TABLET UNDER THE TONGUE EVERY 5 MINUTES AS NEEDED FOR CHEST PAIN 06/18/17   Troy Sine, MD  NON FORMULARY CPAP therapy    [provider]  saw palmetto 160 MG capsule Take 160 mg by mouth daily.    [provider]  tamsulosin (FLOMAX) 0.4 MG CAPS capsule Take 1 capsule (0.4 mg total) by mouth daily. 04/16/13   Pixie Casino, MD  VYTORIN 10-20 MG tablet TAKE ONE (1) TABLET EACH DAY AT BEDTIME 09/16/15   Troy Sine, MD    Family History History reviewed.  No pertinent family history.  Social History Social History   Tobacco Use  . Smoking status: Never Smoker  . Smokeless tobacco: Never Used  Substance Use Topics  . Alcohol use: Yes    Alcohol/week: 0.0 oz    Comment: ocassional glass of wine.  . Drug use: No     Allergies   Patient has no known allergies.   Review of Systems Review of Systems  Constitutional: Negative for fever.  HENT: Negative for sore throat.   Eyes: Negative for visual disturbance.  Respiratory: Negative for shortness of breath.   Cardiovascular: Negative for chest pain.  Gastrointestinal: Positive for abdominal pain. Negative for constipation, diarrhea, melena, nausea and vomiting.  Genitourinary: Positive for hematuria.  Musculoskeletal: Negative for back pain.  Skin: Negative for rash.  Neurological: Negative for headaches.     Physical Exam Updated Vital Signs BP Marland Kitchen)  145/58 (BP Location: Right Arm)   Pulse 74   Temp 98.7 F (37.1 C) (Oral)   Resp 18   Ht 5\' 9"  (1.753 m)   Wt 66.2 kg (146 lb)   SpO2 100%   BMI 21.56 kg/m   Physical Exam  Constitutional: He appears well-developed and well-nourished.  HENT:  Head: Normocephalic and atraumatic.  Eyes: Conjunctivae are normal.  Neck: Neck supple.  Cardiovascular: Normal rate, regular rhythm and normal heart sounds.  Pulmonary/Chest: Effort normal.  Abdominal: Soft. He exhibits no mass. There is no guarding.  Genitourinary: Uncircumcised.  Genitourinary Comments: Is a Foley catheter in place as an 18 coud.  He is got minimal drainage from it.  The nurses gently irrigated with 60 cc of saline and is able to get some clots out and now the urine is red but more free-flowing.  We will check a bladder scan on him to see if he is got further signs of retention.  Clinically he feels improved since the urine started flowing.  Musculoskeletal: Normal range of motion. He exhibits no tenderness.  Neurological: He is alert. GCS eye subscore is 4. GCS  verbal subscore is 5. GCS motor subscore is 6.  Skin: Skin is warm and dry.  Psychiatric: He has a normal mood and affect.  Nursing note and vitals reviewed.    ED Treatments / Results  Labs (all labs ordered are listed, but only abnormal results are displayed) Labs Reviewed - No data to display  EKG None  Radiology No results found.  Procedures Procedures (including critical care time)  Medications Ordered in ED Medications - No data to display   Initial Impression / Assessment and Plan / ED Course  I have reviewed the triage vital signs and the nursing notes.  Pertinent labs & imaging results that were available during my care of the patient were reviewed by me and considered in my medical decision making (see chart for details).  Clinical Course as of Dec 27 1709  Wed Dec 26, 2017  2211 Evaluated catheter and now is not draining again.  I have manually irrigated him with about a liter of saline and got a good amount of clot out.  He now has gross hematuria but seems to be free-flowing.  Go back and check on a little bit.   [MB]  2249 Irrigated the patient with another thousand cc in the urine is now more pink-colored.  There is no more clots and he seems to be free-flowing.  I think it would be reasonable to get him home tonight and he understands he needs to talk to his urologist tomorrow.  He understands is important to well-hydrated and to monitor the fact that the urine is still flowing.   [MB]    Clinical Course User Index [MB] Hayden Rasmussen, MD    Final Clinical Impressions(s) / ED Diagnoses   Final diagnoses:  Gross hematuria  Acute urinary retention    ED Discharge Orders    None       Hayden Rasmussen, MD 12/27/17 1712

## 2017-12-26 NOTE — ED Notes (Signed)
At bedside with Dr Melina Copa to assist with foley irrigation; patient tolerated well; lots of blood clots noted with irrigation; foley draining well afterwards. Foley draining bright red clear urine.

## 2018-01-06 ENCOUNTER — Encounter: Payer: Self-pay | Admitting: Adult Health

## 2018-01-07 ENCOUNTER — Telehealth: Payer: Self-pay | Admitting: Adult Health

## 2018-01-07 DIAGNOSIS — Z9989 Dependence on other enabling machines and devices: Principal | ICD-10-CM

## 2018-01-07 DIAGNOSIS — G4733 Obstructive sleep apnea (adult) (pediatric): Secondary | ICD-10-CM

## 2018-01-07 NOTE — Telephone Encounter (Signed)
I contacted the patient to get further information. Patient's last office visit with our clinic was 07/04/2017. During the appointment the patient did not bring his CPAP machine and we were unable to download. I contacted his DME [facility] and was advised the last download for his CPAP they had on file was in February of 2019. I spoke with Hedwig Morton NP verbally about this and she advised to request the patient to take his machine to [facility] and get a updated compliance report. I contacted the patient and advised on instructions and verbalized understanding. He stated he would take his machine to [facility] either today or tomorrow. Patient had no further questions or concerns at this time. Once we receive the report NP will review and make recommendations. MB, RN.

## 2018-01-07 NOTE — Telephone Encounter (Signed)
Pt said the pressure is too high on CPAP. His ears pop, he has to sleep in a recliner. Pt also said he has been dx with prostate cancer and was released from the hospital on Saturday 8/10. Please call to advise

## 2018-01-08 NOTE — Telephone Encounter (Addendum)
I contacted the DME with the updated settings and faxed over the updated order. (DME is [facility] phone [phone] fax [phone]).  Patient notified of updates and changes with CPAP. He voiced understanding and stated he would call [facility] tomorrow to set up a time to have his settings changed.  Patient had no further questions or concerns at this time.

## 2018-01-08 NOTE — Addendum Note (Signed)
Addended by: Trudie Buckler on: 01/08/2018 03:20 PM   Modules accepted: Orders

## 2018-01-08 NOTE — Telephone Encounter (Addendum)
Patient CPAP download indicates that he uses machine 20 out of 30 days for compliance of 66.7%.  He uses machine on average 8 hours and 18 minutes.  He uses machine greater than 4 hours on 63.3% of the time.  His residual AHI is 6.  His pressure set between 10-15.  I will adjust his pressure to 8 -15 cmh20 in order to see if this is more comfortable for him.

## 2018-01-08 NOTE — Telephone Encounter (Signed)
Contacted the patient and advised we have received the fax report from [facility] regarding CPAP compliance. Report given to NP to review and advise.

## 2018-01-14 ENCOUNTER — Telehealth: Payer: Self-pay | Admitting: Adult Health

## 2018-01-14 NOTE — Telephone Encounter (Signed)
Error

## 2018-01-14 NOTE — Addendum Note (Signed)
Addended by: Verlin Grills T on: 01/14/2018 03:29 PM   Modules accepted: Orders

## 2018-01-14 NOTE — Telephone Encounter (Addendum)
Patient called requesting a CPAP pressure change. On 01/08/2018 we adjusted his pressure to 8-15 cmh2O. Patient stated while using this pressure, his ears would pop and his head felt tight. Patient wanted to know if we could adjust his pressure to 6-13 cmH2O.  I discussed with Hedwig Morton NP and per verbal order ok to change pressure to 6-13 cmH20. Patient notified we have approved the change in pressure and [facility] to [facility](fax [phone]).  MB RN.

## 2018-01-14 NOTE — Telephone Encounter (Signed)
Pt has called stating that he has recently been diagnosed with prostate cancer.  Pt is asking if once more there can be a change made from 8-15 to 6-13.  Please call

## 2018-02-08 ENCOUNTER — Telehealth: Payer: Self-pay | Admitting: Cardiovascular Disease

## 2018-02-08 NOTE — Telephone Encounter (Signed)
Received records from [facility] on 02/08/18, Appt 04/07/18 @ 4:00PM. [place]

## 2018-05-07 ENCOUNTER — Encounter (INDEPENDENT_AMBULATORY_CARE_PROVIDER_SITE_OTHER): Payer: Self-pay

## 2018-05-07 ENCOUNTER — Encounter: Payer: Self-pay | Admitting: Cardiovascular Disease

## 2018-05-07 ENCOUNTER — Ambulatory Visit (INDEPENDENT_AMBULATORY_CARE_PROVIDER_SITE_OTHER): Payer: Medicare Other | Admitting: Cardiovascular Disease

## 2018-05-07 VITALS — BP 156/72 | HR 52 | Ht 69.0 in | Wt 147.0 lb

## 2018-05-07 DIAGNOSIS — R339 Retention of urine, unspecified: Secondary | ICD-10-CM

## 2018-05-07 DIAGNOSIS — G4733 Obstructive sleep apnea (adult) (pediatric): Secondary | ICD-10-CM | POA: Diagnosis not present

## 2018-05-07 DIAGNOSIS — C7951 Secondary malignant neoplasm of bone: Secondary | ICD-10-CM

## 2018-05-07 DIAGNOSIS — R001 Bradycardia, unspecified: Secondary | ICD-10-CM

## 2018-05-07 DIAGNOSIS — Z9861 Coronary angioplasty status: Secondary | ICD-10-CM

## 2018-05-07 DIAGNOSIS — C61 Malignant neoplasm of prostate: Secondary | ICD-10-CM

## 2018-05-07 DIAGNOSIS — E785 Hyperlipidemia, unspecified: Secondary | ICD-10-CM

## 2018-05-07 DIAGNOSIS — I2510 Atherosclerotic heart disease of native coronary artery without angina pectoris: Secondary | ICD-10-CM

## 2018-05-07 DIAGNOSIS — I10 Essential (primary) hypertension: Secondary | ICD-10-CM

## 2018-05-07 DIAGNOSIS — Z9989 Dependence on other enabling machines and devices: Secondary | ICD-10-CM

## 2018-05-07 NOTE — Progress Notes (Signed)
Patient ID: Anthony Petty, male   DOB: 1928-02-22, 84 y.o.   MRN: [id number]   Primary M.D.: Dr. Antony Salmon, [facility]  HPI: Anthony Petty is a 84 y.o. male who presents for a 6 month follow-up cardiology evaluation with me.   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 He has known CAD and cardiac catheterization in 2007 showed total occlusion of the circumflex vessel as well as high-grade AV groove circumflex and RCA disease for which he underwent stenting of his RCA and recanalization of a totally occluded circumflex and marginal system. Additional problems include paroxysmal atrial fibrillation. An echo Doppler study in August 2012 showed mild LVH with normal systolic function and grade 1 diastolic dysfunction. He had mild aortic sclerosis with mild-to-moderate aortic insufficiency, mild MR, mild TR and PR. There is mild pulmonary hypertension with an estimated PA pressure 35 mm. His last stress test was in October 2013 prior to a trip to [place] when he had experienced some vague symptoms of chest pain. The nuclear perfusion study was unchanged and continued to show fairly normal perfusion. He also has a history of obstructive sleep apnea for which he sees Dr. Keturah Barre.  He has had issues of occasional palpitations, weakness and fatigue. His beta blocker dose was reduced from Lopressor 12.5 twice a day to 12.5 mg daily and last year had a CardioNet monitor.  CardioNet monitor demonstrated a 4 beat episode of PSVT. He also complained of "tingling "in the left upper chest region with inspiration.  He underwent a follow-up nuclear study which continued to reveal normal perfusion without scar or ischemia.   When I saw him in January 2015 complained that his fingers turning white particularly with cold weather on a routine basis. He has to wear gloves the entire winter season and at times there may be transient red and blue discoloration.  I  was concerned about the possibility of Raynaud's versus digital artery vasospasm and elected to start him on low-dose amlodipine at 2.5 mg which improved some of his symptomatology.  He has developed bradycardia in the past which was felt to be due to development of sinus node dysfunction.  He also was found to have an elevated PSA and underwent urologic evaluation.    He underwent an echo Doppler study on 01/17/2016.  This showed an EF of 60-65%.  He had grade 1 diastolic dysfunction.  There was mild aortic insufficiency.  There was mitral annular calcification.  He was seen by Kerin Ransom in February 2018 with complaints of some mild chest pain and exertional fatigue.  He underwent a nuclear perfusion study on 08/25/2016 which was normal.  Ejection fraction was 61%.  There were no ECG changes.  There was no evidence for scar or ischemia.  He also underwent carotid duplex imaging which showed mild heterogenous plaque with very mild narrowing in the 1-39% range and normal antegrade flow in his vertebrals.   He lives at [facility].  His new primary physician is now Dr. Antony Salmon at [facility] who has checked laboratory.   Since I last saw him in May 2018, he was hospitalized in January 2019 with palpitations and pressure in both sides of his neck.  A nuclear perfusion study was interpreted as abnormal with inferoseptal reversibility.  He underwent repeat cardiac catheterization by Dr. Audelia Acton on 06/13/2017.  He was found to have 95% in-stent restenosis of his RCA stent.  Circumflex stent was patent and there  was occlusion of the OM 2 vessel with good collaterals.  There was concomitant disease in his diagonal and LAD, treated medically.  He was seen by Jory Sims, NP on June 26 2017 in follow-up of his hospitalization.  I last saw him in May 2019 at which time he was doing well and was without recurrent chest pain and denied any recurrence of symptoms since his last intervention.   Since I last  saw him, he was diagnosed with prostate CA and has had bone metastases, urinary retention, and is felt to have a high risk of recurrence due to a Gleason score of 8-10 and significant PSA elevation at 178 initially.  He is followed by Dr. Estill Dooms and is now on hormone blockade with Mills Koller therapy and has required a Foley catheter for urinary retention.  He had ureteral obstruction and stents were placed.  If he continues to be unable to void with hormone blockade he may require TUR P and would need to stop Plavix for this procedure.  He denies any anginal symptoms.  He denies PND orthopnea.  He denies fevers chills or night sweats.  He presents for reevaluation.  Past Medical History:  Diagnosis Date  . Colon cancer ([place]) 1987  . Coronary artery disease    06/13/17 ISR in mRCA with cutting balloon, PCI/DESx1, patent stents in Lcx, with occluded in OM, collaterals to the LAD  . History of bacterial endocarditis 1980, 1990s  . Hyperlipidemia   . OSA on CPAP     Past Surgical History:  Procedure Laterality Date  . APPENDECTOMY    . CARDIAC CATHETERIZATION  08/07/2005   40-50% narrowing in LAD, 30-40% scattered irregularity of diagonal, subtotal-total prox Cfx occlusion w/collaterals of marginal 1, prox 30-40% RCA narrowing, 40-50% distal RCA stenosis (Dr. Corky Downs)  . CORONARY ANGIOPLASTY WITH STENT PLACEMENT  08/15/2005   stent to RCA and prox Cfx with 3x22mm DES to AV groove Cfx and 2.5x13 Cypher DES to marginal (Dr. Corky Downs)  . CORONARY STENT INTERVENTION N/A 06/13/2017   Procedure: CORONARY STENT INTERVENTION;  Surgeon: Wellington Hampshire, MD;  Location: [facility];  Service: Cardiovascular;  Laterality: N/A;  . KNEE SURGERY  2004   right knee  . LEFT HEART CATH AND CORONARY ANGIOGRAPHY N/A 06/13/2017   Procedure: LEFT HEART CATH AND CORONARY ANGIOGRAPHY;  Surgeon: Wellington Hampshire, MD;  Location: [place] [facility];  Service: Cardiovascular;  Laterality: N/A;  . NM MYOCAR PERF WALL  MOTION  02/2012   lexiscan - normal perfusion, EF 66%, low risk   . SUBTOTAL COLECTOMY     in [place] r/t adenocarcinoma of colon   . TRANSTHORACIC ECHOCARDIOGRAM  12/2010   EF=>55%, mild conc LVH; borderline LA enlargement; calcification of anterior MV leaflets, mild MR; mild TR; RVSP 30-73mmHg; mild calcification of AV leaflets and mild-mod AV regurg; mild pulm regurg; aortic root sclerosis/calcification    No Known Allergies  Current Outpatient Medications  Medication Sig Dispense Refill  . amLODipine (NORVASC) 5 MG tablet TAKE 1/2 TABLET DAILY (Patient taking differently: TAKE 2.5 mg TABLET DAILY) 45 tablet 1  . aspirin 81 MG tablet Take 81 mg by mouth daily.    . cetirizine (ZYRTEC) 5 MG tablet Take 5 mg by mouth daily.    . Cholecalciferol (VITAMIN D3) 400 UNITS CAPS Take 1 capsule by mouth daily.    . clopidogrel (PLAVIX) 75 MG tablet TAKE ONE (1) TABLET EACH DAY 90 tablet 3  . clopidogrel (PLAVIX) 75  MG tablet TAKE ONE (1) TABLET EACH DAY 90 tablet 3  . co-enzyme Q-10 30 MG capsule Take 30 mg by mouth daily.    . fish oil-omega-3 fatty acids 1000 MG capsule Take 2 g by mouth daily.    . fluticasone (FLONASE) 50 MCG/ACT nasal spray Place 1 spray into both nostrils daily as needed.    . Glucosamine-Chondroit-Vit C-Mn (GLUCOSAMINE 1500 COMPLEX PO) Take 1 capsule by mouth daily.    . isosorbide mononitrate (IMDUR) 30 MG 24 hr tablet TAKE ONE (1) TABLET EACH DAY (Patient taking differently: TAKE ONE (1) TABLET (30mg ) EACH DAY) 90 tablet 0  . levothyroxine (SYNTHROID, LEVOTHROID) 25 MCG tablet Take 25 mcg by mouth daily before breakfast.    . Misc Natural Products (TURMERIC CURCUMIN) CAPS Take 1 capsule by mouth daily.    . nitroGLYCERIN (NITROSTAT) 0.4 MG SL tablet PLACE 1 TABLET UNDER THE TONGUE EVERY 5 MINUTES AS NEEDED FOR CHEST PAIN 25 tablet 3  . NON FORMULARY CPAP therapy    . saw palmetto 160 MG capsule Take 160 mg by mouth daily.    . tamsulosin (FLOMAX) 0.4 MG CAPS capsule Take  1 capsule (0.4 mg total) by mouth daily. 90 capsule 3  . VYTORIN 10-20 MG tablet TAKE ONE (1) TABLET EACH DAY AT BEDTIME 90 tablet 0   No current facility-administered medications for this visit.     Social History   Socioeconomic History  . Marital status: Married    Spouse name: Not on file  . Number of children: Not on file  . Years of education: Not on file  . Highest education level: Not on file  Occupational History  . Not on file  Social Needs  . Financial resource strain: Not on file  . Food insecurity:    Worry: Not on file    Inability: Not on file  . Transportation needs:    Medical: Not on file    Non-medical: Not on file  Tobacco Use  . Smoking status: Never Smoker  . Smokeless tobacco: Never Used  Substance and Sexual Activity  . Alcohol use: Yes    Alcohol/week: 0.0 standard drinks    Comment: ocassional glass of wine.  . Drug use: No  . Sexual activity: Not on file  Lifestyle  . Physical activity:    Days per week: Not on file    Minutes per session: Not on file  . Stress: Not on file  Relationships  . Social connections:    Talks on phone: Not on file    Gets together: Not on file    Attends religious service: Not on file    Active member of club or organization: Not on file    Attends meetings of clubs or organizations: Not on file    Relationship status: Not on file  . Intimate partner violence:    Fear of current or ex partner: Not on file    Emotionally abused: Not on file    Physically abused: Not on file    Forced sexual activity: Not on file  Other Topics Concern  . Not on file  Social History Narrative  . Not on file   Additional social history is notable that he is originally from [place]. He has 3 children 2 grandchildren. He does exercise. There is no alcohol or tobacco use.  Parents are deceased.  ROS General: Negative; No fevers, chills, or night sweats;  HEENT: Negative; No changes in vision or hearing, sinus congestion,  difficulty  swallowing Pulmonary: Negative; No cough, wheezing, shortness of breath, hemoptysis Cardiovascular: Negative; No chest pain, presyncope, syncope, palpitations GI: Negative; No nausea, vomiting, diarrhea, or abdominal pain GU: Metastatic prostate CA to bone, urinary retention with Foley.  On hormonal therapy Musculoskeletal: Negative; no myalgias, joint pain, or weakness Hematologic/Oncology: Negative; no easy bruising, bleeding Endocrine: Negative; no heat/cold intolerance; no diabetes Neuro: Negative; no changes in balance, headaches Skin: Negative; No rashes or skin lesions Psychiatric: Negative; No behavioral problems, depression Sleep: Positive for obstructive sleep apnea, followed by Dr. Annamaria Boots; No snoring, daytime sleepiness, hypersomnolence, bruxism, restless legs, hypnogognic hallucinations, no cataplexy Other comprehensive 14 point system review is negative.   PE BP (!) 156/72   Pulse (!) 52   Ht 5\' 9"  (1.753 m)   Wt 147 lb (66.7 kg)   BMI 21.71 kg/m    Repeat blood pressure by me was improved at 126/74  Wt Readings from Last 3 Encounters:  05/07/18 147 lb (66.7 kg)  12/26/17 146 lb (66.2 kg)  10/12/17 158 lb 3.2 oz (71.8 kg)   General: Alert, oriented, no distress.  Skin: normal turgor, no rashes, warm and dry HEENT: Normocephalic, atraumatic. Pupils equal round and reactive to light; sclera anicteric; extraocular muscles intact;  Nose without nasal septal hypertrophy Mouth/Parynx benign; Mallinpatti scale 3 Neck: No JVD, no carotid bruits; normal carotid upstroke Lungs: clear to ausculatation and percussion; no wheezing or rales Chest wall: without tenderness to palpitation Heart: PMI not displaced, RRR, s1 s2 normal, 1/6 systolic murmur, no diastolic murmur, no rubs, gallops, thrills, or heaves Abdomen: soft, nontender; no hepatosplenomehaly, BS+; abdominal aorta nontender and not dilated by palpation.  Catheter in place Back: no CVA tenderness Pulses  2+ Musculoskeletal: full range of motion, normal strength, no joint deformities Extremities: no clubbing cyanosis or edema, Homan's sign negative  Neurologic: grossly nonfocal; Cranial nerves grossly wnl Psychologic: Normal mood and affect   ECG (independently read by me): Sinus bradycardia 52 bpm.  Small Q wave in 3.  No ectopy.  Normal intervals.  May 2019 ECG (independently read by me): Normal sinus rhythm at 60 bpm.  PAC.  Normal intervals.  No ST segment changes.  May 2018 ECG (independently read by me): Sinus bradycardia 51 bpm.  Normal intervals.  No ST segment changes.  January 2017 ECG (independently read by me):  Sinus bradycardia 53 bpm.  No significant ST segment changes.  November 2016 ECG (independently read by me): Sinus bradycardia at 48 bpm.  April 2016 ECG (independently read by me): Sinus bradycardia at 51 bpm.  No ectopy.  Normal intervals.  October 2015 ECG (independently read by me): Sinus bradycardia 58 beats per minute.  Normal intervals.  No ST segment changes.  LABS:  BMP Latest Ref Rng & Units 10/31/2017 06/14/2017 06/13/2017  Glucose 65 - 99 mg/dL 90 86 101(H)  BUN 8 - 27 mg/dL 21 15 13   Creatinine 0.76 - 1.27 mg/dL 1.26 1.06 1.09  BUN/Creat Ratio 10 - 24 17 - -  Sodium 134 - 144 mmol/L 142 137 140  Potassium 3.5 - 5.2 mmol/L 4.6 3.8 3.9  Chloride 96 - 106 mmol/L 106 108 111  CO2 20 - 29 mmol/L 24 22 20(L)  Calcium 8.6 - 10.2 mg/dL 9.3 8.9 9.2      Component Value Date/Time   PROT 7.0 10/31/2017 0930   ALBUMIN 4.1 10/31/2017 0930   AST 16 10/31/2017 0930   ALT 9 10/31/2017 0930   ALKPHOS 161 (H) 10/31/2017 0930   BILITOT  0.7 10/31/2017 0930       Component Value Date/Time   WBC 5.9 10/31/2017 0930   WBC 5.5 06/14/2017 0317   RBC 4.24 10/31/2017 0930   RBC 4.49 06/14/2017 0317   HGB 11.7 (L) 10/31/2017 0930   HCT 35.2 (L) 10/31/2017 0930   PLT 123 (L) 10/31/2017 0930   MCV 83 10/31/2017 0930   MCH 27.6 10/31/2017 0930   MCH 27.2 06/14/2017  0317   MCHC 33.2 10/31/2017 0930   MCHC 32.9 06/14/2017 0317   RDW 14.8 10/31/2017 0930   LYMPHSABS 1.7 07/17/2007 1200   MONOABS 0.6 07/17/2007 1200   EOSABS 0.0 07/17/2007 1200   BASOSABS 0.0 07/17/2007 1200     BNP No results found for: PROBNP    Lipid Panel     Component Value Date/Time   CHOL 93 (L) 10/31/2017 0930   TRIG 64 10/31/2017 0930   HDL 37 (L) 10/31/2017 0930   CHOLHDL 2.5 10/31/2017 0930   LDLCALC 43 10/31/2017 0930    RADIOLOGY: No results found.  IMPRESSION:  1. CAD in native artery   2. CAD S/P percutaneous coronary angioplasty and DES stent   3. Essential hypertension   4. Hyperlipidemia with target LDL less than 70   5. OSA on CPAP   6. Sinus bradycardia   7. Prostate cancer metastatic to bone ([place])   8. Urinary retention     ASSESSMENT AND PLAN: Anthony Petty is an 84 year old gentleman who has a remote history of endocarditis x2 initially in 24 and subsequently into early 1990s. He is 13 years status post intervention to his RCA and recanalization of the circumflex system which was noted to be occluded with collateralization from the LAD to the OM prior to intervention. He has a 30x28 mm Cypher stent in the AV groove and 2.5x13 mm Cypher stent in the OM1 ostium. The RCA has a 3.0x23 mm Cypher stent in its midsegment.  He had developed probable sinus node dysfunction leading to discontinuance of beta blocker therapy. He was found to have high-grade in-stent restenosis of his mid RCA stent and underwent successful cutting balloon, and ultimate DES stenting of his mid RCA.  His AV groove circumflex stent was patent with an occluded stent to the marginal vessel which had developed collaterals from the LAD.  He has been without recurrent anginal type symptoms since his intervention.  He has developed metastatic prostate CA to bone resulting in urinary retention and need for Foley catheterization.  He is now on hormonal replacement.  Presently he is  without recurrent anginal symptomatology.  His blood pressure on repeat by me today was normal on amlodipine 2.5 mg.   He is no longer on beta-blocker but continues to have sinus bradycardia.  He continues to be on Vytorin 10/20 and lipid panel in June 2019 was excellent with an LDL of 43.  He has hypothyroidism on levothyroxine.  He continues to be on aspirin and Plavix.  Hopefully he will respond and begin voiding on his own with hormonal therapy.  If a TURP is indicated in the future Plavix will need to be held.  He continues to be on CPAP for obstructive sleep apnea and admits to excellent compliance.  I will see him in 6 months for reevaluation or sooner if problems arise.  Time spent: 25 minutes Anthony Sine, MD, [facility]  05/13/2018 7:20 PM

## 2018-05-07 NOTE — Patient Instructions (Signed)
Medication Instructions:  Your physician recommends that you continue on your current medications as directed. Please refer to the Current Medication list given to you today.  If you need a refill on your cardiac medications before your next appointment, please call your pharmacy.   Follow-Up: At [facility], you and your health needs are our priority.  As part of our continuing mission to provide you with exceptional heart care, we have created designated Provider Care Teams.  These Care Teams include your primary Cardiologist (physician) and Advanced Practice Providers (APPs -  Physician Assistants and Nurse Practitioners) who all work together to provide you with the care you need, when you need it. You will need a follow up appointment in 6 months.  Please call our office 2 months in advance to schedule this appointment.  You may see Dr. Kelly or one of the following Advanced Practice Providers on your designated Care Team: Hao Meng, PA-C . Angela Duke, PA-C  .  

## 2018-05-13 ENCOUNTER — Encounter: Payer: Self-pay | Admitting: Cardiovascular Disease

## 2018-07-04 ENCOUNTER — Ambulatory Visit: Payer: Medicare Other | Admitting: Adult Health

## 2018-09-04 ENCOUNTER — Encounter: Payer: Self-pay | Admitting: Neurology

## 2018-09-04 ENCOUNTER — Telehealth: Payer: Self-pay | Admitting: Neurology

## 2018-09-04 NOTE — Telephone Encounter (Signed)
Called the patient to inform them that our office has placed new protocols in place for our office visits. Due to Covid 19 our office is reducing our number of office visits in order to minimize the risk to our patients and healthcare providers.Our office is now providing the capability to offer the patients virtual visits at this time. The patient does not have access to a camera capable equipment. I offered a phone visit and he was able to do that. Informed of what that process looks like and informed that the phone visit will still be billed through insurance as such. Due to [place],informed the patient since the appointment is taking place over the phone/internet app, we can't guarantee the security of the phone line. With that said if we do move forward I would have to get verbal consent to completed the call over the phone. Patient gave verbal consent to move forward with the phone visit. I have reviewed the patient's chart and made sure that everything is up to date. Patient is also made aware that since this is a phone visit we are able to complete the visit but a physical exam is not able to be done since the patient is not present in person. Pt verbalized understanding of this information and will states to be ready for the visit at least 15 min prior to the visit.   Patient's main reason for this visit is he is due for a cpap visit but also his machine is making a noise and he is due for a new machine. Pt is established with [facility] ([employer]) His current machine is a [employer] operational officer. Advised the patient that if he has the capability to bring the card in for [name] to do a download or take to [facility] to have them read it and send [name] the data that would be great but given his age its fine if he can't. Last sleep study was here 2017. I will also reach out to [facility] to see if they have access to his machine and able to see data. I will also make sure the 2017 sleep study will work for getting him a new machine.

## 2018-09-12 ENCOUNTER — Other Ambulatory Visit: Payer: Self-pay

## 2018-09-12 ENCOUNTER — Encounter: Payer: Self-pay | Admitting: Neurology

## 2018-09-12 ENCOUNTER — Ambulatory Visit (INDEPENDENT_AMBULATORY_CARE_PROVIDER_SITE_OTHER): Payer: Medicare Other | Admitting: Neurology

## 2018-09-12 DIAGNOSIS — R002 Palpitations: Secondary | ICD-10-CM

## 2018-09-12 DIAGNOSIS — C7989 Secondary malignant neoplasm of other specified sites: Secondary | ICD-10-CM | POA: Insufficient documentation

## 2018-09-12 DIAGNOSIS — C679 Malignant neoplasm of bladder, unspecified: Secondary | ICD-10-CM

## 2018-09-12 DIAGNOSIS — I2510 Atherosclerotic heart disease of native coronary artery without angina pectoris: Secondary | ICD-10-CM | POA: Diagnosis not present

## 2018-09-12 DIAGNOSIS — G4733 Obstructive sleep apnea (adult) (pediatric): Secondary | ICD-10-CM

## 2018-09-12 DIAGNOSIS — Z9861 Coronary angioplasty status: Secondary | ICD-10-CM | POA: Diagnosis not present

## 2018-09-12 DIAGNOSIS — Z9989 Dependence on other enabling machines and devices: Secondary | ICD-10-CM

## 2018-09-12 NOTE — Addendum Note (Signed)
Addended by: Larey Seat on: 09/12/2018 03:28 PM   Modules accepted: Orders

## 2018-09-12 NOTE — Progress Notes (Addendum)
Virtual Visit via Telephone Note  I connected with Anthony Petty on 09/12/18 at  2:30 PM EDT by telephone and verified that I am speaking with the correct person using two identifiers.   I discussed the limitations, risks, security and privacy concerns of performing an evaluation and management service by telephone and the availability of in person appointments. I also discussed with the patient that there may be a patient responsible charge related to this service. The patient expressed understanding and agreed to proceed.   Larey Seat, MD    [facility]   Provider:  Larey Seat, MD  Referring Provider: Javier Glazier, MD   Primary Care Physician:  Javier Glazier, MD   Anthony Petty is a 85 y.o. male , was seen her for transfer of apnea care. He is now followed by a virtual phone visit on 09-12-2018, having had most recent appointments with [facility].   The patient was referred originally by Dr. Burnard Bunting at the time his primary care physician and in May 2017 he underwent a split-night polysomnography through our office, diagnosed with an AHI of 27/h supine AHI 37.8, no REM sleep was noted.  Once on CPAP he was titrated to 15 cm water pressure and our recommendation was to use an AutoSet CPAP machine between 10 and 15 cm water pressure.  He had several follow-up since then the last one showing the emergence of central sleep apnea. He is doing well, feels refreshed and restored when using it, 8 hours of sleep, noted a hose leaking air- he fixed this with a sock (!). No daytime sleepiness at tis time.   HPI: Anthony Petty apparently has been a CPAP user for 2 decades and has been compliance with CPAP therapy. He told me today that he has taken his sleep apnea machine on multiple long distance trips and that he has always felt that it was beneficial for him to adhere to the therapy. He has moved to [facility] and is currently looking to  transfer his sleep care to a local sleep specialist. But he finds that his sleep is restorative or refreshing his wife has noted that he sometimes still having apneas in spite of using the CPAP machine at night. He is currently using and nasal pillow and he brought the CPAP machine here today to this visit. His past medical history was described by Dr. 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  He has a cardiac stent, cholecystectomy and colon surgery behind him.  CD, Interval history from 01/17/2016. I have the pleasure of seeing our mutual patient Anthony Petty  Today, with undergone a new titration study on 10/06/2015. His baseline AHI was 27, in supine sleep his apnea increased to 37.8 point per hour of sleep and no REM sleep was noted. He was titrated to 15 cm water but did best at a pressure of 12. In supine his AHI became 0.0 in nonsupine 5.6 and overall averaged at 3.9.  I had the pleasure today to see his compliance and he has been 100% compliance for the last 30 days was over 100% of use of 4 hours or longer each night. Average user time is 8 hours and 31 minutes nightly the mean CPAP pressure  is 10.4 cm but he still has some apneas left.  The minimum pressure is 10 and the maximum pressure 15 cm with 3 cm a flex. The machine measured not longer the presence of obstructive sleep apnea but breakthrough of central apnea.  The average AHI was 23.8/ h based on over 19.7 central apneas.   Sleep habits are as follows:The patient usually goes to bed around 11 PM, and usually is asleep promptly, his bedroom is conducive to sleep, cool, quiet and dark. He is using a CPAP pillow, he is mostly sleeping supine sometimes in the right lateral  position. He wakes up several times at night but doesn't stay  awake very long, it is not usually a bathroom call that wakes him. His wife reports that he sometimes has vivid dreams for example seeing a mouse in the bed.He is often moving - sometimes with jerky movements. The couple usually rises at around 7- 7:30 AM without an alarm. Usually Anthony Petty feels refreshed and restored in the morning, he does not wake up with significant headaches, dizziness, diaphoresis. He likes a nap about 4 times a week, usually less than one hour following lunch..   Sleep medical history - bladder cancer diagnosed in 11-2017, prostate cancer, had survived colo-rectal cancer. Nocturia - no feeling, he uses a cathether.   Family sleep history: acting out dreams, no family history of OSA.   Social history:  Remarried for 32 years, one of 8 children, parents from [place]. Seldomly drinks ETOH, no tobacco use, caffeine : seldom.    Review of Systems: Out of a complete 14 system review, the patient complains of only the following symptoms, and all other reviewed systems are negative. No snoring is noted while the patient is using CPAP, but some breakthrough apneas may still be present. Sometimes acting out a dream or having a vivid dream hallucination,  fatigue severity score endorsed at 16 from 12 points.  geriatric depression score 1 point  How likely are you to doze in the following situations: 0 = not likely, 1 = slight chance, 2 = moderate chance, 3 = high chance  Sitting and Reading? Watching Television? Sitting inactive in a public place (theater or meeting)? Lying down in the afternoon when circumstances permit? Sitting and talking to someone? Sitting quietly after lunch without alcohol? In a car, while stopped for a few minutes in traffic? As a passenger in a car for an hour without a break?  Total = 8/24 points     Easy bruising.  Rare now REM BD- reduced under treatment of apnea.     Social History   Socioeconomic History  . Marital status: Married    Spouse name: Not on file  . Number of children: Not on file  . Years of education: Not on file  . Highest education level: Not on file  Occupational History  . Not on file  Social Needs  . Financial resource strain: Not on file  . Food insecurity:    Worry: Not on file    Inability: Not on file  . Transportation needs:    Medical: Not on file    Non-medical: Not on file  Tobacco Use  . Smoking status: Never Smoker  . Smokeless tobacco: Never Used  Substance and Sexual Activity  . Alcohol use: Yes    Alcohol/week: 0.0 standard drinks    Comment: ocassional glass of wine.  . Drug use: No  . Sexual activity: Not on file  Lifestyle  .  Physical activity:    Days per week: Not on file    Minutes per session: Not on file  . Stress: Not on file  Relationships  . Social connections:    Talks on phone: Not on file    Gets together: Not on file    Attends religious service: Not on file    Active member of club or organization: Not on file    Attends meetings of clubs or organizations: Not on file    Relationship status: Not on file  . Intimate partner violence:    Fear of current or ex partner: Not on file    Emotionally abused: Not on file    Physically abused: Not on file    Forced sexual activity: Not on file  Other Topics Concern  . Not on file  Social History Narrative  . Not on file    No family history on file.  Past Medical History:  Diagnosis Date  . Colon cancer ([place]) 1987   bladder cancer  . Coronary artery disease    06/13/17 ISR in mRCA with cutting balloon, PCI/DESx1, patent stents in Lcx, with occluded in OM, collaterals to the LAD  . History of bacterial endocarditis 1980, 1990s  . Hyperlipidemia   . OSA on CPAP     Past Surgical History:  Procedure Laterality Date  . APPENDECTOMY    . BLADDER SURGERY     for bladder cancer  . CARDIAC CATHETERIZATION  08/07/2005   40-50%  narrowing in LAD, 30-40% scattered irregularity of diagonal, subtotal-total prox Cfx occlusion w/collaterals of marginal 1, prox 30-40% RCA narrowing, 40-50% distal RCA stenosis (Dr. Corky Downs)  . CORONARY ANGIOPLASTY WITH STENT PLACEMENT  08/15/2005   stent to RCA and prox Cfx with 3x76mm DES to AV groove Cfx and 2.5x13 Cypher DES to marginal (Dr. Corky Downs)  . CORONARY STENT INTERVENTION N/A 06/13/2017   Procedure: CORONARY STENT INTERVENTION;  Surgeon: Wellington Hampshire, MD;  Location: [facility];  Service: Cardiovascular;  Laterality: N/A;  . KNEE SURGERY  2004   right knee  . LEFT HEART CATH AND CORONARY ANGIOGRAPHY N/A 06/13/2017   Procedure: LEFT HEART CATH AND CORONARY ANGIOGRAPHY;  Surgeon: Wellington Hampshire, MD;  Location: [facility];  Service: Cardiovascular;  Laterality: N/A;  . NM MYOCAR PERF WALL MOTION  02/2012   lexiscan - normal perfusion, EF 66%, low risk   . PROSTATE SURGERY     for cancer  . SUBTOTAL COLECTOMY     in [place] r/t adenocarcinoma of colon   . TRANSTHORACIC ECHOCARDIOGRAM  12/2010   EF=>55%, mild conc LVH; borderline LA enlargement; calcification of anterior MV leaflets, mild MR; mild TR; RVSP 30-66mmHg; mild calcification of AV leaflets and mild-mod AV regurg; mild pulm regurg; aortic root sclerosis/calcification    Current Outpatient Medications  Medication Sig Dispense Refill  . amLODipine (NORVASC) 5 MG tablet TAKE 1/2 TABLET DAILY (Patient taking differently: TAKE 2.5 mg TABLET DAILY) 45 tablet 1  . aspirin 81 MG tablet Take 81 mg by mouth daily.    . cetirizine (ZYRTEC) 5 MG tablet Take 5 mg by mouth daily.    . Cholecalciferol (VITAMIN D3) 400 UNITS CAPS Take 1 capsule by mouth daily.    . clopidogrel (PLAVIX) 75 MG tablet TAKE ONE (1) TABLET EACH DAY 90 tablet 3  . co-enzyme Q-10 30 MG capsule Take 30 mg by mouth daily.    . fish oil-omega-3 fatty acids 1000 MG capsule Take  2 g by mouth daily.    . fluticasone (FLONASE) 50 MCG/ACT nasal  spray Place 1 spray into both nostrils daily as needed.    . Glucosamine-Chondroit-Vit C-Mn (GLUCOSAMINE 1500 COMPLEX PO) Take 1 capsule by mouth daily.    . isosorbide mononitrate (IMDUR) 30 MG 24 hr tablet TAKE ONE (1) TABLET EACH DAY (Patient taking differently: TAKE ONE (1) TABLET (30mg ) EACH DAY) 90 tablet 0  . levothyroxine (SYNTHROID, LEVOTHROID) 25 MCG tablet Take 25 mcg by mouth daily before breakfast.    . Misc Natural Products (TURMERIC CURCUMIN) CAPS Take 1 capsule by mouth daily.    . nitroGLYCERIN (NITROSTAT) 0.4 MG SL tablet PLACE 1 TABLET UNDER THE TONGUE EVERY 5 MINUTES AS NEEDED FOR CHEST PAIN 25 tablet 3  . NON FORMULARY CPAP therapy    . saw palmetto 160 MG capsule Take 160 mg by mouth daily.    . tamsulosin (FLOMAX) 0.4 MG CAPS capsule Take 1 capsule (0.4 mg total) by mouth daily. 90 capsule 3  . tolterodine (DETROL LA) 4 MG 24 hr capsule Take 4 mg by mouth 2 (two) times daily.    Marland Kitchen VYTORIN 10-20 MG tablet TAKE ONE (1) TABLET EACH DAY AT BEDTIME 90 tablet 0   No current facility-administered medications for this visit.     Allergies as of 09/12/2018  . (No Known Allergies)    Vitals: There were no vitals taken for this visit. Last Weight:  Wt Readings from Last 1 Encounters:  05/07/18 147 lb (66.7 kg)   YTK:PTWSF is no height or weight on file to calculate BMI.     Last Height:   Ht Readings from Last 1 Encounters:  05/07/18 5\' 9"  (1.753 m)    Physical exam:  General: The patient is awake, alert and appears not in acute distress. The patient is well groomed. Head: Normocephalic, atraumatic. Neck is supple. Mallampati 4,  neck circumference :17 . Nasal airflow restricted , TMJ is not evident . Retrognathia is not seen.  Cardiovascular:  Regular rate and rhythm, without  murmurs or carotid bruit, and without distended neck veins. Respiratory: Lungs are clear to auscultation. Skin:  Without evidence of edema, or rash Trunk:  Normal    Neurologic exam : The  patient is awake and alert, oriented to place and time.   Memory subjective  described as intact.   Attention span & concentration ability appears normal.  Speech is fluent,  Without dysarthria, dysphonia or aphasia.  Mood and affect are appropriate.  Cranial nerves: Pupils are equal and briskly reactive to light. Funduscopic exam without  evidence of pallor or edema.  Extraocular movements  in vertical and horizontal planes intact and without nystagmus. Visual fields by finger perimetry are intact. Hearing to finger rub intact. Facial sensation intact to fine touch. Facial motor strength is symmetric and tongue and uvula move midline. Shoulder shrug was symmetrical.   Motor exam: Normal tone, muscle bulk and symmetric strength in all extremities. No cog-wheeling   Sensory:  Fine touch, pinprick and vibration /  Proprioception t was normal. Deep tendon reflexes: in the  upper and lower extremities are symmetric and intact. Babinski maneuver response is  downgoing.  The patient was advised of the nature of the diagnosed sleep disorder , the treatment options and risks for general a health and wellness arising from not treating the condition.  I spent more than 25 minutes of face to face time with the patient. Greater than 50% of time was spent in  counseling and coordination of care. We have discussed the diagnosis and differential and I answered the patient's questions.     Assessment:   1) Anthony Petty has a long-standing history of obstructive sleep apnea - he presented with more Cheyne stokes breathing on a newly issued and titrated CPAP. Between 5-15 cm water in 2019 .  He used for a while a BiPAP - but changed to CPAP- he is now using a 83 year old machine, may need replacement in any case- it leaks air form the tubing. He needs a new machine. [facility] is his DME- [employer]. He would like this repaired or the machine replaced.   HST ordered to document need for OSA treatment.    Follow Up Instructions:  [facility], CPAP- it leaks air form the tubing. He needs a new machine. [facility] is his DME/ [employer].   He would like this repaired or the machine replaced.    I discussed the assessment and treatment plan with the patient. The patient was provided an opportunity to ask questions and all were answered. The patient agreed with the plan and demonstrated an understanding of the instructions.   The patient was advised to call back or seek an in-person evaluation if the symptoms worsen or if the condition fails to improve as anticipated.    I provided 12  minutes of non-face-to-face time during this encounter.  Asencion Partridge Eusebio Blazejewski MD  09/12/2018   CC: Javier Glazier, Md [address] [place], [place] [address]

## 2018-09-12 NOTE — Patient Instructions (Signed)
Replacement of CPAP machine/ broken adapter of the CPAP hose to machine.

## 2018-09-16 ENCOUNTER — Other Ambulatory Visit: Payer: Self-pay | Admitting: Neurology

## 2018-09-16 DIAGNOSIS — Z9989 Dependence on other enabling machines and devices: Principal | ICD-10-CM

## 2018-09-16 DIAGNOSIS — G4733 Obstructive sleep apnea (adult) (pediatric): Secondary | ICD-10-CM

## 2018-09-16 DIAGNOSIS — Z9861 Coronary angioplasty status: Secondary | ICD-10-CM

## 2018-09-16 DIAGNOSIS — I2510 Atherosclerotic heart disease of native coronary artery without angina pectoris: Secondary | ICD-10-CM

## 2018-11-14 ENCOUNTER — Telehealth: Payer: Self-pay | Admitting: Physician Assistant

## 2018-11-14 NOTE — Telephone Encounter (Signed)
call home phone/ consent/ my chart declined/ pre reg completed °

## 2018-11-18 NOTE — Progress Notes (Signed)
Virtual Visit via Telephone Note   This visit type was conducted due to national recommendations for restrictions regarding the COVID-19 Pandemic (e.g. social distancing) in an effort to limit this patient's exposure and mitigate transmission in our community.  Due to his co-morbid illnesses, this patient is at least at moderate risk for complications without adequate follow up.  This format is felt to be most appropriate for this patient at this time.  The patient did not have access to video technology/had technical difficulties with video requiring transitioning to audio format only (telephone).  All issues noted in this document were discussed and addressed.  No physical exam could be performed with this format.  Please refer to the patient's chart for his  consent to telehealth for [facility].   Date:  11/19/2018   ID:  Anthony Petty, DOB 01/18/1928, MRN [id number]  Patient Location: Home Provider Location: Office  PCP:  Javier Glazier, MD  Cardiologist:  Shelva Majestic, MD  Electrophysiologist:  None   Evaluation Performed:  Follow-Up Visit  Chief Complaint:  Follow up  History of Present Illness:    Anthony Petty is a 85 y.o. male with CAD, OSA on CPAP, hyperlipidemia, palpitations, chronic diastolic heart failure, Raynaud's (on norvasc), paroxysmal atrial fibrillation not on anticoagulation, and prostate cancer with bone mets. He has a history of bradycardia thought to be due to development of sinus node dysfunction. He has also had bacterial endocarditis in 1980 and in the 1990s.  He has known total occlusion of the left circumflex and high-grade AV groove circumflex and RCA disease.  He underwent stenting of his RCA and re-cannulization of the totally occluded circumflex and marginal system.  His most recent heart catheterization 06/13/17, obtained after negative myoview, showed 95% in-stent restenosis of his RCA stent, Cx stent was patent and occlusion of OM2 with  collaterals. Diagonal and LAD disease treated medically.   He was last seen by Dr. [name] in [place] on 05/07/18. He unfortunately had been diagnosed with prostate cancer with bone mets.   He presents today for six-month follow-up. He had surgery and is treated with hormones. He has a catheter that has been changed numerous times. He denies chest pain and shortness of breath, but states he is more aware of his heart.  Upon further questioning, he took a nitro tablet once in the past 3 weeks.  He is only taken a total of 3 since his bypass.  We discussed increasing his Imdur, however he prefers to continue his present regimen.  He will call our office if this changes and he starts taking nitro more frequently.  Overall he is doing well from a cardiac standpoint.  The patient does not have symptoms concerning for COVID-19 infection (fever, chills, cough, or new shortness of breath).    Past Medical History:  Diagnosis Date  . Colon cancer ([place]) 1987   bladder cancer  . Coronary artery disease    06/13/17 ISR in mRCA with cutting balloon, PCI/DESx1, patent stents in Lcx, with occluded in OM, collaterals to the LAD  . History of bacterial endocarditis 1980, 1990s  . Hyperlipidemia   . OSA on CPAP    Past Surgical History:  Procedure Laterality Date  . APPENDECTOMY    . BLADDER SURGERY     for bladder cancer  . CARDIAC CATHETERIZATION  08/07/2005   40-50% narrowing in LAD, 30-40% scattered irregularity of diagonal, subtotal-total prox Cfx occlusion w/collaterals of marginal 1, prox 30-40% RCA narrowing, 40-50% distal RCA  stenosis (Dr. Corky Downs)  . CORONARY ANGIOPLASTY WITH STENT PLACEMENT  08/15/2005   stent to RCA and prox Cfx with 3x94mm DES to AV groove Cfx and 2.5x13 Cypher DES to marginal (Dr. Corky Downs)  . CORONARY STENT INTERVENTION N/A 06/13/2017   Procedure: CORONARY STENT INTERVENTION;  Surgeon: Wellington Hampshire, MD;  Location: [place] [facility];  Service: Cardiovascular;  Laterality:  N/A;  . KNEE SURGERY  2004   right knee  . LEFT HEART CATH AND CORONARY ANGIOGRAPHY N/A 06/13/2017   Procedure: LEFT HEART CATH AND CORONARY ANGIOGRAPHY;  Surgeon: Wellington Hampshire, MD;  Location: [place] [facility];  Service: Cardiovascular;  Laterality: N/A;  . NM MYOCAR PERF WALL MOTION  02/2012   lexiscan - normal perfusion, EF 66%, low risk   . PROSTATE SURGERY     for cancer  . SUBTOTAL COLECTOMY     in [place] r/t adenocarcinoma of colon   . TRANSTHORACIC ECHOCARDIOGRAM  12/2010   EF=>55%, mild conc LVH; borderline LA enlargement; calcification of anterior MV leaflets, mild MR; mild TR; RVSP 30-67mmHg; mild calcification of AV leaflets and mild-mod AV regurg; mild pulm regurg; aortic root sclerosis/calcification     Current Meds  Medication Sig  . amLODipine (NORVASC) 5 MG tablet TAKE 1/2 TABLET DAILY (Patient taking differently: TAKE 2.5 mg TABLET DAILY)  . aspirin 81 MG tablet Take 81 mg by mouth daily.  . cetirizine (ZYRTEC) 5 MG tablet Take 5 mg by mouth daily.  . Cholecalciferol (VITAMIN D3) 400 UNITS CAPS Take 1 capsule by mouth daily.  . clopidogrel (PLAVIX) 75 MG tablet TAKE ONE (1) TABLET EACH DAY  . co-enzyme Q-10 30 MG capsule Take 30 mg by mouth daily.  . fish oil-omega-3 fatty acids 1000 MG capsule Take 2 g by mouth daily.  . fluticasone (FLONASE) 50 MCG/ACT nasal spray Place 1 spray into both nostrils daily as needed.  . Glucosamine-Chondroit-Vit C-Mn (GLUCOSAMINE 1500 COMPLEX PO) Take 1 capsule by mouth daily.  . isosorbide mononitrate (IMDUR) 30 MG 24 hr tablet TAKE ONE (1) TABLET EACH DAY (Patient taking differently: TAKE ONE (1) TABLET (30mg ) EACH DAY)  . levothyroxine (SYNTHROID, LEVOTHROID) 25 MCG tablet Take 25 mcg by mouth daily before breakfast.  . Misc Natural Products (TURMERIC CURCUMIN) CAPS Take 1 capsule by mouth daily.  . nitroGLYCERIN (NITROSTAT) 0.4 MG SL tablet PLACE 1 TABLET UNDER THE TONGUE EVERY 5 MINUTES AS NEEDED FOR CHEST PAIN  . NON FORMULARY  CPAP therapy  . saw palmetto 160 MG capsule Take 160 mg by mouth daily.  . tamsulosin (FLOMAX) 0.4 MG CAPS capsule Take 1 capsule (0.4 mg total) by mouth daily.  Marland Kitchen tolterodine (DETROL LA) 4 MG 24 hr capsule Take 4 mg by mouth 2 (two) times daily.  Marland Kitchen VYTORIN 10-20 MG tablet TAKE ONE (1) TABLET EACH DAY AT BEDTIME     Allergies:   Patient has no known allergies.   Social History   Tobacco Use  . Smoking status: Never Smoker  . Smokeless tobacco: Never Used  Substance Use Topics  . Alcohol use: Yes    Alcohol/week: 0.0 standard drinks    Comment: ocassional glass of wine.  . Drug use: No     Family Hx: The patient's family history is not on file.  ROS:   Please see the history of present illness.     All other systems reviewed and are negative.   Prior CV studies:   The following studies were reviewed today:  Left heart cath 06/13/17:  The left ventricular systolic function is normal.  LV end diastolic pressure is normal.  The left ventricular ejection fraction is greater than 65% by visual estimate.  Prox RCA to Mid RCA lesion is 95% stenosed.  Prox Cx to Dist Cx lesion is 20% stenosed.  Ost 2nd Mrg to 2nd Mrg lesion is 100% stenosed.  Prox LAD lesion is 60% stenosed.  Ost 1st Diag to 1st Diag lesion is 75% stenosed.  Ost 2nd Diag to 2nd Diag lesion is 80% stenosed.  A drug-eluting stent was successfully placed using a STENT SYNERGY DES 3X16.  Post intervention, there is a 0% residual stenosis.   1.  Severe two-vessel coronary artery disease.  Severe focal in-stent restenosis in the proximal right coronary artery (only in the proximal portion of the stent).  Patent stents in the left circumflex with occluded stent in OM 2 with well-developed collaterals from the LAD.  Moderate proximal LAD stenosis at the bifurcation of first diagonal. 2.  Hyperdynamic LV systolic function with normal left ventricular end-diastolic pressure. 3.  Successful cutting balloon  angioplasty and drug-eluting stent placement to the proximal right coronary artery for severe in-stent restenosis.  Recommendations: Dual antiplatelet therapy for at least one year.  Aggressive treatment of risk factors.  Treat the rest of coronary artery disease medically.   Labs/Other Tests and Data Reviewed:    EKG:  An ECG dated 05/07/18 was personally reviewed today and demonstrated:  sinus bradycardia HR 52, nonspecific ST changes similar to prior  Recent Labs: No results found for requested labs within last 8760 hours.   Recent Lipid Panel Lab Results  Component Value Date/Time   CHOL 93 (L) 10/31/2017 09:30 AM   TRIG 64 10/31/2017 09:30 AM   HDL 37 (L) 10/31/2017 09:30 AM   CHOLHDL 2.5 10/31/2017 09:30 AM   LDLCALC 43 10/31/2017 09:30 AM    Wt Readings from Last 3 Encounters:  11/19/18 136 lb (61.7 kg)  05/07/18 147 lb (66.7 kg)  12/26/17 146 lb (66.2 kg)     Objective:    Vital Signs:  Ht 5\' 9"  (1.753 m)   Wt 136 lb (61.7 kg)   BMI 20.08 kg/m    VITAL SIGNS:  reviewed GEN:  no acute distress RESPIRATORY:  normal respiratory effort, symmetric expansion NEURO:  alert and oriented x 3, no obvious focal deficit PSYCH:  normal affect  ASSESSMENT & PLAN:     CAD  Aspirin and Plavix, imdur, and amlodipine.  He has taken a nitro tablet once in the past 3 weeks.  We discussed increasing his Imdur; however, he would like to stay on his current regimen for now.  He will call our office if this changes.   Essential hypertension Medications as above.  His pressures have been well controlled.   Hyperlipidemia with LDL goal less than 70 LDL 43 in 2019.   OSA on CPAP   Sinus bradycardia - No beta-blocker   Prostate cancer with metastatic disease He has had surgery for prostate and bladder cancer. He has a catheter that has been changed many times. He is being treated with hormones.   COVID-19 Education: The signs and symptoms of COVID-19 were discussed  with the patient and how to seek care for testing (follow up with PCP or arrange E-visit).  The importance of social distancing was discussed today.  Time:   Today, I have spent 18 minutes with the patient with telehealth technology discussing the above problems.  Medication Adjustments/Labs and Tests Ordered: Current medicines are reviewed at length with the patient today.  Concerns regarding medicines are outlined above.   Tests Ordered: No orders of the defined types were placed in this encounter.   Medication Changes: No orders of the defined types were placed in this encounter.   Follow Up:  Virtual Visit or In Person in 6 month(s)  Signed, Ledora Bottcher, [place]  11/19/2018 11:06 AM    [place]

## 2018-11-19 ENCOUNTER — Telehealth (INDEPENDENT_AMBULATORY_CARE_PROVIDER_SITE_OTHER): Payer: Medicare Other | Admitting: Physician Assistant

## 2018-11-19 ENCOUNTER — Encounter: Payer: Self-pay | Admitting: Physician Assistant

## 2018-11-19 VITALS — Ht 69.0 in | Wt 136.0 lb

## 2018-11-19 DIAGNOSIS — G4733 Obstructive sleep apnea (adult) (pediatric): Secondary | ICD-10-CM | POA: Diagnosis not present

## 2018-11-19 DIAGNOSIS — I10 Essential (primary) hypertension: Secondary | ICD-10-CM

## 2018-11-19 DIAGNOSIS — R001 Bradycardia, unspecified: Secondary | ICD-10-CM

## 2018-11-19 DIAGNOSIS — I2510 Atherosclerotic heart disease of native coronary artery without angina pectoris: Secondary | ICD-10-CM | POA: Diagnosis not present

## 2018-11-19 DIAGNOSIS — Z9989 Dependence on other enabling machines and devices: Secondary | ICD-10-CM

## 2018-11-19 DIAGNOSIS — Z9861 Coronary angioplasty status: Secondary | ICD-10-CM

## 2018-11-19 DIAGNOSIS — E785 Hyperlipidemia, unspecified: Secondary | ICD-10-CM

## 2018-11-27 ENCOUNTER — Other Ambulatory Visit: Payer: Self-pay

## 2018-11-27 ENCOUNTER — Encounter: Payer: Self-pay | Admitting: Adult Health

## 2018-11-27 ENCOUNTER — Ambulatory Visit (INDEPENDENT_AMBULATORY_CARE_PROVIDER_SITE_OTHER): Payer: Medicare Other | Admitting: Adult Health

## 2018-11-27 VITALS — BP 118/67 | HR 62 | Temp 97.5°F | Ht 69.0 in | Wt 143.5 lb

## 2018-11-27 DIAGNOSIS — I2510 Atherosclerotic heart disease of native coronary artery without angina pectoris: Secondary | ICD-10-CM

## 2018-11-27 DIAGNOSIS — Z9989 Dependence on other enabling machines and devices: Secondary | ICD-10-CM

## 2018-11-27 DIAGNOSIS — G4733 Obstructive sleep apnea (adult) (pediatric): Secondary | ICD-10-CM

## 2018-11-27 DIAGNOSIS — Z9861 Coronary angioplasty status: Secondary | ICD-10-CM

## 2018-11-27 NOTE — Progress Notes (Signed)
PATIENT: Anthony Petty DOB: 1927-09-21  REASON FOR VISIT: follow up HISTORY FROM: patient  HISTORY OF PRESENT ILLNESS: Today 11/27/18:  Anthony Petty is an 85 year old male with a history of obstructive sleep apnea on CPAP.  He brought his machine with him today however his machine does not have a chip in it.  We were unable to obtain a wireless download.  We have reached out to his DME company to get a download.  He reports that he continues using the CPAP nightly.  He states that it works well for him.  He states that he did get a new machine but feels that his DME company did not explain how to use it very well.  He denies any significant issues.  He returns today for follow-up.  HISTORY (Copied from Dr.Dohmeier's note) Anthony Petty is a 85 y.o. male , was seen her for transfer of apnea care. He is now followed by a virtual phone visit on 09-12-2018, having had most recent appointments with Np Derwood Becraft.   The patient was referred originally by Dr. Burnard Bunting at the time his primary care physician and in May 2017 he underwent a split-night polysomnography through our office, diagnosed with an AHI of 27/h supine AHI 37.8, no REM sleep was noted.  Once on CPAP he was titrated to 15 cm water pressure and our recommendation was to use an AutoSet CPAP machine between 10 and 15 cm water pressure.  He had several follow-up since then the last one showing the emergence of central sleep apnea. He is doing well, feels refreshed and restored when using it, 8 hours of sleep, noted a hose leaking air- he fixed this with a sock (!). No daytime sleepiness at tis time.   REVIEW OF SYSTEMS: Out of a complete 14 system review of symptoms, the patient complains only of the following symptoms, and all other reviewed systems are negative.  Se HPI  ALLERGIES: No Known Allergies  HOME MEDICATIONS: Outpatient Medications Prior to Visit  Medication Sig Dispense Refill  . amLODipine (NORVASC) 5  MG tablet TAKE 1/2 TABLET DAILY (Patient taking differently: TAKE 2.5 mg TABLET DAILY) 45 tablet 1  . aspirin 81 MG tablet Take 81 mg by mouth daily.    . cetirizine (ZYRTEC) 5 MG tablet Take 5 mg by mouth daily.    . Cholecalciferol (VITAMIN D3) 400 UNITS CAPS Take 1 capsule by mouth daily.    . clopidogrel (PLAVIX) 75 MG tablet TAKE ONE (1) TABLET EACH DAY 90 tablet 3  . co-enzyme Q-10 30 MG capsule Take 30 mg by mouth daily.    . fish oil-omega-3 fatty acids 1000 MG capsule Take 2 g by mouth daily.    . fluticasone (FLONASE) 50 MCG/ACT nasal spray Place 1 spray into both nostrils daily as needed.    . Glucosamine-Chondroit-Vit C-Mn (GLUCOSAMINE 1500 COMPLEX PO) Take 1 capsule by mouth daily.    . isosorbide mononitrate (IMDUR) 30 MG 24 hr tablet TAKE ONE (1) TABLET EACH DAY (Patient taking differently: TAKE ONE (1) TABLET (30mg ) EACH DAY) 90 tablet 0  . levothyroxine (SYNTHROID, LEVOTHROID) 25 MCG tablet Take 25 mcg by mouth daily before breakfast.    . Misc Natural Products (TURMERIC CURCUMIN) CAPS Take 1 capsule by mouth daily.    . nitroGLYCERIN (NITROSTAT) 0.4 MG SL tablet PLACE 1 TABLET UNDER THE TONGUE EVERY 5 MINUTES AS NEEDED FOR CHEST PAIN 25 tablet 3  . NON FORMULARY CPAP therapy    . saw palmetto 160  MG capsule Take 160 mg by mouth daily.    Marland Kitchen tolterodine (DETROL LA) 4 MG 24 hr capsule Take 4 mg by mouth 2 (two) times daily.    Marland Kitchen VYTORIN 10-20 MG tablet TAKE ONE (1) TABLET EACH DAY AT BEDTIME 90 tablet 0  . tamsulosin (FLOMAX) 0.4 MG CAPS capsule Take 1 capsule (0.4 mg total) by mouth daily. 90 capsule 3   No facility-administered medications prior to visit.     PAST MEDICAL HISTORY: Past Medical History:  Diagnosis Date  . Colon cancer ([place]) 1987   bladder cancer  . Coronary artery disease    06/13/17 ISR in mRCA with cutting balloon, PCI/DESx1, patent stents in Lcx, with occluded in OM, collaterals to the LAD  . History of bacterial endocarditis 1980, 1990s  .  Hyperlipidemia   . OSA on CPAP     PAST SURGICAL HISTORY: Past Surgical History:  Procedure Laterality Date  . APPENDECTOMY    . BLADDER SURGERY     for bladder cancer  . CARDIAC CATHETERIZATION  08/07/2005   40-50% narrowing in LAD, 30-40% scattered irregularity of diagonal, subtotal-total prox Cfx occlusion w/collaterals of marginal 1, prox 30-40% RCA narrowing, 40-50% distal RCA stenosis (Dr. Corky Downs)  . CORONARY ANGIOPLASTY WITH STENT PLACEMENT  08/15/2005   stent to RCA and prox Cfx with 3x25mm DES to AV groove Cfx and 2.5x13 Cypher DES to marginal (Dr. Corky Downs)  . CORONARY STENT INTERVENTION N/A 06/13/2017   Procedure: CORONARY STENT INTERVENTION;  Surgeon: Wellington Hampshire, MD;  Location: [place] [facility];  Service: Cardiovascular;  Laterality: N/A;  . KNEE SURGERY  2004   right knee  . LEFT HEART CATH AND CORONARY ANGIOGRAPHY N/A 06/13/2017   Procedure: LEFT HEART CATH AND CORONARY ANGIOGRAPHY;  Surgeon: Wellington Hampshire, MD;  Location: [place] [facility];  Service: Cardiovascular;  Laterality: N/A;  . NM MYOCAR PERF WALL MOTION  02/2012   lexiscan - normal perfusion, EF 66%, low risk   . PROSTATE SURGERY     for cancer  . SUBTOTAL COLECTOMY     in [place] r/t adenocarcinoma of colon   . TRANSTHORACIC ECHOCARDIOGRAM  12/2010   EF=>55%, mild conc LVH; borderline LA enlargement; calcification of anterior MV leaflets, mild MR; mild TR; RVSP 30-4mmHg; mild calcification of AV leaflets and mild-mod AV regurg; mild pulm regurg; aortic root sclerosis/calcification    FAMILY HISTORY: No family history on file.  SOCIAL HISTORY: Social History   Socioeconomic History  . Marital status: Married    Spouse name: Not on file  . Number of children: Not on file  . Years of education: Not on file  . Highest education level: Not on file  Occupational History  . Not on file  Social Needs  . Financial resource strain: Not on file  . Food insecurity    Worry: Not on file     Inability: Not on file  . Transportation needs    Medical: Not on file    Non-medical: Not on file  Tobacco Use  . Smoking status: Never Smoker  . Smokeless tobacco: Never Used  Substance and Sexual Activity  . Alcohol use: Yes    Alcohol/week: 0.0 standard drinks    Comment: ocassional glass of wine.  . Drug use: No  . Sexual activity: Not on file  Lifestyle  . Physical activity    Days per week: Not on file    Minutes per session: Not on file  . Stress: Not  on file  Relationships  . Social Herbalist on phone: Not on file    Gets together: Not on file    Attends religious service: Not on file    Active member of club or organization: Not on file    Attends meetings of clubs or organizations: Not on file    Relationship status: Not on file  . Intimate partner violence    Fear of current or ex partner: Not on file    Emotionally abused: Not on file    Physically abused: Not on file    Forced sexual activity: Not on file  Other Topics Concern  . Not on file  Social History Narrative  . Not on file      PHYSICAL EXAM  Vitals:   11/27/18 1348  BP: 118/67  Pulse: 62  Temp: (!) 97.5 F (36.4 C)  Weight: 143 lb 8 oz (65.1 kg)  Height: 5\' 9"  (1.753 m)   Body mass index is 21.19 kg/m.  Generalized: Well developed, in no acute distress  Chest: Lungs clear to auscultation bilaterally  Neurological examination  Mentation: Alert oriented to time, place, history taking. Follows all commands speech and language fluent Cranial nerve II-XII: Pupils were equal round reactive to light. Extraocular movements were full, visual field were full on confrontational test. Facial sensation and strength were normal. Uvula tongue midline. Head turning and shoulder shrug  were normal and symmetric. Motor: The motor testing reveals 5 over 5 strength of all 4 extremities. Good symmetric motor tone is noted throughout.  Sensory: Sensory testing is intact to soft touch on all 4  extremities. No evidence of extinction is noted.  Coordination: Cerebellar testing reveals good finger-nose-finger and heel-to-shin bilaterally.  Gait and station: Gait is normal.    DIAGNOSTIC DATA (LABS, IMAGING, TESTING) - I reviewed patient records, labs, notes, testing and imaging myself where available.  Lab Results  Component Value Date   WBC 5.9 10/31/2017   HGB 11.7 (L) 10/31/2017   HCT 35.2 (L) 10/31/2017   MCV 83 10/31/2017   PLT 123 (L) 10/31/2017      Component Value Date/Time   NA 142 10/31/2017 0930   K 4.6 10/31/2017 0930   CL 106 10/31/2017 0930   CO2 24 10/31/2017 0930   GLUCOSE 90 10/31/2017 0930   GLUCOSE 86 06/14/2017 0317   BUN 21 10/31/2017 0930   CREATININE 1.26 10/31/2017 0930   CALCIUM 9.3 10/31/2017 0930   PROT 7.0 10/31/2017 0930   ALBUMIN 4.1 10/31/2017 0930   AST 16 10/31/2017 0930   ALT 9 10/31/2017 0930   ALKPHOS 161 (H) 10/31/2017 0930   BILITOT 0.7 10/31/2017 0930   GFRNONAA 51 (L) 10/31/2017 0930   GFRAA 58 (L) 10/31/2017 0930   Lab Results  Component Value Date   CHOL 93 (L) 10/31/2017   HDL 37 (L) 10/31/2017   LDLCALC 43 10/31/2017   TRIG 64 10/31/2017   CHOLHDL 2.5 10/31/2017    Lab Results  Component Value Date   TSH 4.100 10/31/2017      ASSESSMENT AND PLAN 85 y.o. year old male  has a past medical history of Colon cancer ([place]) (1987), Coronary artery disease, History of bacterial endocarditis (1980, 1990s), Hyperlipidemia, and OSA on CPAP. here with:  1.  Obstructive sleep apnea on CPAP  We have reached out to the patient DME company to obtain a download.  The patient is encouraged to continue using the CPAP nightly and greater than 4  hours each night.  We have also sent a message to his DME company asking that they reach out to the patient to review the machine with him again.  I have advised the patient that if symptoms worsen or he develops new symptoms he should let us know.  He will follow-up in 6 months or sooner  if needed.   I spent 15 minutes with the patient. 50% of this time was spent with spent reviewing the CPAP download  Ward Givens, MSN, NP-C 11/27/2018, 2:06 PM [facility] [address] [place], [place] [address] [phone]

## 2018-11-27 NOTE — Patient Instructions (Signed)
Your Plan:  Continue using CPAP nightly   Thank you for coming to see [name] at [facility]. I hope we have been able to provide you high quality care today.  You may receive a patient satisfaction survey over the next few weeks. We would appreciate your feedback and comments so that we may continue to improve ourselves and the health of our patients.

## 2018-11-28 ENCOUNTER — Telehealth: Payer: Self-pay

## 2018-11-28 NOTE — Telephone Encounter (Signed)
Spoke with Kenney Houseman from [employer] to let her know that the patient doesn't feel like his adequately trained on how to use his cpap machine. She stated that she would have [place] Res[facility] Therapist to show him how to use his cpap machine.

## 2018-12-02 NOTE — Telephone Encounter (Signed)
That's fine

## 2018-12-02 NOTE — Telephone Encounter (Signed)
I called [facility].  Pt has not been a pt of there for several yrs now.  I sent CM to [employer] to address.

## 2018-12-02 NOTE — Telephone Encounter (Signed)
Stenson, Kristine Garbe, RN; Eldridge, Star; Elon Alas (this received from [place] for pt) Thank you [place]. I'm including Sonia Baller so she can route to the appropriate team.

## 2018-12-02 NOTE — Telephone Encounter (Signed)
Patient called and states she does not want to use [facility] For his DME he wants to use [employer] he lives in [place] and he has one right down the street form him . Thanks Hinton Dyer

## 2018-12-02 NOTE — Telephone Encounter (Signed)
LMVM for pt did send message to [facility] relating to cpap.  They did respond and let me know that they did get message and will reach out to you at some point.

## 2018-12-09 ENCOUNTER — Other Ambulatory Visit: Payer: Self-pay | Admitting: Cardiovascular Disease

## 2019-09-15 ENCOUNTER — Other Ambulatory Visit: Payer: Self-pay | Admitting: Cardiovascular Disease

## 2019-11-21 ENCOUNTER — Telehealth: Payer: Self-pay | Admitting: Cardiovascular Disease

## 2019-11-21 NOTE — Telephone Encounter (Signed)
Spoke with pt, he reports this morning after waking he developed a pain in his right chest. It lasted about 30 minutes and he took a NTG and the pain eased. He is currently pain free. He has noticed this pain for the last 6-7 days usually in the early morning or late evening. He is active during the day and walks with his wife and has no symptoms while walking. He did not have SOB or radiation. He does feel this is similar to his previous when he had stents. He was given a follow up next week with app and patient voiced understanding to go to the ER for any unrelieved pain. NTG use questions answered. Pt agreed with this plan and will call back with problems or if symptoms change.

## 2019-11-21 NOTE — Telephone Encounter (Signed)
Pt c/o of Chest Pain: STAT if CP now or developed within 24 hours  1. Are you having CP right now? No  2. Are you experiencing any other symptoms (ex. SOB, nausea, vomiting, sweating)? No  3. How long have you been experiencing CP? A week  4. Is your CP continuous or coming and going? Coming and going but consistant  5. Have you taken Nitroglycerin? Yes ?

## 2019-11-27 ENCOUNTER — Ambulatory Visit: Payer: Medicare Other | Admitting: Physician Assistant

## 2019-12-03 ENCOUNTER — Ambulatory Visit: Payer: Medicare Other | Admitting: Adult Health

## 2019-12-25 ENCOUNTER — Ambulatory Visit (INDEPENDENT_AMBULATORY_CARE_PROVIDER_SITE_OTHER): Payer: Medicare Other | Admitting: Cardiovascular Disease

## 2019-12-25 ENCOUNTER — Other Ambulatory Visit: Payer: Self-pay

## 2019-12-25 ENCOUNTER — Encounter: Payer: Self-pay | Admitting: Cardiovascular Disease

## 2019-12-25 VITALS — BP 110/64 | HR 64 | Ht 68.0 in | Wt 142.0 lb

## 2019-12-25 DIAGNOSIS — Z9861 Coronary angioplasty status: Secondary | ICD-10-CM

## 2019-12-25 DIAGNOSIS — Z9989 Dependence on other enabling machines and devices: Secondary | ICD-10-CM

## 2019-12-25 DIAGNOSIS — G4733 Obstructive sleep apnea (adult) (pediatric): Secondary | ICD-10-CM | POA: Diagnosis not present

## 2019-12-25 DIAGNOSIS — E785 Hyperlipidemia, unspecified: Secondary | ICD-10-CM

## 2019-12-25 DIAGNOSIS — I10 Essential (primary) hypertension: Secondary | ICD-10-CM | POA: Diagnosis not present

## 2019-12-25 DIAGNOSIS — C61 Malignant neoplasm of prostate: Secondary | ICD-10-CM

## 2019-12-25 DIAGNOSIS — I2510 Atherosclerotic heart disease of native coronary artery without angina pectoris: Secondary | ICD-10-CM

## 2019-12-25 DIAGNOSIS — C7951 Secondary malignant neoplasm of bone: Secondary | ICD-10-CM

## 2019-12-25 NOTE — Patient Instructions (Signed)
Medication Instructions:  CONTINUE WITH CURRENT MEDICATIONS. NO CHANGES.  *If you need a refill on your cardiac medications before your next appointment, please call your pharmacy*   Follow-Up: At [facility], you and your health needs are our priority.  As part of our continuing mission to provide you with exceptional heart care, we have created designated Provider Care Teams.  These Care Teams include your primary Cardiologist (physician) and Advanced Practice Providers (APPs -  Physician Assistants and Nurse Practitioners) who all work together to provide you with the care you need, when you need it.  We recommend signing up for the patient portal called "MyChart".  Sign up information is provided on this After Visit Summary.  MyChart is used to connect with patients for Virtual Visits (Telemedicine).  Patients are able to view lab/test results, encounter notes, upcoming appointments, etc.  Non-urgent messages can be sent to your provider as well.   To learn more about what you can do with MyChart, go to [email or web address].    Your next appointment:   9 month(s)  The format for your next appointment:   In Person  Provider:   Shelva Majestic, MD

## 2019-12-25 NOTE — Progress Notes (Signed)
Patient ID: Anthony Petty, male   DOB: 10-Aug-1927, 86 y.o.   MRN: [id number]   Primary M.D.: Dr. Antony Salmon, [facility]  HPI: Anthony Petty is a 86 y.o. male who presents for a 20 month follow-up cardiology evaluation with me.   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 He has known CAD and cardiac catheterization in 2007 showed total occlusion of the circumflex vessel as well as high-grade AV groove circumflex and RCA disease for which he underwent stenting of his RCA and recanalization of a totally occluded circumflex and marginal system. Additional problems include paroxysmal atrial fibrillation. An echo Doppler study in August 2012 showed mild LVH with normal systolic function and grade 1 diastolic dysfunction. He had mild aortic sclerosis with mild-to-moderate aortic insufficiency, mild MR, mild TR and PR. There is mild pulmonary hypertension with an estimated PA pressure 35 mm. His last stress test was in October 2013 prior to a trip to [place] when he had experienced some vague symptoms of chest pain. The nuclear perfusion study was unchanged and continued to show fairly normal perfusion. He also has a history of obstructive sleep apnea for which he sees Anthony Petty.  He has had issues of occasional palpitations, weakness and fatigue. His beta blocker dose was reduced from Lopressor 12.5 twice a day to 12.5 mg daily and last year had a CardioNet monitor.  CardioNet monitor demonstrated a 4 beat episode of PSVT. He also complained of "tingling "in the left upper chest region with inspiration.  He underwent a follow-up nuclear study which continued to reveal normal perfusion without scar or ischemia.   When I saw him in January 2015 complained that his fingers turning white particularly with cold weather on a routine basis. He has to wear gloves the entire winter season and at times there may be transient red and blue discoloration.   I was concerned about the possibility of Raynaud's versus digital artery vasospasm and elected to start him on low-dose amlodipine at 2.5 mg which improved some of his symptomatology.  He has developed bradycardia in the past which was felt to be due to development of sinus node dysfunction.  He also was found to have an elevated PSA and underwent urologic evaluation.    He underwent an echo Doppler study on 01/17/2016.  This showed an EF of 60-65%.  He had grade 1 diastolic dysfunction.  There was mild aortic insufficiency.  There was mitral annular calcification.  He was seen by Anthony Petty in February 2018 with complaints of some mild chest pain and exertional fatigue.  He underwent a nuclear perfusion study on 08/25/2016 which was normal.  Ejection fraction was 61%.  There were no ECG changes.  There was no evidence for scar or ischemia.  He also underwent carotid duplex imaging which showed mild heterogenous plaque with very mild narrowing in the 1-39% range and normal antegrade flow in his vertebrals.   He lives at [facility].  His new primary physician is now Dr. Antony Salmon at [facility] who has checked laboratory.   He was hospitalized in January 2019 with palpitations and pressure in both sides of his neck.  A nuclear perfusion study was interpreted as abnormal with inferoseptal reversibility.  He underwent repeat cardiac catheterization by Dr. Audelia Petty on 06/13/2017.  He was found to have 95% in-stent restenosis of his RCA stent.  Circumflex stent was patent and there was occlusion of the OM 2 vessel with  good collaterals.  There was concomitant disease in his diagonal and LAD, treated medically.  He was seen by Anthony Sims, NP on June 26 2017 in follow-up of his hospitalization.  I saw him in May 2019 at which time he was doing well and was without recurrent chest pain and denied any recurrence of symptoms since his last intervention.   Since I last saw him, he was diagnosed with  prostate CA and has had bone metastases, urinary retention, and is felt to have a high risk of recurrence due to a Gleason score of 8-10 and significant PSA elevation at 178 initially.  He is followed by Anthony Petty and is now on hormone blockade with Anthony Petty therapy and has required a Foley catheter for urinary retention.  He had ureteral obstruction and stents were placed.  If he continues to be unable to void with hormone blockade he may require TUR P and would need to stop Plavix for this procedure.  He denies any anginal symptoms.  He denies PND orthopnea.  He denies fevers chills or night sweats.    Since his last evaluation with me in December 2019, overall he has felt well.  Approximately 2 months ago he did experience vague nonexertional chest discomfort with ultimately resolved.  He is followed by Anthony Petty at [place] and where he resides.  He is undergoing hormonal therapy for his prostate cancer.  He continues to use CPAP followed by Anthony Petty and admits to 100% compliance.  He denies any anginal symptoms PND orthopnea.  He is now on low-dose levothyroxine.  He continues to be on DAPT.  He presents for evaluation.  Past Medical History:  Diagnosis Date  . Atypical angina ([place]) 06/02/2013   Pt has chest pain that sounds atypical but he also has exertional fatigue that sounds similar to his presentation in 2007.  Marland Kitchen CAD S/P percutaneous coronary angioplasty 06/27/2007   RCA and CFX DES 2007 Myoview normal 2015   . Colon cancer ([place]) 1987   bladder cancer  . Coronary artery disease    06/13/17 ISR in mRCA with cutting balloon, PCI/DESx1, patent stents in Lcx, with occluded in OM, collaterals to the LAD  . Heart palpitations 05/16/2013  . History of bacterial endocarditis 1980, 1990s  . Hyperlipidemia   . NSTEMI (non-ST elevated myocardial infarction) ([place]) 06/11/2017  . OSA on CPAP   . Raynaud's disease 06/10/2013    Past Surgical History:  Procedure Laterality Date  . APPENDECTOMY     . BLADDER SURGERY     for bladder cancer  . CARDIAC CATHETERIZATION  08/07/2005   40-50% narrowing in LAD, 30-40% scattered irregularity of diagonal, subtotal-total prox Cfx occlusion w/collaterals of marginal 1, prox 30-40% RCA narrowing, 40-50% distal RCA stenosis (Dr. Corky Downs)  . CORONARY ANGIOPLASTY WITH STENT PLACEMENT  08/15/2005   stent to RCA and prox Cfx with 3x42mm DES to AV groove Cfx and 2.5x13 Cypher DES to marginal (Dr. Corky Downs)  . CORONARY STENT INTERVENTION N/A 06/13/2017   Procedure: CORONARY STENT INTERVENTION;  Surgeon: Wellington Hampshire, MD;  Location: [facility];  Service: Cardiovascular;  Laterality: N/A;  . KNEE SURGERY  2004   right knee  . LEFT HEART CATH AND CORONARY ANGIOGRAPHY N/A 06/13/2017   Procedure: LEFT HEART CATH AND CORONARY ANGIOGRAPHY;  Surgeon: Wellington Hampshire, MD;  Location: [facility];  Service: Cardiovascular;  Laterality: N/A;  . NM MYOCAR PERF WALL MOTION  02/2012   lexiscan -  normal perfusion, EF 66%, low risk   . PROSTATE SURGERY     for cancer  . SUBTOTAL COLECTOMY     in [place] r/t adenocarcinoma of colon   . TRANSTHORACIC ECHOCARDIOGRAM  12/2010   EF=>55%, mild conc LVH; borderline LA enlargement; calcification of anterior MV leaflets, mild MR; mild TR; RVSP 30-4mmHg; mild calcification of AV leaflets and mild-mod AV regurg; mild pulm regurg; aortic root sclerosis/calcification    No Known Allergies  Current Outpatient Medications  Medication Sig Dispense Refill  . amLODipine (NORVASC) 5 MG tablet TAKE 1/2 TABLET DAILY (Patient taking differently: TAKE 2.5 mg TABLET DAILY) 45 tablet 1  . aspirin 81 MG tablet Take 81 mg by mouth daily.    . cetirizine (ZYRTEC) 5 MG tablet Take 5 mg by mouth daily.    . Cholecalciferol (VITAMIN D3) 400 UNITS CAPS Take 1 capsule by mouth daily.    . clopidogrel (PLAVIX) 75 MG tablet TAKE 1 TABLET BY MOUTH DAILY 90 tablet 0  . co-enzyme Q-10 30 MG capsule Take 30 mg by mouth daily.    .  ferrous sulfate 325 (65 FE) MG tablet Take 1 tablet by mouth daily.    . fish oil-omega-3 fatty acids 1000 MG capsule Take 2 g by mouth daily.    . Glucosamine-Chondroit-Vit C-Mn (GLUCOSAMINE 1500 COMPLEX PO) Take 1 capsule by mouth daily.    . isosorbide mononitrate (IMDUR) 30 MG 24 hr tablet TAKE ONE (1) TABLET EACH DAY (Patient taking differently: TAKE ONE (1) TABLET (30mg ) EACH DAY) 90 tablet 0  . levothyroxine (SYNTHROID, LEVOTHROID) 25 MCG tablet Take 25 mcg by mouth daily before breakfast.    . Misc Natural Products (TURMERIC CURCUMIN) CAPS Take 1 capsule by mouth daily.    . nitroGLYCERIN (NITROSTAT) 0.4 MG SL tablet PLACE 1 TABLET UNDER THE TONGUE EVERY 5 MINUTES AS NEEDED FOR CHEST PAIN 25 tablet 3  . NON FORMULARY CPAP therapy    . saw palmetto 160 MG capsule Take 160 mg by mouth daily.    Marland Kitchen tolterodine (DETROL LA) 4 MG 24 hr capsule Take 4 mg by mouth 2 (two) times daily.    Marland Kitchen VYTORIN 10-20 MG tablet TAKE ONE (1) TABLET EACH DAY AT BEDTIME 90 tablet 0  . valACYclovir (VALTREX) 500 MG tablet Take 500 mg by mouth 2 (two) times daily.     No current facility-administered medications for this visit.    Social History   Socioeconomic History  . Marital status: Married    Spouse name: Not on file  . Number of children: Not on file  . Years of education: Not on file  . Highest education level: Not on file  Occupational History  . Not on file  Tobacco Use  . Smoking status: Never Smoker  . Smokeless tobacco: Never Used  Vaping Use  . Vaping Use: Never used  Substance and Sexual Activity  . Alcohol use: Yes    Alcohol/week: 0.0 standard drinks    Comment: ocassional glass of wine.  . Drug use: No  . Sexual activity: Not on file  Other Topics Concern  . Not on file  Social History Narrative  . Not on file   Social Determinants of Health   Financial Resource Strain:   . Difficulty of Paying Living Expenses:   Food Insecurity:   . Worried About Charity fundraiser in  the Last Year:   . Arboriculturist in the Last Year:   Transportation Needs:   .  Lack of Transportation (Medical):   Marland Kitchen Lack of Transportation (Non-Medical):   Physical Activity:   . Days of Exercise per Week:   . Minutes of Exercise per Session:   Stress:   . Feeling of Stress :   Social Connections:   . Frequency of Communication with Friends and Family:   . Frequency of Social Gatherings with Friends and Family:   . Attends Religious Services:   . Active Member of Clubs or Organizations:   . Attends Archivist Meetings:   Marland Kitchen Marital Status:   Intimate Partner Violence:   . Fear of Current or Ex-Partner:   . Emotionally Abused:   Marland Kitchen Physically Abused:   . Sexually Abused:    Additional social history is notable that he is originally from [place]. He has 3 children 2 grandchildren. He does exercise. There is no alcohol or tobacco use.  Parents are deceased.  ROS General: Negative; No fevers, chills, or night sweats;  HEENT: Negative; No changes in vision or hearing, sinus congestion, difficulty swallowing Pulmonary: Negative; No cough, wheezing, shortness of breath, hemoptysis Cardiovascular: Negative; No chest pain, presyncope, syncope, palpitations GI: Negative; No nausea, vomiting, diarrhea, or abdominal pain GU: Metastatic prostate CA to bone, urinary retention with Foley.  On hormonal therapy Musculoskeletal: Negative; no myalgias, joint pain, or weakness Hematologic/Oncology: On hormonal therapy for his prostate cancer; no easy bruising, bleeding Endocrine: Negative; no heat/cold intolerance; no diabetes Neuro: Negative; no changes in balance, headaches Skin: Negative; No rashes or skin lesions Psychiatric: Negative; No behavioral problems, depression Sleep: Positive for obstructive sleep apnea, followed by Dr. Annamaria Boots; No snoring, daytime sleepiness, hypersomnolence, bruxism, restless legs, hypnogognic hallucinations, no cataplexy Other comprehensive 14 point  system review is negative.   PE BP (!) 110/64 (BP Location: Left Arm, Patient Position: Sitting, Cuff Size: Normal)   Pulse 64   Ht 5\' 8"  (1.727 m)   Wt 142 lb (64.4 kg)   BMI 21.59 kg/m    Repeat blood pressure by me was 128/64  Wt Readings from Last 3 Encounters:  12/25/19 142 lb (64.4 kg)  11/27/18 143 lb 8 oz (65.1 kg)  11/19/18 136 lb (61.7 kg)   General: Alert, oriented, no distress.  Skin: normal turgor, no rashes, warm and dry HEENT: Normocephalic, atraumatic. Pupils equal round and reactive to light; sclera anicteric; extraocular muscles intact; Nose without nasal septal hypertrophy Mouth/Parynx benign; Mallinpatti scale 3 Neck: No JVD, no carotid bruits; normal carotid upstroke Lungs: clear to ausculatation and percussion; no wheezing or rales Chest wall: without tenderness to palpitation Heart: PMI not displaced, RRR, s1 s2 normal, 1/6 systolic murmur, no diastolic murmur, no rubs, gallops, thrills, or heaves Abdomen: soft, nontender; no hepatosplenomehaly, BS+; abdominal aorta nontender and not dilated by palpation. Back: no CVA tenderness Pulses 2+ Musculoskeletal: full range of motion, normal strength, no joint deformities Extremities: no clubbing cyanosis or edema, Homan's sign negative  Neurologic: grossly nonfocal; Cranial nerves grossly wnl Psychologic: Normal mood and affect   ECG (independently read by me):NSR at 64; no ectopy; normal intervals  December 2019 ECG (independently read by me): Sinus bradycardia 52 bpm.  Small Q wave in 3.  No ectopy.  Normal intervals.  May 2019 ECG (independently read by me): Normal sinus rhythm at 60 bpm.  PAC.  Normal intervals.  No ST segment changes.  May 2018 ECG (independently read by me): Sinus bradycardia 51 bpm.  Normal intervals.  No ST segment changes.  January 2017 ECG (independently read by me):  Sinus bradycardia 53 bpm.  No significant ST segment changes.  November 2016 ECG (independently read by me):  Sinus bradycardia at 48 bpm.  April 2016 ECG (independently read by me): Sinus bradycardia at 51 bpm.  No ectopy.  Normal intervals.  October 2015 ECG (independently read by me): Sinus bradycardia 58 beats per minute.  Normal intervals.  No ST segment changes.  LABS:  BMP Latest Ref Rng & Units 10/31/2017 06/14/2017 06/13/2017  Glucose 65 - 99 mg/dL 90 86 101(H)  BUN 8 - 27 mg/dL 21 15 13   Creatinine 0.76 - 1.27 mg/dL 1.26 1.06 1.09  BUN/Creat Ratio 10 - 24 17 - -  Sodium 134 - 144 mmol/L 142 137 140  Potassium 3.5 - 5.2 mmol/L 4.6 3.8 3.9  Chloride 96 - 106 mmol/L 106 108 111  CO2 20 - 29 mmol/L 24 22 20(L)  Calcium 8.6 - 10.2 mg/dL 9.3 8.9 9.2      Component Value Date/Time   PROT 7.0 10/31/2017 0930   ALBUMIN 4.1 10/31/2017 0930   AST 16 10/31/2017 0930   ALT 9 10/31/2017 0930   ALKPHOS 161 (H) 10/31/2017 0930   BILITOT 0.7 10/31/2017 0930       Component Value Date/Time   WBC 5.9 10/31/2017 0930   WBC 5.5 06/14/2017 0317   RBC 4.24 10/31/2017 0930   RBC 4.49 06/14/2017 0317   HGB 11.7 (L) 10/31/2017 0930   HCT 35.2 (L) 10/31/2017 0930   PLT 123 (L) 10/31/2017 0930   MCV 83 10/31/2017 0930   MCH 27.6 10/31/2017 0930   MCH 27.2 06/14/2017 0317   MCHC 33.2 10/31/2017 0930   MCHC 32.9 06/14/2017 0317   RDW 14.8 10/31/2017 0930   LYMPHSABS 1.7 07/17/2007 1200   MONOABS 0.6 07/17/2007 1200   EOSABS 0.0 07/17/2007 1200   BASOSABS 0.0 07/17/2007 1200     BNP No results found for: PROBNP    Lipid Panel     Component Value Date/Time   CHOL 93 (L) 10/31/2017 0930   TRIG 64 10/31/2017 0930   HDL 37 (L) 10/31/2017 0930   CHOLHDL 2.5 10/31/2017 0930   LDLCALC 43 10/31/2017 0930    RADIOLOGY: No results found.  IMPRESSION:  1. CAD S/P percutaneous coronary angioplasty   2. Essential hypertension   3. Hyperlipidemia with target LDL less than 70   4. OSA on CPAP   5. Prostate cancer metastatic to bone [facility])     ASSESSMENT AND PLAN: Anthony Petty is a  86 year old gentleman who has a remote history of endocarditis x2 initially in1980 and subsequently in the early 1990s. He is 13 years status post intervention to his RCA and recanalization of the circumflex system which was noted to be occluded with collateralization from the LAD to the OM prior to intervention in 2007. He has a 30x28 mm Cypher stent in the AV groove and 2.5x13 mm Cypher stent in the OM1 ostium. The RCA has a 3.0x23 mm Cypher stent in its midsegment.  He had developed probable sinus node dysfunction leading to discontinuance of beta blocker therapy.  In January 2019 he was found to have high-grade in-stent restenosis of his mid RCA stent and underwent successful cutting balloon, and ultimate DES stenting of his mid RCA.  His AV groove circumflex stent was patent with an occluded stent to the marginal vessel which had developed collaterals from the LAD.  Since his last evaluation with me, he apparently underwent repeat cardiac catheterization in January 2019 done by Dr.  Arida and had significant focal in-stent restenosis in the proximal portion of the previously placed stent.  He had patent stents in the circumflex with the old occluded OM 2 with well-developed collaterals from the LAD.  He underwent successful cutting balloon angioplasty and drug-eluting stent placement to the proximal RCA for his in-stent restenosis.  Over the last several years, he has been without recurrent anginal symptomatology and his blood pressure has been controlled on his current regimen of amlodipine 2.5 mg daily, isosorbide 30 mg, and he continues to be on DAPT without/Plavix.  He developed metastatic prostate CA to bone with urinary retention and remotely had required Foley catheterization.  He is on hormonal replacement.  He continues to be on Vytorin 10/20 for hyperlipidemia.  He is on levothyroxine 25 mcg for hypothyroidism.  He is followed by Anthony Petty for sleep apnea and continues to use CPAP with excellent  compliance.    Anthony Sine, MD, [facility]  12/29/2019 9:49 PM

## 2019-12-29 ENCOUNTER — Encounter: Payer: Self-pay | Admitting: Cardiovascular Disease

## 2020-01-12 ENCOUNTER — Encounter (INDEPENDENT_AMBULATORY_CARE_PROVIDER_SITE_OTHER): Payer: Self-pay | Admitting: Adult Health

## 2020-01-12 ENCOUNTER — Encounter: Payer: Self-pay | Admitting: Adult Health

## 2020-01-12 ENCOUNTER — Telehealth: Payer: Self-pay | Admitting: Adult Health

## 2020-01-12 DIAGNOSIS — Z0289 Encounter for other administrative examinations: Secondary | ICD-10-CM

## 2020-01-12 NOTE — Progress Notes (Deleted)
PATIENT: Anthony Petty DOB: [birth year]  REASON FOR VISIT: follow up HISTORY FROM: patient  HISTORY OF PRESENT ILLNESS: Today 01/12/20  HISTORY 11/27/18:  Mr.Anthony Petty is an 86 year old male with a history of obstructive sleep apnea on CPAP.  He brought his machine with him today however his machine does not have a chip in it.  We were unable to obtain a wireless download.  We have reached out to his DME company to get a download.  He reports that he continues using the CPAP nightly.  He states that it works well for him.  He states that he did get a new machine but feels that his DME company did not explain how to use it very well.  He denies any significant issues.  He returns today for follow-up.  REVIEW OF SYSTEMS: Out of a complete 14 system review of symptoms, the patient complains only of the following symptoms, and all other reviewed systems are negative.  FSS ESS  ALLERGIES: No Known Allergies  HOME MEDICATIONS: Outpatient Medications Prior to Visit  Medication Sig Dispense Refill   amLODipine (NORVASC) 5 MG tablet TAKE 1/2 TABLET DAILY (Patient taking differently: TAKE 2.5 mg TABLET DAILY) 45 tablet 1   aspirin 81 MG tablet Take 81 mg by mouth daily.     cetirizine (ZYRTEC) 5 MG tablet Take 5 mg by mouth daily.     Cholecalciferol (VITAMIN D3) 400 UNITS CAPS Take 1 capsule by mouth daily.     clopidogrel (PLAVIX) 75 MG tablet TAKE 1 TABLET BY MOUTH DAILY 90 tablet 0   co-enzyme Q-10 30 MG capsule Take 30 mg by mouth daily.     ferrous sulfate 325 (65 FE) MG tablet Take 1 tablet by mouth daily.     fish oil-omega-3 fatty acids 1000 MG capsule Take 2 g by mouth daily.     Glucosamine-Chondroit-Vit C-Mn (GLUCOSAMINE 1500 COMPLEX PO) Take 1 capsule by mouth daily.     isosorbide mononitrate (IMDUR) 30 MG 24 hr tablet TAKE ONE (1) TABLET EACH DAY (Patient taking differently: TAKE ONE (1) TABLET (30mg ) EACH DAY) 90 tablet 0   levothyroxine (SYNTHROID,  LEVOTHROID) 25 MCG tablet Take 25 mcg by mouth daily before breakfast.     Misc Natural Products (TURMERIC CURCUMIN) CAPS Take 1 capsule by mouth daily.     nitroGLYCERIN (NITROSTAT) 0.4 MG SL tablet PLACE 1 TABLET UNDER THE TONGUE EVERY 5 MINUTES AS NEEDED FOR CHEST PAIN 25 tablet 3   NON FORMULARY CPAP therapy     saw palmetto 160 MG capsule Take 160 mg by mouth daily.     tolterodine (DETROL LA) 4 MG 24 hr capsule Take 4 mg by mouth 2 (two) times daily.     valACYclovir (VALTREX) 500 MG tablet Take 500 mg by mouth 2 (two) times daily.     VYTORIN 10-20 MG tablet TAKE ONE (1) TABLET EACH DAY AT BEDTIME 90 tablet 0   No facility-administered medications prior to visit.    PAST MEDICAL HISTORY: Past Medical History:  Diagnosis Date   Atypical angina ([place]) 06/02/2013   Pt has chest pain that sounds atypical but he also has exertional fatigue that sounds similar to his presentation in 2007.   CAD S/P percutaneous coronary angioplasty 06/27/2007   RCA and CFX DES 2007 Myoview normal 2015    Colon cancer [facility]) 1987   bladder cancer   Coronary artery disease    06/13/17 ISR in mRCA with cutting balloon, PCI/DESx1, patent stents in Lcx,  with occluded in OM, collaterals to the LAD   Heart palpitations 05/16/2013   History of bacterial endocarditis 1980, 1990s   Hyperlipidemia    NSTEMI (non-ST elevated myocardial infarction) ([place]) 06/11/2017   OSA on CPAP    Raynaud's disease 06/10/2013    PAST SURGICAL HISTORY: Past Surgical History:  Procedure Laterality Date   APPENDECTOMY     BLADDER SURGERY     for bladder cancer   CARDIAC CATHETERIZATION  08/07/2005   40-50% narrowing in LAD, 30-40% scattered irregularity of diagonal, subtotal-total prox Cfx occlusion w/collaterals of marginal 1, prox 30-40% RCA narrowing, 40-50% distal RCA stenosis (Dr. Corky Downs)   CORONARY ANGIOPLASTY WITH STENT PLACEMENT  08/15/2005   stent to RCA and prox Cfx with 3x58mm DES to AV groove Cfx  and 2.5x13 Cypher DES to marginal (Dr. Corky Downs)   CORONARY STENT INTERVENTION N/A 06/13/2017   Procedure: CORONARY STENT INTERVENTION;  Surgeon: Wellington Hampshire, MD;  Location: [place] [facility];  Service: Cardiovascular;  Laterality: N/A;   KNEE SURGERY  2004   right knee   LEFT HEART CATH AND CORONARY ANGIOGRAPHY N/A 06/13/2017   Procedure: LEFT HEART CATH AND CORONARY ANGIOGRAPHY;  Surgeon: Wellington Hampshire, MD;  Location: [place] [facility];  Service: Cardiovascular;  Laterality: N/A;   NM MYOCAR PERF WALL MOTION  02/2012   lexiscan - normal perfusion, EF 66%, low risk    PROSTATE SURGERY     for cancer   SUBTOTAL COLECTOMY     in [place] r/t adenocarcinoma of colon    TRANSTHORACIC ECHOCARDIOGRAM  12/2010   EF=>55%, mild conc LVH; borderline LA enlargement; calcification of anterior MV leaflets, mild MR; mild TR; RVSP 30-1mmHg; mild calcification of AV leaflets and mild-mod AV regurg; mild pulm regurg; aortic root sclerosis/calcification    FAMILY HISTORY: No family history on file.  SOCIAL HISTORY: Social History   Socioeconomic History   Marital status: Married    Spouse name: Not on file   Number of children: Not on file   Years of education: Not on file   Highest education level: Not on file  Occupational History   Not on file  Tobacco Use   Smoking status: Never Smoker   Smokeless tobacco: Never Used  Vaping Use   Vaping Use: Never used  Substance and Sexual Activity   Alcohol use: Yes    Alcohol/week: 0.0 standard drinks    Comment: ocassional glass of wine.   Drug use: No   Sexual activity: Not on file  Other Topics Concern   Not on file  Social History Narrative   Not on file   Social Determinants of Health   Financial Resource Strain:    Difficulty of Paying Living Expenses:   Food Insecurity:    Worried About [place] in the Last Year:    Arboriculturist in the Last Year:   Transportation Needs:    Lexicographer (Medical):    Lack of Transportation (Non-Medical):   Physical Activity:    Days of Exercise per Week:    Minutes of Exercise per Session:   Stress:    Feeling of Stress :   Social Connections:    Frequency of Communication with Friends and Family:    Frequency of Social Gatherings with Friends and Family:    Attends Religious Services:    Active Member of Clubs or Organizations:    Attends Archivist Meetings:    Marital Status:  Intimate Partner Violence:    Fear of Current or Ex-Partner:    Emotionally Abused:    Physically Abused:    Sexually Abused:       PHYSICAL EXAM  Vitals:   01/12/20 1415  BP: 127/72  Pulse: 62  Weight: 146 lb (66.2 kg)  Height: 5\' 7"  (1.702 m)   Body mass index is 22.87 kg/m.  Generalized: Well developed, in no acute distress  Chest: Lungs clear to auscultation bilaterally  Neurological examination  Mentation: Alert oriented to time, place, history taking. Follows all commands speech and language fluent Cranial nerve II-XII: Extraocular movements were full, visual field were full on confrontational test Head turning and shoulder shrug  were normal and symmetric. Motor: The motor testing reveals 5 over 5 strength of all 4 extremities. Good symmetric motor tone is noted throughout.  Sensory: Sensory testing is intact to soft touch on all 4 extremities. No evidence of extinction is noted.  Gait and station: Gait is normal.    DIAGNOSTIC DATA (LABS, IMAGING, TESTING) - I reviewed patient records, labs, notes, testing and imaging myself where available.  Lab Results  Component Value Date   WBC 5.9 10/31/2017   HGB 11.7 (L) 10/31/2017   HCT 35.2 (L) 10/31/2017   MCV 83 10/31/2017   PLT 123 (L) 10/31/2017      Component Value Date/Time   NA 142 10/31/2017 0930   K 4.6 10/31/2017 0930   CL 106 10/31/2017 0930   CO2 24 10/31/2017 0930   GLUCOSE 90 10/31/2017 0930   GLUCOSE 86 06/14/2017 0317    BUN 21 10/31/2017 0930   CREATININE 1.26 10/31/2017 0930   CALCIUM 9.3 10/31/2017 0930   PROT 7.0 10/31/2017 0930   ALBUMIN 4.1 10/31/2017 0930   AST 16 10/31/2017 0930   ALT 9 10/31/2017 0930   ALKPHOS 161 (H) 10/31/2017 0930   BILITOT 0.7 10/31/2017 0930   GFRNONAA 51 (L) 10/31/2017 0930   GFRAA 58 (L) 10/31/2017 0930   Lab Results  Component Value Date   CHOL 93 (L) 10/31/2017   HDL 37 (L) 10/31/2017   LDLCALC 43 10/31/2017   TRIG 64 10/31/2017   CHOLHDL 2.5 10/31/2017   No results found for: HGBA1C No results found for: VITAMINB12 Lab Results  Component Value Date   TSH 4.100 10/31/2017      ASSESSMENT AND PLAN 86 y.o. year old male  has a past medical history of Atypical angina ([place]) (06/02/2013), CAD S/P percutaneous coronary angioplasty (06/27/2007), Colon cancer ([place]) (1987), Coronary artery disease, Heart palpitations (05/16/2013), History of bacterial endocarditis (1980, 1990s), Hyperlipidemia, NSTEMI (non-ST elevated myocardial infarction) ([place]) (06/11/2017), OSA on CPAP, and Raynaud's disease (06/10/2013). here with:  1. OSA on CPAP  - CPAP compliance excellent - Good treatment of AHI  - Encourage patient to use CPAP nightly and > 4 hours each night - F/U in 1 year or sooner if needed   I spent *** minutes of face-to-face and non-face-to-face time with patient.  This included previsit chart review, lab review, study review, order entry, electronic health record documentation, patient education.  Ward Givens, MSN, NP-C 01/12/2020, 2:31 PM [facility] [address] [place], [place] [address] [phone]  . This encounter was created in error - please disregard.

## 2020-01-12 NOTE — Telephone Encounter (Signed)
Anthony Petty) called to inquire what is needed to save Pt a drive back to your office. Pt is 86 years old and live in [place] and would like to send anything [facility] would need for his CPAP. Can send CPAP compliance report if needed. Would like a call from the nurse.

## 2020-01-15 NOTE — Telephone Encounter (Signed)
Pt aware that he needs to either drop card off to our office or take it to [facility]

## 2020-01-26 ENCOUNTER — Ambulatory Visit: Payer: Medicare Other | Admitting: Cardiovascular Disease

## 2020-03-16 ENCOUNTER — Telehealth: Payer: Self-pay | Admitting: Adult Health

## 2020-03-16 ENCOUNTER — Encounter: Payer: Self-pay | Admitting: Neurology

## 2020-03-16 NOTE — Telephone Encounter (Signed)
I called pt and relayed that per last note, needed chip to read and download information.  He has a machine that is less then 84 yrs old.  I will call DME to see if can attach Dr. Brett Fairy so can get information.  I reached out to [employer] [phone].  She was able to attach and I did receive download for pt.  Pt needs appt. So can get new orders for supplies that he is requesting.  Not to be charged for last visit (stated was told he did not need to bring anything with him and when he arrived his appt had to be cancelled.  It is a hardship for him to come from [place].  I tried to call and no answer at this time.

## 2020-03-16 NOTE — Telephone Encounter (Signed)
I called pt and could not LM for him, will try later.

## 2020-03-16 NOTE — Telephone Encounter (Signed)
I called pt and relayed that we are able to to get cpap downloads now.  I can make appt for him to come in to office or do mychart VV.  He preferred to come in to office but was not able to address at this time,  I will call him tomorrow.

## 2020-03-16 NOTE — Telephone Encounter (Signed)
Pt called wanting to speak to RN regarding his Cpap machine and next appt. Please advise.

## 2020-03-17 NOTE — Telephone Encounter (Signed)
MM/NP had a cancellation for tomorrow at 1100, placed pt in the slot, arrive 1030-1045 for check in , he was appreciative.

## 2020-03-17 NOTE — Telephone Encounter (Signed)
I called pt and offered tomorrow at 0900, not able to come then.  Needs later am or tomorrow after 1430.  CPAP f/u needs supplies.

## 2020-03-18 ENCOUNTER — Other Ambulatory Visit: Payer: Self-pay

## 2020-03-18 ENCOUNTER — Encounter: Payer: Self-pay | Admitting: Adult Health

## 2020-03-18 ENCOUNTER — Ambulatory Visit (INDEPENDENT_AMBULATORY_CARE_PROVIDER_SITE_OTHER): Payer: Medicare Other | Admitting: Adult Health

## 2020-03-18 VITALS — BP 118/76 | Ht 67.0 in | Wt 145.2 lb

## 2020-03-18 DIAGNOSIS — Z9861 Coronary angioplasty status: Secondary | ICD-10-CM | POA: Diagnosis not present

## 2020-03-18 DIAGNOSIS — I2510 Atherosclerotic heart disease of native coronary artery without angina pectoris: Secondary | ICD-10-CM | POA: Diagnosis not present

## 2020-03-18 DIAGNOSIS — Z9989 Dependence on other enabling machines and devices: Secondary | ICD-10-CM

## 2020-03-18 DIAGNOSIS — G4733 Obstructive sleep apnea (adult) (pediatric): Secondary | ICD-10-CM

## 2020-03-18 NOTE — Patient Instructions (Signed)
Your Plan:  Continue using CPAP Order placed for titration study to evaluate apnea If your symptoms worsen or you develop new symptoms please let us know.    Thank you for coming to see [name] at [facility]. I hope we have been able to provide you high quality care today.  You may receive a patient satisfaction survey over the next few weeks. We would appreciate your feedback and comments so that we may continue to improve ourselves and the health of our patients.

## 2020-03-18 NOTE — Progress Notes (Signed)
PATIENT: Anthony Petty DOB: [birth year]  REASON FOR VISIT: follow up HISTORY FROM: patient  HISTORY OF PRESENT ILLNESS: Today 03/18/20:  [name] is a 86 year old male with a history of obstructive sleep apnea on CPAP.  His download indicates that he uses his machine nightly for compliance of 100%.  He uses his machine greater than 4 hours each night.  On average he uses his machine 8 hours and 43 minutes.  His residual AHI is 24.6 on 6 to 13 cm of water.  Leak in the 95th percentile is 28.4 L/min.  Apnea index indicate 18.8 central apneas 0 obstructive.  Patient returns today for an evaluation.  HISTORY 11/27/18:  Anthony Petty is an 86 year old male with a history of obstructive sleep apnea on CPAP.  He brought his machine with him today however his machine does not have a chip in it.  We were unable to obtain a wireless download.  We have reached out to his DME company to get a download.  He reports that he continues using the CPAP nightly.  He states that it works well for him.  He states that he did get a new machine but feels that his DME company did not explain how to use it very well.  He denies any significant issues.  He returns today for follow-up.  REVIEW OF SYSTEMS: Out of a complete 14 system review of symptoms, the patient complains only of the following symptoms, and all other reviewed systems are negative.  ESS 6  ALLERGIES: No Known Allergies  HOME MEDICATIONS: Outpatient Medications Prior to Visit  Medication Sig Dispense Refill  . amLODipine (NORVASC) 5 MG tablet TAKE 1/2 TABLET DAILY (Patient taking differently: TAKE 2.5 mg TABLET DAILY) 45 tablet 1  . aspirin 81 MG tablet Take 81 mg by mouth daily.    . cetirizine (ZYRTEC) 5 MG tablet Take 5 mg by mouth daily.    . Cholecalciferol (VITAMIN D3) 400 UNITS CAPS Take 1 capsule by mouth daily.    . clopidogrel (PLAVIX) 75 MG tablet TAKE 1 TABLET BY MOUTH DAILY 90 tablet 0  . co-enzyme Q-10 30 MG capsule  Take 30 mg by mouth daily.    . ferrous sulfate 325 (65 FE) MG tablet Take 1 tablet by mouth daily.    . fish oil-omega-3 fatty acids 1000 MG capsule Take 2 g by mouth daily.    . Glucosamine-Chondroit-Vit C-Mn (GLUCOSAMINE 1500 COMPLEX PO) Take 1 capsule by mouth daily.    . isosorbide mononitrate (IMDUR) 30 MG 24 hr tablet TAKE ONE (1) TABLET EACH DAY (Patient taking differently: TAKE ONE (1) TABLET (30mg ) EACH DAY) 90 tablet 0  . levothyroxine (SYNTHROID) 50 MCG tablet Take 50 mcg by mouth daily.    . Misc Natural Products (TURMERIC CURCUMIN) CAPS Take 1 capsule by mouth daily.    . nitroGLYCERIN (NITROSTAT) 0.4 MG SL tablet PLACE 1 TABLET UNDER THE TONGUE EVERY 5 MINUTES AS NEEDED FOR CHEST PAIN 25 tablet 3  . NON FORMULARY CPAP therapy    . saw palmetto 160 MG capsule Take 160 mg by mouth daily.    Marland Kitchen tolterodine (DETROL LA) 4 MG 24 hr capsule Take 4 mg by mouth 2 (two) times daily.    . valACYclovir (VALTREX) 500 MG tablet Take 500 mg by mouth 2 (two) times daily.    Marland Kitchen VYTORIN 10-20 MG tablet TAKE ONE (1) TABLET EACH DAY AT BEDTIME 90 tablet 0  . levothyroxine (SYNTHROID, LEVOTHROID) 25 MCG tablet Take 25  mcg by mouth daily before breakfast.     No facility-administered medications prior to visit.    PAST MEDICAL HISTORY: Past Medical History:  Diagnosis Date  . Atypical angina ([place]) 06/02/2013   Pt has chest pain that sounds atypical but he also has exertional fatigue that sounds similar to his presentation in 2007.  Marland Kitchen CAD S/P percutaneous coronary angioplasty 06/27/2007   RCA and CFX DES 2007 Myoview normal 2015   . Colon cancer ([place]) 1987   bladder cancer  . Coronary artery disease    06/13/17 ISR in mRCA with cutting balloon, PCI/DESx1, patent stents in Lcx, with occluded in OM, collaterals to the LAD  . Heart palpitations 05/16/2013  . History of bacterial endocarditis 1980, 1990s  . Hyperlipidemia   . NSTEMI (non-ST elevated myocardial infarction) ([place]) 06/11/2017  . OSA on  CPAP   . Raynaud's disease 06/10/2013    PAST SURGICAL HISTORY: Past Surgical History:  Procedure Laterality Date  . APPENDECTOMY    . BLADDER SURGERY     for bladder cancer  . CARDIAC CATHETERIZATION  08/07/2005   40-50% narrowing in LAD, 30-40% scattered irregularity of diagonal, subtotal-total prox Cfx occlusion w/collaterals of marginal 1, prox 30-40% RCA narrowing, 40-50% distal RCA stenosis (Dr. Corky Downs)  . CORONARY ANGIOPLASTY WITH STENT PLACEMENT  08/15/2005   stent to RCA and prox Cfx with 3x25mm DES to AV groove Cfx and 2.5x13 Cypher DES to marginal (Dr. Corky Downs)  . CORONARY STENT INTERVENTION N/A 06/13/2017   Procedure: CORONARY STENT INTERVENTION;  Surgeon: Wellington Hampshire, MD;  Location: [place] [facility];  Service: Cardiovascular;  Laterality: N/A;  . KNEE SURGERY  2004   right knee  . LEFT HEART CATH AND CORONARY ANGIOGRAPHY N/A 06/13/2017   Procedure: LEFT HEART CATH AND CORONARY ANGIOGRAPHY;  Surgeon: Wellington Hampshire, MD;  Location: [place] [facility];  Service: Cardiovascular;  Laterality: N/A;  . NM MYOCAR PERF WALL MOTION  02/2012   lexiscan - normal perfusion, EF 66%, low risk   . PROSTATE SURGERY     for cancer  . SUBTOTAL COLECTOMY     in [place] r/t adenocarcinoma of colon   . TRANSTHORACIC ECHOCARDIOGRAM  12/2010   EF=>55%, mild conc LVH; borderline LA enlargement; calcification of anterior MV leaflets, mild MR; mild TR; RVSP 30-30mmHg; mild calcification of AV leaflets and mild-mod AV regurg; mild pulm regurg; aortic root sclerosis/calcification    FAMILY HISTORY: No family history on file.  SOCIAL HISTORY: Social History   Socioeconomic History  . Marital status: Married    Spouse name: Not on file  . Number of children: Not on file  . Years of education: Not on file  . Highest education level: Not on file  Occupational History  . Not on file  Tobacco Use  . Smoking status: Never Smoker  . Smokeless tobacco: Never Used  Vaping Use  . Vaping  Use: Never used  Substance and Sexual Activity  . Alcohol use: Yes    Alcohol/week: 0.0 standard drinks    Comment: ocassional glass of wine.  . Drug use: No  . Sexual activity: Not on file  Other Topics Concern  . Not on file  Social History Narrative  . Not on file   Social Determinants of Health   Financial Resource Strain:   . Difficulty of Paying Living Expenses: Not on file  Food Insecurity:   . Worried About Charity fundraiser in the Last Year: Not on file  .  Ran Out of Food in the Last Year: Not on file  Transportation Needs:   . Lack of Transportation (Medical): Not on file  . Lack of Transportation (Non-Medical): Not on file  Physical Activity:   . Days of Exercise per Week: Not on file  . Minutes of Exercise per Session: Not on file  Stress:   . Feeling of Stress : Not on file  Social Connections:   . Frequency of Communication with Friends and Family: Not on file  . Frequency of Social Gatherings with Friends and Family: Not on file  . Attends Religious Services: Not on file  . Active Member of Clubs or Organizations: Not on file  . Attends Archivist Meetings: Not on file  . Marital Status: Not on file  Intimate Partner Violence:   . Fear of Current or Ex-Partner: Not on file  . Emotionally Abused: Not on file  . Physically Abused: Not on file  . Sexually Abused: Not on file      PHYSICAL EXAM  Vitals:   03/18/20 1127  BP: 118/76  Weight: 145 lb 3.2 oz (65.9 kg)  Height: 5\' 7"  (1.702 m)   Body mass index is 22.74 kg/m.  Generalized: Well developed, in no acute distress  Chest: Lungs clear to auscultation bilaterally  Neurological examination  Mentation: Alert oriented to time, place, history taking. Follows all commands speech and language fluent Cranial nerve II-XII: Extraocular movements were full, visual field were full on confrontational test Head turning and shoulder shrug  were normal and symmetric. Motor: The motor testing  reveals 5 over 5 strength of all 4 extremities. Good symmetric motor tone is noted throughout.  Sensory: Sensory testing is intact to soft touch on all 4 extremities. No evidence of extinction is noted.  Gait and station: Gait is normal.    DIAGNOSTIC DATA (LABS, IMAGING, TESTING) - I reviewed patient records, labs, notes, testing and imaging myself where available.  Lab Results  Component Value Date   WBC 5.9 10/31/2017   HGB 11.7 (L) 10/31/2017   HCT 35.2 (L) 10/31/2017   MCV 83 10/31/2017   PLT 123 (L) 10/31/2017      Component Value Date/Time   NA 142 10/31/2017 0930   K 4.6 10/31/2017 0930   CL 106 10/31/2017 0930   CO2 24 10/31/2017 0930   GLUCOSE 90 10/31/2017 0930   GLUCOSE 86 06/14/2017 0317   BUN 21 10/31/2017 0930   CREATININE 1.26 10/31/2017 0930   CALCIUM 9.3 10/31/2017 0930   PROT 7.0 10/31/2017 0930   ALBUMIN 4.1 10/31/2017 0930   AST 16 10/31/2017 0930   ALT 9 10/31/2017 0930   ALKPHOS 161 (H) 10/31/2017 0930   BILITOT 0.7 10/31/2017 0930   GFRNONAA 51 (L) 10/31/2017 0930   GFRAA 58 (L) 10/31/2017 0930   Lab Results  Component Value Date   CHOL 93 (L) 10/31/2017   HDL 37 (L) 10/31/2017   LDLCALC 43 10/31/2017   TRIG 64 10/31/2017   CHOLHDL 2.5 10/31/2017   No results found for: HGBA1C No results found for: VITAMINB12 Lab Results  Component Value Date   TSH 4.100 10/31/2017      ASSESSMENT AND PLAN 86 y.o. year old male  has a past medical history of Atypical angina ([place]) (06/02/2013), CAD S/P percutaneous coronary angioplasty (06/27/2007), Colon cancer ([place]) (1987), Coronary artery disease, Heart palpitations (05/16/2013), History of bacterial endocarditis (1980, 1990s), Hyperlipidemia, NSTEMI (non-ST elevated myocardial infarction) ([place]) (06/11/2017), OSA on CPAP, and Raynaud's disease (  06/10/2013). here with:  1. OSA on CPAP  - CPAP compliance excellent -Residual AHI is elevated-according to download mainly central sleep apneas.  We will order  CPAP titration with the option to advance to BiPAP therapy if needed. - Encourage patient to use CPAP nightly and > 4 hours each night - F/U  after titration   I spent 30 minutes of face-to-face and non-face-to-face time with patient.  This included previsit chart review, lab review, study review, order entry, electronic health record documentation, patient education.  Ward Givens, MSN, NP-C 03/18/2020, 2:29 PM [facility] [address], [place], [place] [address] [phone]

## 2020-03-25 ENCOUNTER — Telehealth: Payer: Self-pay

## 2020-03-25 NOTE — Telephone Encounter (Signed)
Returned pt v/m stating it would be better for him to call back and schedule in January 2022

## 2020-03-31 ENCOUNTER — Other Ambulatory Visit: Payer: Self-pay | Admitting: Cardiovascular Disease

## 2020-06-21 ENCOUNTER — Other Ambulatory Visit: Payer: Self-pay | Admitting: Cardiovascular Disease

## 2020-08-30 ENCOUNTER — Other Ambulatory Visit: Payer: Self-pay | Admitting: Cardiovascular Disease

## 2020-09-21 ENCOUNTER — Encounter: Payer: Self-pay | Admitting: Adult Health

## 2020-09-21 ENCOUNTER — Ambulatory Visit (INDEPENDENT_AMBULATORY_CARE_PROVIDER_SITE_OTHER): Payer: Medicare Other | Admitting: Adult Health

## 2020-09-21 VITALS — BP 126/65 | HR 64 | Ht 67.0 in | Wt 142.0 lb

## 2020-09-21 DIAGNOSIS — G4733 Obstructive sleep apnea (adult) (pediatric): Secondary | ICD-10-CM | POA: Diagnosis not present

## 2020-09-21 DIAGNOSIS — Z9989 Dependence on other enabling machines and devices: Secondary | ICD-10-CM | POA: Diagnosis not present

## 2020-09-21 NOTE — Patient Instructions (Signed)
Your Plan:  Order CPAP titration- call if you want to proceed If your symptoms worsen or you develop new symptoms please let us know.   Thank you for coming to see [name] at [facility]. I hope we have been able to provide you high quality care today.  You may receive a patient satisfaction survey over the next few weeks. We would appreciate your feedback and comments so that we may continue to improve ourselves and the health of our patients.

## 2020-09-21 NOTE — Progress Notes (Signed)
PATIENT: Anthony Petty DOB: [birth year]  REASON FOR VISIT: follow up HISTORY FROM: patient Primary Neurologist: Dr. Brett Fairy  HISTORY OF PRESENT ILLNESS: Today 09/21/20:  Anthony Petty is a 87 year old male with a history of obstructive sleep apnea on CPAP.  The patient never had CPAP titration.  Today he is quite frazzled as he lost his credit card.  He states that he is not sure that he wants to come back in for a CPAP titration.  Reports that he will discuss with his wife.  He feels that he sleeps well and does not need any further intervention.    03/18/20: Anthony Petty is a 87 year old male with a history of obstructive sleep apnea on CPAP.  His download indicates that he uses his machine nightly for compliance of 100%.  He uses his machine greater than 4 hours each night.  On average he uses his machine 8 hours and 43 minutes.  His residual AHI is 24.6 on 6 to 13 cm of water.  Leak in the 95th percentile is 28.4 L/min.  Apnea index indicate 18.8 central apneas 0 obstructive.  Patient returns today for an evaluation.  HISTORY 11/27/18:  Anthony Petty is an 87 year old male with a history of obstructive sleep apnea on CPAP.  He brought his machine with him today however his machine does not have a chip in it.  We were unable to obtain a wireless download.  We have reached out to his DME company to get a download.  He reports that he continues using the CPAP nightly.  He states that it works well for him.  He states that he did get a new machine but feels that his DME company did not explain how to use it very well.  He denies any significant issues.  He returns today for follow-up.  REVIEW OF SYSTEMS: Out of a complete 14 system review of symptoms, the patient complains only of the following symptoms, and all other reviewed systems are negative.  ESS 6  ALLERGIES: No Known Allergies  HOME MEDICATIONS: Outpatient Medications Prior to Visit  Medication Sig Dispense Refill   . amLODipine (NORVASC) 5 MG tablet TAKE 1/2 TABLET DAILY 45 tablet 1  . aspirin 81 MG tablet Take 81 mg by mouth daily.    . cetirizine (ZYRTEC) 5 MG tablet Take 5 mg by mouth daily.    . Cholecalciferol (VITAMIN D3) 400 UNITS CAPS Take 1 capsule by mouth daily.    . clopidogrel (PLAVIX) 75 MG tablet TAKE 1 TABLET BY MOUTH DAILY 90 tablet 0  . co-enzyme Q-10 30 MG capsule Take 30 mg by mouth daily.    . ferrous sulfate 325 (65 FE) MG tablet Take 1 tablet by mouth daily.    . fish oil-omega-3 fatty acids 1000 MG capsule Take 2 g by mouth daily.    . Glucosamine-Chondroit-Vit C-Mn (GLUCOSAMINE 1500 COMPLEX PO) Take 1 capsule by mouth daily.    . isosorbide mononitrate (IMDUR) 30 MG 24 hr tablet TAKE ONE (1) TABLET EACH DAY 90 tablet 0  . levothyroxine (SYNTHROID) 50 MCG tablet Take 50 mcg by mouth daily.    . Misc Natural Products (TURMERIC CURCUMIN) CAPS Take 1 capsule by mouth daily.    . nitroGLYCERIN (NITROSTAT) 0.4 MG SL tablet DISSOLVE 1 TABLET UNDER TONGUE EVERY 5 MINUTES FOR UP TO 3 DOSES AS NEEDED FOR CHEST PAIN 25 tablet 3  . NON FORMULARY CPAP therapy    . saw palmetto 160 MG capsule Take 160 mg by  mouth daily.    Marland Kitchen tolterodine (DETROL LA) 4 MG 24 hr capsule Take 4 mg by mouth 2 (two) times daily.    . valACYclovir (VALTREX) 500 MG tablet Take 500 mg by mouth 2 (two) times daily.    Marland Kitchen VYTORIN 10-20 MG tablet TAKE ONE (1) TABLET EACH DAY AT BEDTIME 90 tablet 0   No facility-administered medications prior to visit.    PAST MEDICAL HISTORY: Past Medical History:  Diagnosis Date  . 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  Marland Kitchen CAD S/P percutaneous coronary angioplasty 06/27/2007   RCA and CFX DES 2007 Myoview normal 2015   . Colon cancer ([place]) 1987   bladder cancer  . Coronary artery disease    06/13/17 ISR in mRCA with cutting balloon, PCI/DESx1, patent stents in Lcx,  with occluded in OM, collaterals to the LAD  . Heart palpitations 05/16/2013  . History of bacterial endocarditis 1980, 1990s  . Hyperlipidemia   . NSTEMI (non-ST elevated myocardial infarction) ([place]) 06/11/2017  . OSA on CPAP   . Raynaud's disease 06/10/2013    PAST SURGICAL HISTORY: Past Surgical History:  Procedure Laterality Date  . APPENDECTOMY    . BLADDER SURGERY     for bladder cancer  . CARDIAC CATHETERIZATION  08/07/2005   40-50% narrowing in LAD, 30-40% scattered irregularity of diagonal, subtotal-total prox Cfx occlusion w/collaterals of marginal 1, prox 30-40% RCA narrowing, 40-50% distal RCA stenosis (Dr. Corky Downs)  . CORONARY ANGIOPLASTY WITH STENT PLACEMENT  08/15/2005   stent to RCA and prox Cfx with 3x28mm DES to AV groove Cfx and 2.5x13 Cypher DES to marginal (Dr. Corky Downs)  . CORONARY STENT INTERVENTION N/A 06/13/2017   Procedure: CORONARY STENT INTERVENTION;  Surgeon: Wellington Hampshire, MD;  Location: [facility];  Service: Cardiovascular;  Laterality: N/A;  . KNEE SURGERY  2004   right knee  . LEFT HEART CATH AND CORONARY ANGIOGRAPHY N/A 06/13/2017   Procedure: LEFT HEART CATH AND CORONARY ANGIOGRAPHY;  Surgeon: Wellington Hampshire, MD;  Location: [facility];  Service: Cardiovascular;  Laterality: N/A;  . NM MYOCAR PERF WALL MOTION  02/2012   lexiscan - normal perfusion, EF 66%, low risk   . PROSTATE SURGERY     for cancer  . SUBTOTAL COLECTOMY     in [place] r/t adenocarcinoma of colon   . TRANSTHORACIC ECHOCARDIOGRAM  12/2010   EF=>55%, mild conc LVH; borderline LA enlargement; calcification of anterior MV leaflets, mild MR; mild TR; RVSP 30-55mmHg; mild calcification of AV leaflets and mild-mod AV regurg; mild pulm regurg; aortic root sclerosis/calcification    FAMILY HISTORY: No family history on file.  SOCIAL HISTORY: Social History   Socioeconomic History  . Marital status: Married    Spouse name: Not on file  . Number of children: Not on  file  . Years of education: Not on file  . Highest education level: Not on file  Occupational History  . Not on file  Tobacco Use  . Smoking status: Never Smoker  . Smokeless tobacco: Never Used  Vaping Use  . Vaping Use: Never used  Substance and Sexual Activity  . Alcohol use: Yes    Alcohol/week: 0.0 standard drinks    Comment: ocassional glass of wine.  . Drug use: No  . Sexual activity: Not on file  Other Topics Concern  . Not on  file  Social History Narrative  . Not on file   Social Determinants of Health   Financial Resource Strain: Not on file  Food Insecurity: Not on file  Transportation Needs: Not on file  Physical Activity: Not on file  Stress: Not on file  Social Connections: Not on file  Intimate Partner Violence: Not on file      PHYSICAL EXAM  Vitals:   09/21/20 1052  BP: 126/65  Pulse: 64  Weight: 142 lb (64.4 kg)  Height: 5\' 7"  (1.702 m)   Body mass index is 22.24 kg/m.  Generalized: Well developed, in no acute distress  Chest: Lungs clear to auscultation bilaterally  Neurological examination  Mentation: Alert oriented to time, place, history taking. Follows all commands speech and language fluent Cranial nerve II-XII: Extraocular movements were full, visual field were full on confrontational test Head turning and shoulder shrug  were normal and symmetric. Motor: The motor testing reveals 5 over 5 strength of all 4 extremities. Good symmetric motor tone is noted throughout.  Sensory: Sensory testing is intact to soft touch on all 4 extremities. No evidence of extinction is noted.  Gait and station: Gait is normal.    DIAGNOSTIC DATA (LABS, IMAGING, TESTING) - I reviewed patient records, labs, notes, testing and imaging myself where available.  Lab Results  Component Value Date   WBC 5.9 10/31/2017   HGB 11.7 (L) 10/31/2017   HCT 35.2 (L) 10/31/2017   MCV 83 10/31/2017   PLT 123 (L) 10/31/2017      Component Value Date/Time   NA  142 10/31/2017 0930   K 4.6 10/31/2017 0930   CL 106 10/31/2017 0930   CO2 24 10/31/2017 0930   GLUCOSE 90 10/31/2017 0930   GLUCOSE 86 06/14/2017 0317   BUN 21 10/31/2017 0930   CREATININE 1.26 10/31/2017 0930   CALCIUM 9.3 10/31/2017 0930   PROT 7.0 10/31/2017 0930   ALBUMIN 4.1 10/31/2017 0930   AST 16 10/31/2017 0930   ALT 9 10/31/2017 0930   ALKPHOS 161 (H) 10/31/2017 0930   BILITOT 0.7 10/31/2017 0930   GFRNONAA 51 (L) 10/31/2017 0930   GFRAA 58 (L) 10/31/2017 0930   Lab Results  Component Value Date   CHOL 93 (L) 10/31/2017   HDL 37 (L) 10/31/2017   LDLCALC 43 10/31/2017   TRIG 64 10/31/2017   CHOLHDL 2.5 10/31/2017   No results found for: HGBA1C No results found for: VITAMINB12 Lab Results  Component Value Date   TSH 4.100 10/31/2017      ASSESSMENT AND PLAN 87 y.o. year old male  has a past medical history of Atypical angina ([place]) (06/02/2013), CAD S/P percutaneous coronary angioplasty (06/27/2007), Colon cancer ([place]) (1987), Coronary artery disease, Heart palpitations (05/16/2013), History of bacterial endocarditis (1980, 1990s), Hyperlipidemia, NSTEMI (non-ST elevated myocardial infarction) ([place]) (06/11/2017), OSA on CPAP, and Raynaud's disease (06/10/2013). here with:  1. OSA on CPAP  - CPAP compliance excellent -Residual AHI remains elevated.  Advised patient that a CPAP titration is needed for possible transition to BiPAP.  The patient is not sure that he wants to proceed with this.  Advised to call our office if he wants me to reorder this test. - Encourage patient to use CPAP nightly and > 4 hours each night     Ward Givens, MSN, NP-C 09/21/2020, 11:05 AM [facility] [address] [place], [place] [address] [phone]

## 2020-09-23 ENCOUNTER — Other Ambulatory Visit: Payer: Self-pay | Admitting: Cardiovascular Disease

## 2020-10-12 ENCOUNTER — Ambulatory Visit (INDEPENDENT_AMBULATORY_CARE_PROVIDER_SITE_OTHER): Payer: Medicare Other | Admitting: Cardiovascular Disease

## 2020-10-12 ENCOUNTER — Encounter: Payer: Self-pay | Admitting: Cardiovascular Disease

## 2020-10-12 ENCOUNTER — Telehealth: Payer: Self-pay | Admitting: Cardiovascular Disease

## 2020-10-12 ENCOUNTER — Other Ambulatory Visit: Payer: Self-pay

## 2020-10-12 DIAGNOSIS — Z9989 Dependence on other enabling machines and devices: Secondary | ICD-10-CM

## 2020-10-12 DIAGNOSIS — Z9861 Coronary angioplasty status: Secondary | ICD-10-CM | POA: Diagnosis not present

## 2020-10-12 DIAGNOSIS — I10 Essential (primary) hypertension: Secondary | ICD-10-CM

## 2020-10-12 DIAGNOSIS — R011 Cardiac murmur, unspecified: Secondary | ICD-10-CM

## 2020-10-12 DIAGNOSIS — R0789 Other chest pain: Secondary | ICD-10-CM

## 2020-10-12 DIAGNOSIS — C7951 Secondary malignant neoplasm of bone: Secondary | ICD-10-CM

## 2020-10-12 DIAGNOSIS — I2510 Atherosclerotic heart disease of native coronary artery without angina pectoris: Secondary | ICD-10-CM

## 2020-10-12 DIAGNOSIS — C61 Malignant neoplasm of prostate: Secondary | ICD-10-CM

## 2020-10-12 DIAGNOSIS — E785 Hyperlipidemia, unspecified: Secondary | ICD-10-CM

## 2020-10-12 DIAGNOSIS — G4733 Obstructive sleep apnea (adult) (pediatric): Secondary | ICD-10-CM

## 2020-10-12 NOTE — Patient Instructions (Signed)
Medication Instructions:  Per Dr. [name], if your amlodipine dose is 5mg , increase your isosorbide to 45mg  (1.5 pills of 30mg ). If your amlodipine dose is 2.5mg  increase it to 5mg  and keep your isosorbide at 30mg . We will adjust your medication list when you determine your dosage and call the office.  *If you need a refill on your cardiac medications before your next appointment, please call your pharmacy*   Lab Work: None ordered.    Testing/Procedures:  To be scheduled at [address] in 3 months. Your physician has requested that you have an echocardiogram. Echocardiography is a painless test that uses sound waves to create images of your heart. It provides your doctor with information about the size and shape of your heart and how well your heart's chambers and valves are working. This procedure takes approximately one hour. There are no restrictions for this procedure.     Follow-Up: At [facility], you and your health needs are our priority.  As part of our continuing mission to provide you with exceptional heart care, we have created designated Provider Care Teams.  These Care Teams include your primary Cardiologist (physician) and Advanced Practice Providers (APPs -  Physician Assistants and Nurse Practitioners) who all work together to provide you with the care you need, when you need it.  We recommend signing up for the patient portal called "MyChart".  Sign up information is provided on this After Visit Summary.  MyChart is used to connect with patients for Virtual Visits (Telemedicine).  Patients are able to view lab/test results, encounter notes, upcoming appointments, etc.  Non-urgent messages can be sent to your provider as well.   To learn more about what you can do with MyChart, go to [email or web address].    Your next appointment:   4 month(s)  The format for your next appointment:   In Person  Provider:   Shelva Majestic, MD

## 2020-10-12 NOTE — Progress Notes (Signed)
Patient ID: Anthony Petty Driskill, male   DOB: 1928-03-11, 87 y.o.   MRN: [id number]   Primary M.D.: [facility]  HPI: Anthony Petty is a 87 y.o. male who presents for a 10 month follow-up cardiology evaluation with me.   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 He has known CAD and cardiac catheterization in 2007 showed total occlusion of the circumflex vessel as well as high-grade AV groove circumflex and RCA disease for which he underwent stenting of his RCA and recanalization of a totally occluded circumflex and marginal system. Additional problems include paroxysmal atrial fibrillation. An echo Doppler study in August 2012 showed mild LVH with normal systolic function and grade 1 diastolic dysfunction. He had mild aortic sclerosis with mild-to-moderate aortic insufficiency, mild MR, mild TR and PR. There is mild pulmonary hypertension with an estimated PA pressure 35 mm. His last stress test was in October 2013 prior to a trip to [place] when he had experienced some vague symptoms of chest pain. The nuclear perfusion study was unchanged and continued to show fairly normal perfusion. He also has a history of obstructive sleep apnea for which he sees Dr. Fannie Kneelint Young.  He has had issues of occasional palpitations, weakness and fatigue. His beta blocker dose was reduced from Lopressor 12.5 twice a day to 12.5 mg daily and last year had a CardioNet monitor.  CardioNet monitor demonstrated a 4 beat episode of PSVT. He also complained of "tingling "in the left upper chest region with inspiration.  He underwent a follow-up nuclear study which continued to reveal normal perfusion without scar or ischemia.   When I saw him in January 2015 complained that his fingers turning white particularly with cold weather on a routine basis. He has to wear gloves the entire winter season and at times there may be transient red and blue discoloration.   I was concerned about the possibility of Raynaud's versus digital artery vasospasm and elected to start him on low-dose amlodipine at 2.5 mg which improved some of his symptomatology.  He has developed bradycardia in the past which was felt to be due to development of sinus node dysfunction.  He also was found to have an elevated PSA and underwent urologic evaluation.    He underwent an echo Doppler study on 01/17/2016.  This showed an EF of 60-65%.  He had grade 1 diastolic dysfunction.  There was mild aortic insufficiency.  There was mitral annular calcification.  He was seen by Corine ShelterLuke Kilroy in February 2018 with complaints of some mild chest pain and exertional fatigue.  He underwent a nuclear perfusion study on 08/25/2016 which was normal.  Ejection fraction was 61%.  There were no ECG changes.  There was no evidence for scar or ischemia.  He also underwent carotid duplex imaging which showed mild heterogenous plaque with very mild narrowing in the 1-39% range and normal antegrade flow in his vertebrals.   [facility]  His [facility] who has checked laboratory.   He was hospitalized in January 2019 with palpitations and pressure in both sides of his neck.  A nuclear perfusion study was interpreted as abnormal with inferoseptal reversibility.  He underwent repeat cardiac catheterization by Dr. Kennith MaesArrida on 06/13/2017.  He was found to have 95% in-stent restenosis of his RCA stent.  Circumflex stent was patent and there was occlusion of the OM 2 vessel with  good collaterals.  There was concomitant disease in his diagonal and LAD, treated medically.  He was seen by Jory Sims, NP on June 26 2017 in follow-up of his hospitalization.  I saw him in May 2019 at which time he was doing well and was without recurrent chest pain and denied any recurrence of symptoms since his last intervention.   Since I last saw him, he was diagnosed with  prostate CA and has had bone metastases, urinary retention, and is felt to have a high risk of recurrence due to a Gleason score of 8-10 and significant PSA elevation at 178 initially.  He is followed by Dr. Estill Dooms and is now on hormone blockade with Mills Koller therapy and has required a Foley catheter for urinary retention.  He had ureteral obstruction and stents were placed.  If he continues to be unable to void with hormone blockade he may require TUR P and would need to stop Plavix for this procedure.  He denies any anginal symptoms.  He denies PND orthopnea.  He denies fevers chills or night sweats.    I saw him in December 2019, and last saw him in July 2021.  At that time he felt well.  He had experienced some nonexertional chest discomfort which ultimately resolved.  He is followed by Dr. Merilynn Finland at [facility] where he resides ov he had undergone hormonal therapy for his prostate cancer.  He has continued to be followed by Dr. Brett Fairy for his obstructive sleep apnea on CPAP.   Presently, he has felt well.  He has a Foley catheter in place.  He also has left colostomy.  He has noticed some very slight dull chest pain intermittently but has not experienced any for the last several months.  He has been on aspirin which he takes on Tuesday Thursday Saturday and Sunday and continues to be on clopidogrel daily.  He has been on isosorbide 30 mg for his anginal symptoms.  He continues to use CPAP.  He denies fever chills or night sweats.  He presents for evaluation.  Past Medical History:  Diagnosis Date  . Atypical angina ([place]) 06/02/2013   Pt has chest pain that sounds atypical but he also has exertional fatigue that sounds similar to his presentation in 2007.  Marland Kitchen CAD S/P percutaneous coronary angioplasty 06/27/2007   RCA and CFX DES 2007 Myoview normal 2015   . Colon cancer ([place]) 1987   bladder cancer  . Coronary artery disease    06/13/17 ISR in mRCA with cutting balloon, PCI/DESx1, patent  stents in Lcx, with occluded in OM, collaterals to the LAD  . Heart palpitations 05/16/2013  . History of bacterial endocarditis 1980, 1990s  . Hyperlipidemia   . NSTEMI (non-ST elevated myocardial infarction) ([place]) 06/11/2017  . OSA on CPAP   . Raynaud's disease 06/10/2013    Past Surgical History:  Procedure Laterality Date  . APPENDECTOMY    . BLADDER SURGERY     for bladder cancer  . CARDIAC CATHETERIZATION  08/07/2005   40-50% narrowing in LAD, 30-40% scattered irregularity of diagonal, subtotal-total prox Cfx occlusion w/collaterals of marginal 1, prox 30-40% RCA narrowing, 40-50% distal RCA stenosis (Dr. Corky Downs)  . CORONARY ANGIOPLASTY WITH STENT PLACEMENT  08/15/2005   stent to RCA and prox Cfx with 3x14mm DES to AV groove Cfx and 2.5x13 Cypher DES to marginal (Dr. Corky Downs)  . CORONARY STENT INTERVENTION N/A 06/13/2017   Procedure: CORONARY STENT INTERVENTION;  Surgeon: Wellington Hampshire, MD;  Location: [place] [facility];  Service: Cardiovascular;  Laterality: N/A;  . KNEE SURGERY  2004   right knee  . LEFT HEART CATH AND CORONARY ANGIOGRAPHY N/A 06/13/2017   Procedure: LEFT HEART CATH AND CORONARY ANGIOGRAPHY;  Surgeon: Wellington Hampshire, MD;  Location: [place] [facility];  Service: Cardiovascular;  Laterality: N/A;  . NM MYOCAR PERF WALL MOTION  02/2012   lexiscan - normal perfusion, EF 66%, low risk   . PROSTATE SURGERY     for cancer  . SUBTOTAL COLECTOMY     in [place] r/t adenocarcinoma of colon   . TRANSTHORACIC ECHOCARDIOGRAM  12/2010   EF=>55%, mild conc LVH; borderline LA enlargement; calcification of anterior MV leaflets, mild MR; mild TR; RVSP 30-56mmHg; mild calcification of AV leaflets and mild-mod AV regurg; mild pulm regurg; aortic root sclerosis/calcification    No Known Allergies  Current Outpatient Medications  Medication Sig Dispense Refill  . aspirin 81 MG tablet Take 81 mg by mouth daily.    . cetirizine (ZYRTEC) 5 MG tablet Take 5 mg by mouth daily.     . Cholecalciferol (VITAMIN D3) 400 UNITS CAPS Take 1 capsule by mouth daily.    . clopidogrel (PLAVIX) 75 MG tablet TAKE 1 TABLET BY MOUTH DAILY 90 tablet 0  . co-enzyme Q-10 30 MG capsule Take 30 mg by mouth daily.    . ferrous sulfate 325 (65 FE) MG tablet Take 1 tablet by mouth daily.    . fish oil-omega-3 fatty acids 1000 MG capsule Take 2 g by mouth daily.    . Glucosamine-Chondroit-Vit C-Mn (GLUCOSAMINE 1500 COMPLEX PO) Take 1 capsule by mouth daily.    Marland Kitchen levothyroxine (SYNTHROID) 50 MCG tablet Take 50 mcg by mouth daily.    . Misc Natural Products (TURMERIC CURCUMIN) CAPS Take 1 capsule by mouth daily.    . nitroGLYCERIN (NITROSTAT) 0.4 MG SL tablet DISSOLVE 1 TABLET UNDER TONGUE EVERY 5 MINUTES FOR UP TO 3 DOSES AS NEEDED FOR CHEST PAIN 25 tablet 3  . NON FORMULARY CPAP therapy    . saw palmetto 160 MG capsule Take 160 mg by mouth daily.    Marland Kitchen tolterodine (DETROL LA) 4 MG 24 hr capsule Take 4 mg by mouth 2 (two) times daily.    Marland Kitchen VYTORIN 10-20 MG tablet TAKE ONE (1) TABLET EACH DAY AT BEDTIME 90 tablet 0  . amLODipine (NORVASC) 5 MG tablet Take 1 tablet (5 mg total) by mouth daily. 90 tablet 3  . isosorbide mononitrate (IMDUR) 30 MG 24 hr tablet TAKE ONE (1) TABLET EACH DAY 90 tablet 0   No current facility-administered medications for this visit.    Social History   Socioeconomic History  . Marital status: Married    Spouse name: Not on file  . Number of children: Not on file  . Years of education: Not on file  . Highest education level: Not on file  Occupational History  . Not on file  Tobacco Use  . Smoking status: Never Smoker  . Smokeless tobacco: Never Used  Vaping Use  . Vaping Use: Never used  Substance and Sexual Activity  . Alcohol use: Yes    Alcohol/week: 0.0 standard drinks    Comment: ocassional glass of wine.  . Drug use: No  . Sexual activity: Not on file  Other Topics Concern  . Not on file  Social History Narrative  . Not on file   Social  Determinants of Health   Financial Resource Strain: Not  on file  Food Insecurity: Not on file  Transportation Needs: Not on file  Physical Activity: Not on file  Stress: Not on file  Social Connections: Not on file  Intimate Partner Violence: Not on file   Additional social history is notable that he is originally from [place]. He has 3 children 2 grandchildren. He does exercise. There is no alcohol or tobacco use.  Parents are deceased.  ROS General: Negative; No fevers, chills, or night sweats;  HEENT: Negative; No changes in vision or hearing, sinus congestion, difficulty swallowing Pulmonary: Negative; No cough, wheezing, shortness of breath, hemoptysis Cardiovascular: Negative; No chest pain, presyncope, syncope, palpitations GI: Negative; No nausea, vomiting, diarrhea, or abdominal pain GU: Metastatic prostate CA to bone, urinary retention with Foley.  On hormonal therapy Musculoskeletal: Negative; no myalgias, joint pain, or weakness Hematologic/Oncology: On hormonal therapy for his prostate cancer; no easy bruising, bleeding Endocrine: Negative; no heat/cold intolerance; no diabetes Neuro: Negative; no changes in balance, headaches Skin: Negative; No rashes or skin lesions Psychiatric: Negative; No behavioral problems, depression Sleep: Positive for obstructive sleep apnea, followed by Dr. Annamaria Boots; No snoring, daytime sleepiness, hypersomnolence, bruxism, restless legs, hypnogognic hallucinations, no cataplexy Other comprehensive 14 point system review is negative.   PE BP (!) 152/62   Pulse 64   Ht 5\' 8"  (1.727 m)   Wt 142 lb (64.4 kg)   SpO2 99%   BMI 21.59 kg/m    Repeat blood pressure by me was 120/70  Wt Readings from Last 3 Encounters:  10/12/20 142 lb (64.4 kg)  09/21/20 142 lb (64.4 kg)  03/18/20 145 lb 3.2 oz (65.9 kg)   General: Alert, oriented, no distress.  Skin: normal turgor, no rashes, warm and dry HEENT: Normocephalic, atraumatic. Pupils equal  round and reactive to light; sclera anicteric; extraocular muscles intact;  Nose without nasal septal hypertrophy Mouth/Parynx benign; Mallinpatti scale 3 Neck: No JVD, no carotid bruits; normal carotid upstroke Lungs: clear to ausculatation and percussion; no wheezing or rales Chest wall: without tenderness to palpitation Heart: PMI not displaced, RRR, s1 s2 normal, 2/6 systolic murmur, no diastolic murmur, no rubs, gallops, thrills, or heaves Abdomen: Left colostomy; soft, nontender; no hepatosplenomehaly, BS+; abdominal aorta nontender and not dilated by palpation. Back: no CVA tenderness Pulses 2+ Catheter with urinary bag. Musculoskeletal: full range of motion, normal strength, no joint deformities Extremities: no clubbing cyanosis or edema, Homan's sign negative  Neurologic: grossly nonfocal; Cranial nerves grossly wnl Psychologic: Normal mood and affect    ECG (independently read by me): NSR at 64; small inferiro Q waves  July 2021 ECG (independently read by me):NSR at 64; no ectopy; normal intervals  December 2019 ECG (independently read by me): Sinus bradycardia 52 bpm.  Small Q wave in 3.  No ectopy.  Normal intervals.  May 2019 ECG (independently read by me): Normal sinus rhythm at 60 bpm.  PAC.  Normal intervals.  No ST segment changes.  May 2018 ECG (independently read by me): Sinus bradycardia 51 bpm.  Normal intervals.  No ST segment changes.  January 2017 ECG (independently read by me):  Sinus bradycardia 53 bpm.  No significant ST segment changes.  November 2016 ECG (independently read by me): Sinus bradycardia at 48 bpm.  April 2016 ECG (independently read by me): Sinus bradycardia at 51 bpm.  No ectopy.  Normal intervals.  October 2015 ECG (independently read by me): Sinus bradycardia 58 beats per minute.  Normal intervals.  No ST segment changes.  LABS:  BMP Latest Ref Rng & Units 10/31/2017 06/14/2017 06/13/2017  Glucose 65 - 99 mg/dL 90 86 719(L)  BUN 8 - 27  mg/dL 21 15 13   Creatinine 0.76 - 1.27 mg/dL 9.74 7.18 5.50  BUN/Creat Ratio 10 - 24 17 - -  Sodium 134 - 144 mmol/L 142 137 140  Potassium 3.5 - 5.2 mmol/L 4.6 3.8 3.9  Chloride 96 - 106 mmol/L 106 108 111  CO2 20 - 29 mmol/L 24 22 20(L)  Calcium 8.6 - 10.2 mg/dL 9.3 8.9 9.2      Component Value Date/Time   PROT 7.0 10/31/2017 0930   ALBUMIN 4.1 10/31/2017 0930   AST 16 10/31/2017 0930   ALT 9 10/31/2017 0930   ALKPHOS 161 (H) 10/31/2017 0930   BILITOT 0.7 10/31/2017 0930       Component Value Date/Time   WBC 5.9 10/31/2017 0930   WBC 5.5 06/14/2017 0317   RBC 4.24 10/31/2017 0930   RBC 4.49 06/14/2017 0317   HGB 11.7 (L) 10/31/2017 0930   HCT 35.2 (L) 10/31/2017 0930   PLT 123 (L) 10/31/2017 0930   MCV 83 10/31/2017 0930   MCH 27.6 10/31/2017 0930   MCH 27.2 06/14/2017 0317   MCHC 33.2 10/31/2017 0930   MCHC 32.9 06/14/2017 0317   RDW 14.8 10/31/2017 0930   LYMPHSABS 1.7 07/17/2007 1200   MONOABS 0.6 07/17/2007 1200   EOSABS 0.0 07/17/2007 1200   BASOSABS 0.0 07/17/2007 1200     BNP No results found for: PROBNP    Lipid Panel     Component Value Date/Time   CHOL 93 (L) 10/31/2017 0930   TRIG 64 10/31/2017 0930   HDL 37 (L) 10/31/2017 0930   CHOLHDL 2.5 10/31/2017 0930   LDLCALC 43 10/31/2017 0930    RADIOLOGY: No results found.  IMPRESSION:  1. CAD S/P percutaneous coronary angioplasty   2. Chest pressure   3. Murmur   4. Essential hypertension   5. OSA on CPAP   6. Hyperlipidemia with target LDL less than 70   7. Prostate cancer metastatic to bone [facility])     ASSESSMENT AND PLAN: Mr. Shatzer is a 87 year old gentleman who has a remote history of endocarditis x2 initially in1980 and subsequently in the early 1990s. He is status post intervention to his RCA and recanalization of the circumflex system which was noted to be occluded with collateralization from the LAD to the OM prior to intervention in 2007. He has a 30x28 mm Cypher stent in the  AV groove and 2.5x13 mm Cypher stent in the OM1 ostium. The RCA has a 3.0x23 mm Cypher stent in its midsegment.  He had developed probable sinus node dysfunction leading to discontinuance of beta blocker therapy.  In January 2019 he was found to have high-grade in-stent restenosis of his mid RCA stent and underwent successful cutting balloon, and ultimate DES stenting of his mid RCA.  His AV groove circumflex stent was patent with an occluded stent to the marginal vessel which had developed collaterals from the LAD.  Since his last evaluation with me, he apparently underwent repeat cardiac catheterization in January 2019 done by Dr. Kirke Corin and had significant focal in-stent restenosis in the proximal portion of the previously placed stent.  He had patent stents in the circumflex with the old occluded OM 2 with well-developed collaterals from the LAD.  He underwent successful cutting balloon angioplasty and drug-eluting stent placement to the proximal RCA for his in-stent restenosis.  He has been continued  on long-term DAPT and has been taking aspirin at 4 days/week rather than daily.  On exam, his cardiac murmur is slightly progressed.  His initial blood pressure was elevated at 152/62 although normalized on repeat by me.  With his episodic chest pressure, I have suggested titration of amlodipine from 2.5 mg up to 5 mg daily.  I am recommending a follow-up echo Doppler study to reassess systolic and diastolic function as well as his aortic valve.  He is followed by Dr. Brett Fairy for his sleep.  Apparently a download was available today in the office which I reviewed and I have suggested he follow-up with Dr. Brett Fairy since he may need pressure adjustment.  On his download, AHI was 18.7 with a central index of 12.1.  He tells me Dr. Brett Fairy had discussed with him the possibility of getting a new sleep study.  I will see him in 3 to 4 months for follow-up Cardiologic evaluation.   Troy Sine, MD, [facility]   10/14/2020 1:43 PM

## 2020-10-12 NOTE — Telephone Encounter (Signed)
*  STAT* If patient is at the pharmacy, call can be transferred to refill team.   1. Which medications need to be refilled? (please list name of each medication and dose if known) new prescription for Amlodipine 5 mg and Isosorbide 30 mg  2. Which pharmacy/location (including street and city if local pharmacy) is medication to be sent to [facility]  . Do they need a 30 day or 90 day supply? 90 days and refills

## 2020-10-13 MED ORDER — ISOSORBIDE MONONITRATE ER 30 MG PO TB24: ORAL_TABLET | ORAL | 0 refills | Status: DC

## 2020-10-13 MED ORDER — AMLODIPINE BESYLATE 5 MG PO TABS: 5.0000 mg | ORAL_TABLET | Freq: Every day | ORAL | 3 refills | Status: DC

## 2020-10-13 NOTE — Telephone Encounter (Signed)
This RN called patient back to clarify what his dose of amlodipine is as of his appointment yesterday. Patient reports his dose has been 2.5mg . Per Dr. [name], if the patient's amlodipine dose is 2.5, it should be increased to 5mg  and the patient's isosorbide should stay at 30mg .

## 2020-10-14 ENCOUNTER — Encounter: Payer: Self-pay | Admitting: Cardiovascular Disease

## 2020-11-04 ENCOUNTER — Telehealth: Payer: Self-pay | Admitting: Cardiovascular Disease

## 2020-11-04 NOTE — Telephone Encounter (Signed)
*  STAT* If patient is at the pharmacy, call can be transferred to refill team.   1. Which medications need to be refilled? (please list name of each medication and dose if known)  Amoxicillin 500 mg   2. Which pharmacy/location (including street and city if local pharmacy) is medication to be sent to? [facility] - [place], [place] - [address]  3. Do they need a 30 day or 90 day supply? 18 pills  Patient needs to take 6 capsules 2 hours prior to dental work and 3 capsules after his dental work.   Patient is scheduled to have dental work 12/02/20 and will need the medication prior

## 2020-11-04 NOTE — Telephone Encounter (Signed)
Amoxicillin is not on med list and I do not see a cardiac clinical reason patient would need pre-meds for dental procedures/work  Will route to MD/RN

## 2020-11-11 ENCOUNTER — Ambulatory Visit (HOSPITAL_COMMUNITY): Payer: Medicare Other | Attending: Internal Medicine

## 2020-11-11 ENCOUNTER — Other Ambulatory Visit: Payer: Self-pay

## 2020-11-11 DIAGNOSIS — R011 Cardiac murmur, unspecified: Secondary | ICD-10-CM | POA: Insufficient documentation

## 2020-11-11 LAB — ECHOCARDIOGRAM COMPLETE
AR max vel: 1.31 cm2
AV Area VTI: 1.61 cm2
AV Area mean vel: 1.21 cm2
AV Mean grad: 20 mmHg
AV Peak grad: 35.8 mmHg
Ao pk vel: 2.99 m/s
Area-P 1/2: 2.31 cm2
P 1/2 time: 677 msec
S' Lateral: 2.6 cm

## 2020-12-14 NOTE — Telephone Encounter (Signed)
Ok to renew?  

## 2020-12-16 NOTE — Telephone Encounter (Signed)
This RN called pt to clarify, as no antibiotics were found on his medication list. Pt reports his dental visit has passed and he does not need any medication at this time. This RN to make Dr. [name] aware. All questions/concerns addressed at this time. Pt verbalized understanding to call office back if he has questions/concerns.

## 2021-01-06 ENCOUNTER — Other Ambulatory Visit: Payer: Self-pay | Admitting: Cardiovascular Disease

## 2021-01-16 ENCOUNTER — Inpatient Hospital Stay (HOSPITAL_COMMUNITY)
Admission: EM | Admit: 2021-01-16 | Discharge: 2021-01-21 | DRG: 177 | Disposition: A | Discharge status: Skilled Nursing Facility | Payer: Medicare Other | Attending: Internal Medicine | Admitting: Internal Medicine

## 2021-01-16 ENCOUNTER — Other Ambulatory Visit: Payer: Self-pay

## 2021-01-16 ENCOUNTER — Emergency Department (HOSPITAL_COMMUNITY): Payer: Medicare Other

## 2021-01-16 DIAGNOSIS — N1831 Chronic kidney disease, stage 3a: Secondary | ICD-10-CM | POA: Diagnosis present

## 2021-01-16 DIAGNOSIS — R778 Other specified abnormalities of plasma proteins: Secondary | ICD-10-CM | POA: Diagnosis not present

## 2021-01-16 DIAGNOSIS — Z9861 Coronary angioplasty status: Secondary | ICD-10-CM | POA: Diagnosis not present

## 2021-01-16 DIAGNOSIS — Z66 Do not resuscitate: Secondary | ICD-10-CM | POA: Diagnosis present

## 2021-01-16 DIAGNOSIS — G4733 Obstructive sleep apnea (adult) (pediatric): Secondary | ICD-10-CM | POA: Diagnosis present

## 2021-01-16 DIAGNOSIS — N179 Acute kidney failure, unspecified: Secondary | ICD-10-CM | POA: Diagnosis not present

## 2021-01-16 DIAGNOSIS — I10 Essential (primary) hypertension: Secondary | ICD-10-CM

## 2021-01-16 DIAGNOSIS — Z7902 Long term (current) use of antithrombotics/antiplatelets: Secondary | ICD-10-CM

## 2021-01-16 DIAGNOSIS — I7300 Raynaud's syndrome without gangrene: Secondary | ICD-10-CM | POA: Diagnosis present

## 2021-01-16 DIAGNOSIS — Z7982 Long term (current) use of aspirin: Secondary | ICD-10-CM | POA: Diagnosis not present

## 2021-01-16 DIAGNOSIS — J189 Pneumonia, unspecified organism: Secondary | ICD-10-CM

## 2021-01-16 DIAGNOSIS — R7989 Other specified abnormal findings of blood chemistry: Secondary | ICD-10-CM

## 2021-01-16 DIAGNOSIS — Z955 Presence of coronary angioplasty implant and graft: Secondary | ICD-10-CM

## 2021-01-16 DIAGNOSIS — I252 Old myocardial infarction: Secondary | ICD-10-CM

## 2021-01-16 DIAGNOSIS — Z8551 Personal history of malignant neoplasm of bladder: Secondary | ICD-10-CM | POA: Diagnosis not present

## 2021-01-16 DIAGNOSIS — Z933 Colostomy status: Secondary | ICD-10-CM

## 2021-01-16 DIAGNOSIS — Z85038 Personal history of other malignant neoplasm of large intestine: Secondary | ICD-10-CM | POA: Diagnosis not present

## 2021-01-16 DIAGNOSIS — I2510 Atherosclerotic heart disease of native coronary artery without angina pectoris: Secondary | ICD-10-CM | POA: Diagnosis present

## 2021-01-16 DIAGNOSIS — N138 Other obstructive and reflux uropathy: Secondary | ICD-10-CM

## 2021-01-16 DIAGNOSIS — Z79890 Hormone replacement therapy (postmenopausal): Secondary | ICD-10-CM

## 2021-01-16 DIAGNOSIS — E871 Hypo-osmolality and hyponatremia: Secondary | ICD-10-CM | POA: Diagnosis present

## 2021-01-16 DIAGNOSIS — R609 Edema, unspecified: Secondary | ICD-10-CM | POA: Diagnosis not present

## 2021-01-16 DIAGNOSIS — I214 Non-ST elevation (NSTEMI) myocardial infarction: Secondary | ICD-10-CM | POA: Diagnosis present

## 2021-01-16 DIAGNOSIS — I5021 Acute systolic (congestive) heart failure: Secondary | ICD-10-CM | POA: Diagnosis present

## 2021-01-16 DIAGNOSIS — J1282 Pneumonia due to coronavirus disease 2019: Secondary | ICD-10-CM | POA: Diagnosis present

## 2021-01-16 DIAGNOSIS — Z79899 Other long term (current) drug therapy: Secondary | ICD-10-CM

## 2021-01-16 DIAGNOSIS — I130 Hypertensive heart and chronic kidney disease with heart failure and stage 1 through stage 4 chronic kidney disease, or unspecified chronic kidney disease: Secondary | ICD-10-CM | POA: Diagnosis present

## 2021-01-16 DIAGNOSIS — U071 COVID-19: Secondary | ICD-10-CM | POA: Diagnosis present

## 2021-01-16 DIAGNOSIS — J9690 Respiratory failure, unspecified, unspecified whether with hypoxia or hypercapnia: Secondary | ICD-10-CM

## 2021-01-16 DIAGNOSIS — E785 Hyperlipidemia, unspecified: Secondary | ICD-10-CM | POA: Diagnosis present

## 2021-01-16 DIAGNOSIS — N401 Enlarged prostate with lower urinary tract symptoms: Secondary | ICD-10-CM | POA: Diagnosis present

## 2021-01-16 DIAGNOSIS — I080 Rheumatic disorders of both mitral and aortic valves: Secondary | ICD-10-CM | POA: Diagnosis present

## 2021-01-16 DIAGNOSIS — I420 Dilated cardiomyopathy: Secondary | ICD-10-CM | POA: Diagnosis not present

## 2021-01-16 DIAGNOSIS — J9601 Acute respiratory failure with hypoxia: Secondary | ICD-10-CM

## 2021-01-16 LAB — COMPREHENSIVE METABOLIC PANEL
ALT: 21 U/L (ref 0–44)
AST: 28 U/L (ref 15–41)
Albumin: 2.8 g/dL — ABNORMAL LOW (ref 3.5–5.0)
Alkaline Phosphatase: 66 U/L (ref 38–126)
Anion gap: 7 (ref 5–15)
BUN: 41 mg/dL — ABNORMAL HIGH (ref 8–23)
CO2: 23 mmol/L (ref 22–32)
Calcium: 8.8 mg/dL — ABNORMAL LOW (ref 8.9–10.3)
Chloride: 100 mmol/L (ref 98–111)
Creatinine, Ser: 1.54 mg/dL — ABNORMAL HIGH (ref 0.61–1.24)
GFR, Estimated: 42 mL/min — ABNORMAL LOW (ref >60)
Glucose, Bld: 112 mg/dL — ABNORMAL HIGH (ref 70–99)
Potassium: 4.4 mmol/L (ref 3.5–5.1)
Sodium: 130 mmol/L — ABNORMAL LOW (ref 135–145)
Total Bilirubin: 0.7 mg/dL (ref 0.3–1.2)
Total Protein: 6.7 g/dL (ref 6.5–8.1)

## 2021-01-16 LAB — BASIC METABOLIC PANEL
Anion gap: 9 (ref 5–15)
BUN: 41 mg/dL — ABNORMAL HIGH (ref 8–23)
CO2: 20 mmol/L — ABNORMAL LOW (ref 22–32)
Calcium: 8.4 mg/dL — ABNORMAL LOW (ref 8.9–10.3)
Chloride: 101 mmol/L (ref 98–111)
Creatinine, Ser: 1.56 mg/dL — ABNORMAL HIGH (ref 0.61–1.24)
GFR, Estimated: 42 mL/min — ABNORMAL LOW (ref >60)
Glucose, Bld: 144 mg/dL — ABNORMAL HIGH (ref 70–99)
Potassium: 4.7 mmol/L (ref 3.5–5.1)
Sodium: 130 mmol/L — ABNORMAL LOW (ref 135–145)

## 2021-01-16 LAB — CBC
HCT: 33.6 % — ABNORMAL LOW (ref 39.0–52.0)
HCT: 32 % — ABNORMAL LOW (ref 39.0–52.0)
Hemoglobin: 10.7 g/dL — ABNORMAL LOW (ref 13.0–17.0)
Hemoglobin: 10.2 g/dL — ABNORMAL LOW (ref 13.0–17.0)
MCH: 25.3 pg — ABNORMAL LOW (ref 26.0–34.0)
MCH: 25.6 pg — ABNORMAL LOW (ref 26.0–34.0)
MCHC: 31.8 g/dL (ref 30.0–36.0)
MCHC: 31.9 g/dL (ref 30.0–36.0)
MCV: 79.4 fL — ABNORMAL LOW (ref 80.0–100.0)
MCV: 80.4 fL (ref 80.0–100.0)
Platelets: 314 10*3/uL (ref 150–400)
Platelets: 280 10*3/uL (ref 150–400)
RBC: 4.23 MIL/uL (ref 4.22–5.81)
RBC: 3.98 MIL/uL — ABNORMAL LOW (ref 4.22–5.81)
RDW: 14.6 % (ref 11.5–15.5)
RDW: 14.6 % (ref 11.5–15.5)
WBC: 12 10*3/uL — ABNORMAL HIGH (ref 4.0–10.5)
WBC: 12.9 10*3/uL — ABNORMAL HIGH (ref 4.0–10.5)
nRBC: 0 % (ref 0.0–0.2)
nRBC: 0 % (ref 0.0–0.2)

## 2021-01-16 LAB — TROPONIN I (HIGH SENSITIVITY)
Troponin I (High Sensitivity): 954 ng/L (ref <18)
Troponin I (High Sensitivity): 640 ng/L (ref <18)

## 2021-01-16 LAB — CREATININE, SERUM
Creatinine, Ser: 1.55 mg/dL — ABNORMAL HIGH (ref 0.61–1.24)
GFR, Estimated: 42 mL/min — ABNORMAL LOW (ref >60)

## 2021-01-16 LAB — BRAIN NATRIURETIC PEPTIDE: B Natriuretic Peptide: 1628.3 pg/mL — ABNORMAL HIGH (ref 0.0–100.0)

## 2021-01-16 LAB — MRSA NEXT GEN BY PCR, NASAL: MRSA by PCR Next Gen: NOT DETECTED

## 2021-01-16 LAB — LACTATE DEHYDROGENASE
LDH: 206 U/L — ABNORMAL HIGH (ref 98–192)
LDH: 183 U/L (ref 98–192)

## 2021-01-16 LAB — FIBRINOGEN: Fibrinogen: 535 mg/dL — ABNORMAL HIGH (ref 210–475)

## 2021-01-16 LAB — FERRITIN
Ferritin: 111 ng/mL (ref 24–336)
Ferritin: 102 ng/mL (ref 24–336)

## 2021-01-16 LAB — PROCALCITONIN: Procalcitonin: 10.4 ng/mL

## 2021-01-16 LAB — C-REACTIVE PROTEIN
CRP: 3.9 mg/dL — ABNORMAL HIGH (ref <1.0)
CRP: 3.7 mg/dL — ABNORMAL HIGH (ref <1.0)

## 2021-01-16 LAB — TRIGLYCERIDES: Triglycerides: 79 mg/dL (ref <150)

## 2021-01-16 LAB — MAGNESIUM: Magnesium: 2.4 mg/dL (ref 1.7–2.4)

## 2021-01-16 MED ORDER — ALBUTEROL SULFATE (2.5 MG/3ML) 0.083% IN NEBU: 2.5000 mg | INHALATION_SOLUTION | RESPIRATORY_TRACT | Status: DC | PRN

## 2021-01-16 MED ORDER — ONDANSETRON HCL 4 MG PO TABS: 4.0000 mg | ORAL_TABLET | Freq: Four times a day (QID) | ORAL | Status: DC | PRN

## 2021-01-16 MED ORDER — ACETAMINOPHEN 325 MG PO TABS: 650.0000 mg | ORAL_TABLET | Freq: Four times a day (QID) | ORAL | Status: DC | PRN

## 2021-01-16 MED ORDER — CLOPIDOGREL BISULFATE 75 MG PO TABS: 75.0000 mg | ORAL_TABLET | Freq: Every day | ORAL | Status: DC

## 2021-01-16 MED ORDER — ACETAMINOPHEN 650 MG RE SUPP: 650.0000 mg | Freq: Four times a day (QID) | RECTAL | Status: DC | PRN

## 2021-01-16 MED ORDER — ISOSORBIDE MONONITRATE ER 30 MG PO TB24
30.0000 mg | ORAL_TABLET | Freq: Every day | ORAL | Status: DC
  Administered 2021-01-17 – 2021-01-21 (×5): 30 mg via ORAL
  Filled 2021-01-16 (×5): qty 1

## 2021-01-16 MED ORDER — ASPIRIN EC 81 MG PO TBEC: 81.0000 mg | DELAYED_RELEASE_TABLET | Freq: Every day | ORAL | Status: DC

## 2021-01-16 MED ORDER — ASPIRIN 81 MG PO CHEW
324.0000 mg | CHEWABLE_TABLET | Freq: Once | ORAL | Status: AC
  Administered 2021-01-16: 324 mg via ORAL
  Filled 2021-01-16: qty 4

## 2021-01-16 MED ORDER — DEXAMETHASONE SODIUM PHOSPHATE 10 MG/ML IJ SOLN
6.0000 mg | INTRAMUSCULAR | Status: DC
Start: 2021-01-17 — End: 2021-01-17

## 2021-01-16 MED ORDER — DEXAMETHASONE SODIUM PHOSPHATE 10 MG/ML IJ SOLN
6.0000 mg | Freq: Once | INTRAMUSCULAR | Status: AC
  Administered 2021-01-16: 6 mg via INTRAVENOUS
  Filled 2021-01-16: qty 1

## 2021-01-16 MED ORDER — DEXAMETHASONE 2 MG PO TABS: 6.0000 mg | ORAL_TABLET | Freq: Every day | ORAL | Status: DC

## 2021-01-16 MED ORDER — SODIUM CHLORIDE 0.9 % IV BOLUS
500.0000 mL | Freq: Once | INTRAVENOUS | Status: AC
  Administered 2021-01-16: 500 mL via INTRAVENOUS

## 2021-01-16 MED ORDER — ASPIRIN EC 81 MG PO TBEC
81.0000 mg | DELAYED_RELEASE_TABLET | Freq: Every day | ORAL | Status: DC
  Administered 2021-01-17 – 2021-01-21 (×5): 81 mg via ORAL
  Filled 2021-01-16 (×5): qty 1

## 2021-01-16 MED ORDER — FUROSEMIDE 10 MG/ML IJ SOLN: 40.0000 mg | Freq: Every day | INTRAMUSCULAR | Status: DC

## 2021-01-16 MED ORDER — REMDESIVIR 100 MG IV SOLR
100.0000 mg | Freq: Every day | INTRAVENOUS | Status: AC
  Administered 2021-01-17 – 2021-01-20 (×4): 100 mg via INTRAVENOUS
  Filled 2021-01-16 (×5): qty 20

## 2021-01-16 MED ORDER — CLOPIDOGREL BISULFATE 75 MG PO TABS
75.0000 mg | ORAL_TABLET | Freq: Every day | ORAL | Status: DC
  Administered 2021-01-17 – 2021-01-21 (×5): 75 mg via ORAL
  Filled 2021-01-16 (×5): qty 1

## 2021-01-16 MED ORDER — ENOXAPARIN SODIUM 40 MG/0.4ML IJ SOSY
40.0000 mg | PREFILLED_SYRINGE | INTRAMUSCULAR | Status: DC
  Administered 2021-01-16: 40 mg via SUBCUTANEOUS
  Filled 2021-01-16: qty 0.4

## 2021-01-16 MED ORDER — SODIUM CHLORIDE 0.9 % IV SOLN
200.0000 mg | Freq: Once | INTRAVENOUS | Status: AC
  Administered 2021-01-16: 200 mg via INTRAVENOUS
  Filled 2021-01-16: qty 200

## 2021-01-16 MED ORDER — FUROSEMIDE 10 MG/ML IJ SOLN
40.0000 mg | Freq: Once | INTRAMUSCULAR | Status: AC
  Administered 2021-01-16: 40 mg via INTRAVENOUS
  Filled 2021-01-16: qty 4

## 2021-01-16 MED ORDER — FUROSEMIDE 10 MG/ML IJ SOLN: 40.0000 mg | Freq: Once | INTRAMUSCULAR | Status: DC

## 2021-01-16 MED ORDER — ONDANSETRON HCL 4 MG/2ML IJ SOLN: 4.0000 mg | Freq: Four times a day (QID) | INTRAMUSCULAR | Status: DC | PRN

## 2021-01-16 NOTE — ED Notes (Signed)
Anthony Petty (daughter):[phone]

## 2021-01-16 NOTE — Assessment & Plan Note (Signed)
Stable

## 2021-01-16 NOTE — ED Notes (Signed)
Estanislado Emms, nephew, [phone], contact person for wife.

## 2021-01-16 NOTE — Assessment & Plan Note (Addendum)
--   Multifactorial, COVID, possible bacterial pneumonia, acute systolic heart failure --Continue antibiotics, COVID treatment, treatment directed CHF

## 2021-01-16 NOTE — ED Notes (Signed)
RT at bedside.

## 2021-01-16 NOTE — H&P (Signed)
History and Physical    Anthony Petty [id number] DOB: Dec 03, 1927 DOA: 01/16/2021  PCP: Javier Glazier, MD   Patient coming from: Home  I have personally briefly reviewed patient's old medical records in [place]  CC: SOB HPI: 87 year old male with a prior history of colon cancer status post colostomy, coronary disease, hypertension, BPH presents to the ER today with worsening shortness of breath.  He was diagnosed with COVID on August 16.  He was given a Medrol Dosepak and doxycycline.  He returns to the ER today with continued shortness of breath.  He was in respiratory extremis.  Patient initially started out at 2 L/min nasal cannula oxygen and quickly escalated to 15 L with nonrebreather mask.  Patient in respiratory stress and cannot give any history review of systems.  Patient did present to the ER with a portable DNR.  He does verify that he is a DNR.  He states that he would not want to be intubated for respiratory distress or respiratory failure.  Evaluation the ER.  His COVID test was positive on the 16th.  Chest x-ray demonstrates widespread pulmonary infiltrates.  He also may have bilateral pleural effusions.  BNP was elevated at 1628.  CRP elevated 2.9.  Ferritin normal at 111.  LDH elevated 206.  Procalcitonin level elevated at 10.4.   ED Course: given IV decadron and IV remdesivir in ER.  Review of Systems:  Review of Systems  Unable to perform ROS: Critical illness   Past Medical History:  Diagnosis Date   Atypical angina ([facility]) 06/02/2013   Pt has chest pain that sounds atypical but he also has exertional fatigue that sounds similar to his presentation in 2007.   CAD S/P percutaneous coronary angioplasty 06/27/2007   RCA and CFX DES 2007 Myoview normal 2015    Colon cancer ([place]) 1987   bladder cancer   Coronary artery disease    06/13/17 ISR in mRCA with cutting balloon, PCI/DESx1, patent stents in Lcx, with occluded in OM, collaterals to the LAD    Heart palpitations 05/16/2013   History of bacterial endocarditis 1980, 1990s   Hyperlipidemia    NSTEMI (non-ST elevated myocardial infarction) ([place]) 06/11/2017   OSA on CPAP    Raynaud's disease 06/10/2013    Past Surgical History:  Procedure Laterality Date   APPENDECTOMY     BLADDER SURGERY     for bladder cancer   CARDIAC CATHETERIZATION  08/07/2005   40-50% narrowing in LAD, 30-40% scattered irregularity of diagonal, subtotal-total prox Cfx occlusion w/collaterals of marginal 1, prox 30-40% RCA narrowing, 40-50% distal RCA stenosis (Dr. Corky Downs)   CORONARY ANGIOPLASTY WITH STENT PLACEMENT  08/15/2005   stent to RCA and prox Cfx with 3x24m DES to AV groove Cfx and 2.5x13 Cypher DES to marginal (Dr. TCorky Downs   CORONARY STENT INTERVENTION N/A 06/13/2017   Procedure: CORONARY STENT INTERVENTION;  Surgeon: AWellington Hampshire MD;  [facility];  Service: Cardiovascular;  Laterality: N/A;   KNEE SURGERY  2004   right knee   LEFT HEART CATH AND CORONARY ANGIOGRAPHY N/A 06/13/2017   Procedure: LEFT HEART CATH AND CORONARY ANGIOGRAPHY;  Surgeon: AWellington Hampshire MD;  [facility];  Service: Cardiovascular;  Laterality: N/A;   NM MYOCAR PERF WALL MOTION  02/2012   lexiscan - normal perfusion, EF 66%, low risk    PROSTATE SURGERY     for cancer   [place]    TRANSTHORACIC ECHOCARDIOGRAM  12/2010   EF=>55%, mild conc LVH; borderline LA enlargement; calcification of anterior MV leaflets, mild MR; mild TR; RVSP 30-75mHg; mild calcification of AV leaflets and mild-mod AV regurg; mild pulm regurg; aortic root sclerosis/calcification     reports that he has never smoked. He has never used smokeless tobacco. He reports current alcohol use. He reports that he does not use drugs.  No Known Allergies  No family history on file.  Prior to Admission medications   Medication Sig Start Date End Date Taking?  Authorizing Provider  amLODipine (NORVASC) 5 MG tablet Take 1 tablet (5 mg total) by mouth daily. 10/13/20  Yes KTroy Sine MD  aspirin 81 MG tablet Take 81 mg by mouth daily.   Yes [provider]  clopidogrel (PLAVIX) 75 MG tablet TAKE 1 TABLET BY MOUTH DAILY 01/06/21  Yes KTroy Sine MD  co-enzyme Q-10 30 MG capsule Take 30 mg by mouth daily.   Yes [provider]  isosorbide mononitrate (IMDUR) 30 MG 24 hr tablet TAKE ONE (1) TABLET EACH DAY 10/13/20  Yes KTroy Sine MD  levothyroxine (SYNTHROID) 50 MCG tablet Take 50 mcg by mouth daily. 03/08/20  Yes [provider]  Misc Natural Products (TURMERIC CURCUMIN) CAPS Take 1 capsule by mouth daily.   Yes [provider]  Pantothenic Acid (PANTO-250 PO) Take 1 capsule by mouth daily.   Yes [provider]  saw palmetto 160 MG capsule Take 160 mg by mouth daily.   Yes [provider]  tamsulosin (FLOMAX) 0.4 MG CAPS capsule Take 0.4 mg by mouth daily.   Yes [provider]  VYTORIN 10-20 MG tablet TAKE ONE (1) TABLET EACH DAY AT BEDTIME 09/16/15  Yes KTroy Sine MD  amLODipine (NORVASC) 2.5 MG tablet TAKE ONE (1) TABLET BY MOUTH EACH DAY 08/03/20   [provider]  cetirizine (ZYRTEC) 5 MG tablet Take 5 mg by mouth daily.    [provider]  Cholecalciferol (VITAMIN D3) 400 UNITS CAPS Take 1 capsule by mouth daily.    [provider]  doxycycline (VIBRAMYCIN) 100 MG capsule Take 100 mg by mouth 2 (two) times daily. 01/11/21   [provider]  ferrous sulfate 325 (65 FE) MG tablet Take 1 tablet by mouth daily. 09/23/19   [provider]  fish oil-omega-3 fatty acids 1000 MG capsule Take 2 g by mouth daily.    [provider]  Glucosamine-Chondroit-Vit C-Mn (GLUCOSAMINE 1500 COMPLEX PO) Take 1 capsule by mouth daily.    [provider]  magnesium oxide (MAG-OX) 400 (240 Mg) MG tablet Take 2 tablets by mouth at  bedtime. 01/10/21   [provider]  nitroGLYCERIN (NITROSTAT) 0.4 MG SL tablet DISSOLVE 1 TABLET UNDER TONGUE EVERY 5 MINUTES FOR UP TO 3 DOSES AS NEEDED FOR CHEST PAIN 08/30/20   KTroy Sine MD  NON FORMULARY CPAP therapy    [provider]  solifenacin (VESICARE) 10 MG tablet Take 10 mg by mouth daily. 12/24/20   [provider]  tolterodine (DETROL LA) 4 MG 24 hr capsule Take 4 mg by mouth 2 (two) times daily.    [provider]    Physical Exam: Vitals:   01/16/21 [id number]/22 1600 01/16/21 1610 01/16/21 1635  BP:  113/70    Pulse: 80 82 85   Resp: (!) 28 (!) 28 (!) 34   Temp:  TempSrc:      SpO2: 92% (!) 88% 94% (!) 89%    Physical Exam Vitals and nursing note reviewed.  Constitutional:      General: He is in acute distress.     Appearance: He is ill-appearing.  HENT:     Head: Normocephalic and atraumatic.  Cardiovascular:     Rate and Rhythm: Regular rhythm. Tachycardia present.  Pulmonary:     Effort: Tachypnea and accessory muscle usage present.     Breath sounds: Examination of the right-upper field reveals rhonchi. Examination of the left-upper field reveals rhonchi. Examination of the right-middle field reveals rhonchi. Examination of the left-middle field reveals rhonchi. Examination of the right-lower field reveals rhonchi. Examination of the left-lower field reveals rhonchi. Decreased breath sounds and rhonchi present.  Abdominal:     Comments: Colostomy in LLQ  Musculoskeletal:     Right lower leg: No edema.  Skin:    General: Skin is warm and dry.     Capillary Refill: Capillary refill takes less than 2 seconds.  Neurological:     General: No focal deficit present.     Mental Status: He is oriented to person, place, and time.     Labs on Admission: I have personally reviewed following labs and imaging studies  CBC: Recent Labs  Lab 01/16/21 1339  WBC 12.0*  HGB 10.7*  HCT 33.6*  MCV 79.4*  PLT [id number]    Basic Metabolic Panel: Recent Labs  Lab 01/16/21 1339  NA 130*  K 4.4  CL 100  CO2 23  GLUCOSE 112*  BUN 41*  CREATININE 1.54*  CALCIUM 8.8*   GFR: CrCl cannot be calculated (Unknown ideal weight.). Liver Function Tests: Recent Labs  Lab 01/16/21 1339  AST 28  ALT 21  ALKPHOS 66  BILITOT 0.7  PROT 6.7  ALBUMIN 2.8*   No results for input(s): LIPASE, AMYLASE in the last 168 hours. No results for input(s): AMMONIA in the last 168 hours. Coagulation Profile: No results for input(s): INR, PROTIME in the last 168 hours. Cardiac Enzymes: No results for input(s): CKTOTAL, CKMB, CKMBINDEX, TROPONINI in the last 168 hours. BNP (last 3 results) No results for input(s): PROBNP in the last 8760 hours. HbA1C: No results for input(s): HGBA1C in the last 72 hours. CBG: No results for input(s): GLUCAP in the last 168 hours. Lipid Profile: Recent Labs    01/16/21 1339  TRIG 79   Thyroid Function Tests: No results for input(s): TSH, T4TOTAL, FREET4, T3FREE, THYROIDAB in the last 72 hours. Anemia Panel: Recent Labs    01/16/21 1339  FERRITIN 111   Urine analysis:    Component Value Date/Time   COLORURINE AMBER BIOCHEMICALS MAY BE AFFECTED BY COLOR (A) 07/17/2007 1151   APPEARANCEUR CLEAR 07/17/2007 1151   LABSPEC 1.017 07/17/2007 1151   PHURINE 7.0 07/17/2007 1151   GLUCOSEU NEGATIVE 07/17/2007 1151   HGBUR NEGATIVE 07/17/2007 1151   BILIRUBINUR SMALL (A) 07/17/2007 1151   KETONESUR NEGATIVE 07/17/2007 1151   PROTEINUR NEGATIVE 07/17/2007 1151   UROBILINOGEN 2.0 (H) 07/17/2007 1151   NITRITE NEGATIVE 07/17/2007 1151   LEUKOCYTESUR SMALL (A) 07/17/2007 1151    Radiological Exams on Admission: I have personally reviewed images DG Chest Port 1 View  Result Date: 01/16/2021 CLINICAL DATA:  Increasing shortness of breath. History of colon carcinoma. Tested positive for COVID-19 3 days ago. EXAM: PORTABLE CHEST 1 VIEW COMPARISON:  08/23/2017 FINDINGS: Bilateral  airspace lung opacities.  Possible small effusions. No pneumothorax. Cardiac silhouette normal  in size.  No mediastinal or hilar masses. There are multiple areas of sclerosis in the visualized skeleton involving the clavicles, scapula, proximal right humerus, vertebra and ribs. These are new from the prior study. IMPRESSION: 1. Bilateral airspace lung opacities consistent with multifocal pneumonia given the history of testing positive for COVID-19. 2. Multiple bilateral sclerotic bone lesions consistent with metastatic disease. Patient's history reports colon carcinoma in 1987. The appearance of these lesions is more suggestive of metastatic prostate carcinoma. Electronically Signed   By: Lajean Manes M.D.   On: 01/16/2021 14:03    EKG: I have personally reviewed EKG: NSR 3  Assessment/Plan Principal Problem:   Pneumonia due to COVID-19 virus Active Problems:   Acute hypoxemic respiratory failure due to COVID-19 [facility])   CAD S/P percutaneous coronary angioplasty   S/P colostomy (HCC)   Benign prostatic hyperplasia with urinary obstruction   Benign essential hypertension   DNR (do not resuscitate)/DNI(Do Not Intubate)    Pneumonia due to COVID-19 virus Admit to step down unit. Already on high flow O2 and NRB. May need heated high flow. Pt verifies that he is a DNR/DNI. Pt okay with using Bipap/NIPPV. Pt wears CPAP at home. Have consulted PPCM. Dr. Silas Flood. Pt may need Toci vs barcitinib. Received IV Decadron and IV remdesivir in ER. Will give IV lasix due to elevated BNP. No prior hx of CHF. Last echo 10-2020 with LVEF of 65%. Has moderate AS on echo. lovenox for DVT prophylaxis.  Acute hypoxemic respiratory failure due to COVID-19 [facility]) Continue with supplemental O2. May need NIPPV/Bipap.  CAD S/P percutaneous coronary angioplasty Continue ASA and imdur.  S/P colostomy (HCC) Chronic.  Benign prostatic hyperplasia with urinary obstruction Place foley catheter for accurate  I&Os.  Benign essential hypertension Stable.  DNR (do not resuscitate)/DNI(Do Not Intubate) Verified with pt that he wants to remain DNR/DNI. Pt would not want to be intubated for respiratory failure. Pt agreeable to using NIPPV/Bipap for respiratory distress. He states he uses CPAP at home.     DVT prophylaxis: Lovenox Code Status: DNR/DNI(Do NOT Intubate) Family Communication: attempted to call his nephew Estanislado Emms x 3 attempts @  [phone]. No answer Disposition Plan: unknown  Consults called: ICU. Dr. Silas Flood  Admission status: Inpatient, Step Down Unit   Kristopher Oppenheim, DO [facility] 01/16/2021, 4:53 PM

## 2021-01-16 NOTE — Plan of Care (Signed)
Able to contact his nephew Estanislado Emms and give him update. Pt's wife has dementia and lives at [place]. Pt's other sisters live in [place]. Mr. Jimmye Norman states he will remain point of contact for pt's family. Mr. Jimmye Norman understands the gravity of the situation regarding pt's respiratory status.

## 2021-01-16 NOTE — ED Triage Notes (Signed)
Pt arrives via EMS from [place]. PT tested positive for covid on Thursday. Staff reports increased sob.

## 2021-01-16 NOTE — ED Notes (Signed)
Ashok Cordia MD and Bland Span RN aware critical trop 954.

## 2021-01-16 NOTE — Assessment & Plan Note (Addendum)
--  Asymptomatic.  Continue ASA, coreg and imdur.

## 2021-01-16 NOTE — Assessment & Plan Note (Addendum)
--   Continues to improve, I think this is mild at this point, he is down to 3 L. -- Given clinical improvement no indication for Actemra/baricitinib, but will continue continue remdesivir and steroid, vitamins.  Continue to wean oxygen as tolerated.

## 2021-01-16 NOTE — Consult Note (Signed)
NAME:  Anthony Petty, MRN:  [id number], DOB:  1927-12-08, LOS: 0 ADMISSION DATE:  01/16/2021, CONSULTATION DATE:  01/16/21  REFERRING MD: TRH, CHIEF COMPLAINT: Shortness of breath  History of Present Illness:  87 year old whom we are seeing in consultation for evaluation of dyspnea exertion, severe hypoxemic respiratory failure in setting of COVID-19 infection.  ED note reviewed.  H&P reviewed.  History largely obtained via EMR given relative respiratory distress at time of evaluation.  Patient diagnosed with COVID 01/12/2020.  Was given steroids and doxycycline.  Unclear if got Paxlovid.  Despite these interventions, he developed worsening shortness of breath over the last several hours.  This prompted presentation to ED.  He was noted to have increased work of breathing hypoxemic.  Document O2 sats okay on 2 to 3 L at initial evaluation.  However due to reported oxygen desaturations and increased work of breathing his oxygen was escalated.  Up to 15 L salter with nonrebreather on top.  No heated high flow in the ED.  Labs notable for stable CKD with creatinine near baseline compared to recent labs in care everywhere.  He has hyponatremia to 130, BNP markedly elevated to 1600, troponin initially 900 downtrending to 600.  Inflammatory markers moderately elevated.  Chest x-ray performed on my review and interpretation shows diffuse bilateral alveolar filling and interstitial opacities with bilateral pleural effusions with element of pulmonary edema/volume overload with superimposed multifocal or atypical or viral pneumonia possible.  Given Decadron and remdesivir in the ED.  Discussed case with on-call [facility] provider.  Given patient is DNR, admit to stepdown under [place].  Advise giving Lasix given chest x-ray appearance and BNP on labs.  The ED nurse said patient had put out about 200 cc or more the first hour after Lasix.  Placed on BiPAP after arrival to the unit.  Marked improvement in work of breathing, O2  sats hit 100% shortly after placement.  Pertinent  Medical History  Diastolic dysfunction, LA dilation, mitral valve regurg, aortic valve regurgitation, aortic stenosis  Significant Hospital Events: Including procedures, antibiotic start and stop dates in addition to other pertinent events   8/21 admitted with hypoxemia due to COVID-pneumonia as well as volume overload  Interim History / Subjective:  As above  Objective   Blood pressure 113/60, pulse 80, temperature 98.1 F (36.7 C), temperature source Oral, resp. rate (!) 31, SpO2 95 %.    FiO2 (%):  [100 %] 100 %  No intake or output data in the 24 hours ending 01/16/21 1717 There were no vitals filed for this visit.  Examination: General: Elderly, in distress Pulmonary: Diffuse rhonchi, and story crackles, increased work of breathing with abdominal muscle use that improved shortly after placing BiPAP Cardiovascular: Regular rate and rhythm, late systolic murmur heard, warm Abdomen: Ostomy in place, clean dry and intact nothing in the bag   Resolved Hospital Problem list     Assessment & Plan:  Acute hypoxemic respiratory failure: Suspect multifactorial related to COVID-19 pneumonia as well as volume overload.  Given relatively mild disease with most recent variance, suspect volume overload is the biggest contributor. --BiPAP  Volume overload: Bilateral pleural effusions, BNP elevated.  Lower extremity look okay.  Not surprising as do not expect to see RV failure.  Mostly left-sided disease anyway.  Pulmonary edema on chest x-ray to my eye.  Sounds like good urine output after first dose of Lasix. --IV Lasix given in ED, repeat dose at midnight (about 6 hours after initial dose. --Based  on exam, a.m. labs, would recommend additional IV Lasix in the morning as tolerated  -- Consider repeat echo, given decompensation would worry about worsening aortic valve stenosis  COVID-19 pneumonia: Bilateral pleural effusions, elevated  inflammatory markers, leukocytosis.  Chest x-ray with bilateral infiltrates although do suspect superimposed pulmonary edema/volume overload. --Dexamethasone 6 mg daily, continue remdesivir --If hypoxemia not markedly improving with positive pressure and diuresis, consider baricitinib or tocilizumab given degree of hypoxemia  CKD stage IIIa: Creatinine appears at baseline.  Diuresing.  CAD: Resume aspirin and Plavix given prior stent.  Has had stent placed in the past although most recent cath in 2019 only had POBA performed.  Hypertension: Hold home medications for now, resume as blood pressure allows.  Best Practice (right click and "Reselect all SmartList Selections" daily)   Diet/type: NPO w/ oral meds DVT prophylaxis: prophylactic heparin  GI prophylaxis: N/A Lines: N/A Foley:  N/A Code Status:  DNR Last date of multidisciplinary goals of care discussion [per TRH 8/21]  Labs   CBC: Recent Labs  Lab 01/16/21 1339  WBC 12.0*  HGB 10.7*  HCT 33.6*  MCV 79.4*  PLT Q000111Q    Basic Metabolic Panel: Recent Labs  Lab 01/16/21 1339  NA 130*  K 4.4  CL 100  CO2 23  GLUCOSE 112*  BUN 41*  CREATININE 1.54*  CALCIUM 8.8*   GFR: CrCl cannot be calculated (Unknown ideal weight.). Recent Labs  Lab 01/16/21 1339  PROCALCITON 10.40  WBC 12.0*    Liver Function Tests: Recent Labs  Lab 01/16/21 1339  AST 28  ALT 21  ALKPHOS 66  BILITOT 0.7  PROT 6.7  ALBUMIN 2.8*   No results for input(s): LIPASE, AMYLASE in the last 168 hours. No results for input(s): AMMONIA in the last 168 hours.  ABG No results found for: PHART, PCO2ART, PO2ART, HCO3, TCO2, ACIDBASEDEF, O2SAT   Coagulation Profile: No results for input(s): INR, PROTIME in the last 168 hours.  Cardiac Enzymes: No results for input(s): CKTOTAL, CKMB, CKMBINDEX, TROPONINI in the last 168 hours.  HbA1C: No results found for: HGBA1C  CBG: No results for input(s): GLUCAP in the last 168 hours.  Review of  Systems:   Unobtainable due to patient factors  Past Medical History:  He,  has a past medical history of Atypical angina ([place]) (06/02/2013), CAD S/P percutaneous coronary angioplasty (06/27/2007), Colon cancer ([place]) (1987), Coronary artery disease, Heart palpitations (05/16/2013), History of bacterial endocarditis (1980, 1990s), Hyperlipidemia, NSTEMI (non-ST elevated myocardial infarction) ([place]) (06/11/2017), OSA on CPAP, and Raynaud's disease (06/10/2013).   Surgical History:   Past Surgical History:  Procedure Laterality Date   APPENDECTOMY     BLADDER SURGERY     for bladder cancer   CARDIAC CATHETERIZATION  08/07/2005   40-50% narrowing in LAD, 30-40% scattered irregularity of diagonal, subtotal-total prox Cfx occlusion w/collaterals of marginal 1, prox 30-40% RCA narrowing, 40-50% distal RCA stenosis (Dr. Corky Downs)   CORONARY ANGIOPLASTY WITH STENT PLACEMENT  08/15/2005   stent to RCA and prox Cfx with 3x85m DES to AV groove Cfx and 2.5x13 Cypher DES to marginal (Dr. TCorky Downs   CORONARY STENT INTERVENTION N/A 06/13/2017   Procedure: CORONARY STENT INTERVENTION;  Surgeon: AWellington Hampshire MD;  [facility];  Service: Cardiovascular;  Laterality: N/A;   KNEE SURGERY  2004   right knee   LEFT HEART CATH AND CORONARY ANGIOGRAPHY N/A 06/13/2017   Procedure: LEFT HEART CATH AND CORONARY ANGIOGRAPHY;  Surgeon: AWellington Hampshire  MD;  Location: [facility];  Service: Cardiovascular;  Laterality: N/A;   NM MYOCAR PERF WALL MOTION  02/2012   lexiscan - normal perfusion, EF 66%, low risk    PROSTATE SURGERY     for cancer   SUBTOTAL COLECTOMY     in [place] r/t adenocarcinoma of colon    TRANSTHORACIC ECHOCARDIOGRAM  12/2010   EF=>55%, mild conc LVH; borderline LA enlargement; calcification of anterior MV leaflets, mild MR; mild TR; RVSP 30-79mHg; mild calcification of AV leaflets and mild-mod AV regurg; mild pulm regurg; aortic root sclerosis/calcification     Social  History:   reports that he has never smoked. He has never used smokeless tobacco. He reports current alcohol use. He reports that he does not use drugs.   Family History:  His family history is not on file.   Allergies No Known Allergies   Home Medications  Prior to Admission medications   Medication Sig Start Date End Date Taking? Authorizing Provider  amLODipine (NORVASC) 5 MG tablet Take 1 tablet (5 mg total) by mouth daily. 10/13/20  Yes KTroy Sine MD  aspirin 81 MG tablet Take 81 mg by mouth daily.   Yes [provider]  clopidogrel (PLAVIX) 75 MG tablet TAKE 1 TABLET BY MOUTH DAILY 01/06/21  Yes KTroy Sine MD  co-enzyme Q-10 30 MG capsule Take 30 mg by mouth daily.   Yes [provider]  isosorbide mononitrate (IMDUR) 30 MG 24 hr tablet TAKE ONE (1) TABLET EACH DAY 10/13/20  Yes KTroy Sine MD  levothyroxine (SYNTHROID) 50 MCG tablet Take 50 mcg by mouth daily. 03/08/20  Yes [provider]  Misc Natural Products (TURMERIC CURCUMIN) CAPS Take 1 capsule by mouth daily.   Yes [provider]  Pantothenic Acid (PANTO-250 PO) Take 1 capsule by mouth daily.   Yes [provider]  saw palmetto 160 MG capsule Take 160 mg by mouth daily.   Yes [provider]  tamsulosin (FLOMAX) 0.4 MG CAPS capsule Take 0.4 mg by mouth daily.   Yes [provider]  VYTORIN 10-20 MG tablet TAKE ONE (1) TABLET EACH DAY AT BEDTIME 09/16/15  Yes KTroy Sine MD  amLODipine (NORVASC) 2.5 MG tablet TAKE ONE (1) TABLET BY MOUTH EACH DAY 08/03/20   [provider]  cetirizine (ZYRTEC) 5 MG tablet Take 5 mg by mouth daily.    [provider]  Cholecalciferol (VITAMIN D3) 400 UNITS CAPS Take 1 capsule by mouth daily.    [provider]  doxycycline (VIBRAMYCIN) 100 MG capsule Take 100 mg by mouth 2 (two) times daily. 01/11/21   [provider]  ferrous sulfate 325 (65 FE) MG tablet Take 1 tablet by mouth  daily. 09/23/19   [provider]  fish oil-omega-3 fatty acids 1000 MG capsule Take 2 g by mouth daily.    [provider]  Glucosamine-Chondroit-Vit C-Mn (GLUCOSAMINE 1500 COMPLEX PO) Take 1 capsule by mouth daily.    [provider]  magnesium oxide (MAG-OX) 400 (240 Mg) MG tablet Take 2 tablets by mouth at bedtime. 01/10/21   [provider]  nitroGLYCERIN (NITROSTAT) 0.4 MG SL tablet DISSOLVE 1 TABLET UNDER TONGUE EVERY 5 MINUTES FOR UP TO 3 DOSES AS NEEDED FOR CHEST PAIN 08/30/20   KTroy Sine MD  NON FORMULARY CPAP therapy    [provider]  solifenacin (VESICARE) 10 MG tablet Take 10 mg by mouth daily. 12/24/20   [provider]  tolterodine (DETROL LA) 4 MG 24 hr capsule Take 4 mg by mouth 2 (two) times daily.    [provider]     Critical care time:     CRITICAL CARE Performed by: Lanier Clam   Total critical care time: 38 minutes  Critical care time was exclusive of separately billable procedures and treating other patients.  Critical care was necessary to treat or prevent imminent or life-threatening deterioration.  Critical care was time spent personally by me on the following activities: development of treatment plan with patient and/or surrogate as well as nursing, discussions with consultants, evaluation of patient's response to treatment, examination of patient, obtaining history from patient or surrogate, ordering and performing treatments and interventions, ordering and review of laboratory studies, ordering and review of radiographic studies, pulse oximetry and re-evaluation of patient's condition.

## 2021-01-16 NOTE — Plan of Care (Signed)

## 2021-01-16 NOTE — Assessment & Plan Note (Signed)
Chronic. 

## 2021-01-16 NOTE — Subjective & Objective (Signed)
CC: SOB HPI: 87 year old male with a prior history of colon cancer status post colostomy, coronary disease, hypertension, BPH presents to the ER today with worsening shortness of breath.  He was diagnosed with COVID on August 16.  He was given a Medrol Dosepak and doxycycline.  He returns to the ER today with continued shortness of breath.  He was in respiratory extremis.  Patient initially started out at 2 L/min nasal cannula oxygen and quickly escalated to 15 L with nonrebreather mask.  Patient in respiratory stress and cannot give any history review of systems.  Patient did present to the ER with a portable DNR.  He does verify that he is a DNR.  He states that he would not want to be intubated for respiratory distress or respiratory failure.  Evaluation the ER.  His COVID test was positive on the 16th.  Chest x-ray demonstrates widespread pulmonary infiltrates.  He also may have bilateral pleural effusions.  BNP was elevated at 1628.  CRP elevated 2.9.  Ferritin normal at 111.  LDH elevated 206.  Procalcitonin level elevated at 10.4.

## 2021-01-16 NOTE — ED Provider Notes (Addendum)
[facility] DEPT Provider Note   CSN: [id number] Arrival date & time: 01/16/21  1249     History Chief Complaint  Patient presents with   Shortness of Breath    Covid +    Anthony Petty is a 87 y.o. male.  Patient with recent dx covid 5 days ago (with symptom onset ~ 10 days ago) presents with increased sob and room air pulse ox in mid 80's. Pt w non prod cough. No chest pain. No leg swelling or pain. Symptoms acute onset, moderate, persistent, constant. Has been vaccinated and boosted for covid x 2. No nvd. No abd pain. No pleuritic pain.   The history is provided by the patient, the EMS personnel and medical records.  Shortness of Breath Associated symptoms: cough   Associated symptoms: no abdominal pain, no chest pain, no fever, no headaches, no neck pain, no rash and no sore throat       Past Medical History:  Diagnosis Date   Atypical angina ([place]) 06/02/2013   Pt has chest pain that sounds atypical but he also has exertional fatigue that sounds similar to his presentation in 2007.   CAD S/P percutaneous coronary angioplasty 06/27/2007   RCA and CFX DES 2007 Myoview normal 2015    Colon cancer ([place]) 1987   bladder cancer   Coronary artery disease    06/13/17 ISR in mRCA with cutting balloon, PCI/DESx1, patent stents in Lcx, with occluded in OM, collaterals to the LAD   Heart palpitations 05/16/2013   History of bacterial endocarditis 1980, 1990s   Hyperlipidemia    NSTEMI (non-ST elevated myocardial infarction) ([place]) 06/11/2017   OSA on CPAP    Raynaud's disease 06/10/2013    Patient Active Problem List   Diagnosis Date Noted   Bladder cancer metastasized to pelvic region [facility]) 09/12/2018   Abnormal nuclear stress test --intermediate risk with inferior ischemia 06/12/2017   Elevated troponin 06/12/2017   NSTEMI (non-ST elevated myocardial infarction) ([place]) 06/11/2017   Left carotid bruit 07/19/2016   OSA on CPAP 09/15/2015    Raynaud's disease 06/10/2013   Atypical angina ([place]) 06/02/2013   Bradycardia 05/16/2013   Heart palpitations 05/16/2013   Hyperlipidemia with target LDL less than 70 04/27/2013   History of colon cancer 06/27/2007   CAD S/P percutaneous coronary angioplasty 06/27/2007    Past Surgical History:  Procedure Laterality Date   APPENDECTOMY     BLADDER SURGERY     for bladder cancer   CARDIAC CATHETERIZATION  08/07/2005   40-50% narrowing in LAD, 30-40% scattered irregularity of diagonal, subtotal-total prox Cfx occlusion w/collaterals of marginal 1, prox 30-40% RCA narrowing, 40-50% distal RCA stenosis (Dr. Corky Downs)   CORONARY ANGIOPLASTY WITH STENT PLACEMENT  08/15/2005   stent to RCA and prox Cfx with 3x54m DES to AV groove Cfx and 2.5x13 Cypher DES to marginal (Dr. TCorky Downs   CORONARY STENT INTERVENTION N/A 06/13/2017   Procedure: CORONARY STENT INTERVENTION;  Surgeon: AWellington Hampshire MD;  [facility];  Service: Cardiovascular;  Laterality: N/A;   KNEE SURGERY  2004   right knee   LEFT HEART CATH AND CORONARY ANGIOGRAPHY N/A 06/13/2017   Procedure: LEFT HEART CATH AND CORONARY ANGIOGRAPHY;  Surgeon: AWellington Hampshire MD;  [facility];  Service: Cardiovascular;  Laterality: N/A;   NM MYOCAR PERF WALL MOTION  02/2012   lexiscan - normal perfusion, EF 66%, low risk    PROSTATE SURGERY  for cancer   SUBTOTAL COLECTOMY     in [place] r/t adenocarcinoma of colon    TRANSTHORACIC ECHOCARDIOGRAM  12/2010   EF=>55%, mild conc LVH; borderline LA enlargement; calcification of anterior MV leaflets, mild MR; mild TR; RVSP 30-73mHg; mild calcification of AV leaflets and mild-mod AV regurg; mild pulm regurg; aortic root sclerosis/calcification       No family history on file.  Social History   Tobacco Use   Smoking status: Never   Smokeless tobacco: Never  Vaping Use   Vaping Use: Never used  Substance Use Topics   Alcohol use: Yes     Alcohol/week: 0.0 standard drinks    Comment: ocassional glass of wine.   Drug use: No    Home Medications Prior to Admission medications   Medication Sig Start Date End Date Taking? Authorizing Provider  amLODipine (NORVASC) 5 MG tablet Take 1 tablet (5 mg total) by mouth daily. 10/13/20   KTroy Sine MD  aspirin 81 MG tablet Take 81 mg by mouth daily.    [provider]  cetirizine (ZYRTEC) 5 MG tablet Take 5 mg by mouth daily.    [provider]  Cholecalciferol (VITAMIN D3) 400 UNITS CAPS Take 1 capsule by mouth daily.    [provider]  clopidogrel (PLAVIX) 75 MG tablet TAKE 1 TABLET BY MOUTH DAILY 01/06/21   KTroy Sine MD  co-enzyme Q-10 30 MG capsule Take 30 mg by mouth daily.    [provider]  ferrous sulfate 325 (65 FE) MG tablet Take 1 tablet by mouth daily. 09/23/19   [provider]  fish oil-omega-3 fatty acids 1000 MG capsule Take 2 g by mouth daily.    [provider]  Glucosamine-Chondroit-Vit C-Mn (GLUCOSAMINE 1500 COMPLEX PO) Take 1 capsule by mouth daily.    [provider]  isosorbide mononitrate (IMDUR) 30 MG 24 hr tablet TAKE ONE (1) TABLET EACH DAY 10/13/20   KTroy Sine MD  levothyroxine (SYNTHROID) 50 MCG tablet Take 50 mcg by mouth daily. 03/08/20   [provider]  Misc Natural Products (TURMERIC CURCUMIN) CAPS Take 1 capsule by mouth daily.    [provider]  nitroGLYCERIN (NITROSTAT) 0.4 MG SL tablet DISSOLVE 1 TABLET UNDER TONGUE EVERY 5 MINUTES FOR UP TO 3 DOSES AS NEEDED FOR CHEST PAIN 08/30/20   KTroy Sine MD  NON FORMULARY CPAP therapy    [provider]  saw palmetto 160 MG capsule Take 160 mg by mouth daily.    [provider]  tolterodine (DETROL LA) 4 MG 24 hr capsule Take 4 mg by mouth 2 (two) times daily.    [provider]  VYTORIN 10-20 MG tablet TAKE ONE (1) TABLET EACH DAY AT BEDTIME 09/16/15   KTroy Sine MD     Allergies    Patient has no known allergies.  Review of Systems   Review of Systems  Constitutional:  Negative for fever.  HENT:  Negative for sore throat.   Eyes:  Negative for redness.  Respiratory:  Positive for cough and shortness of breath.   Cardiovascular:  Negative for chest pain and leg swelling.  Gastrointestinal:  Negative for abdominal pain.  Genitourinary:  Negative for flank pain.  Musculoskeletal:  Negative for back pain and neck pain.  Skin:  Negative for rash.  Neurological:  Negative for headaches.  Hematological:  Does not bruise/bleed easily.  Psychiatric/Behavioral:  Negative for confusion.    Physical Exam Updated Vital  Signs BP 97/60   Pulse (!) 21   Temp 98.1 F (36.7 C) (Oral)   Resp 18   SpO2 91%   Physical Exam Vitals and nursing note reviewed.  Constitutional:      Appearance: Normal appearance. He is well-developed.  HENT:     Head: Atraumatic.     Nose: Nose normal.     Mouth/Throat:     Mouth: Mucous membranes are moist.     Pharynx: Oropharynx is clear.  Eyes:     General: No scleral icterus.    Conjunctiva/sclera: Conjunctivae normal.  Neck:     Trachea: No tracheal deviation.  Cardiovascular:     Rate and Rhythm: Normal rate and regular rhythm.     Pulses: Normal pulses.     Heart sounds: Normal heart sounds. No murmur heard.   No friction rub. No gallop.  Pulmonary:     Effort: Pulmonary effort is normal. No accessory muscle usage or respiratory distress.     Breath sounds: Normal breath sounds.  Abdominal:     General: Bowel sounds are normal. There is no distension.     Palpations: Abdomen is soft.     Tenderness: There is no abdominal tenderness.  Genitourinary:    Comments: No cva tenderness. Musculoskeletal:        General: No swelling or tenderness.     Cervical back: Normal range of motion and neck supple. No rigidity.  Skin:    General: Skin is warm and dry.     Findings: No rash.  Neurological:     Mental  Status: He is alert.     Comments: Alert, speech clear.   Psychiatric:        Mood and Affect: Mood normal.    ED Results / Procedures / Treatments   Labs (all labs ordered are listed, but only abnormal results are displayed) Results for orders placed or performed during the hospital encounter of 01/16/21  CBC  Result Value Ref Range   WBC 12.0 (H) 4.0 - 10.5 K/uL   RBC 4.23 4.22 - 5.81 MIL/uL   Hemoglobin 10.7 (L) 13.0 - 17.0 g/dL   HCT 33.6 (L) 39.0 - 52.0 %   MCV 79.4 (L) 80.0 - 100.0 fL   MCH 25.3 (L) 26.0 - 34.0 pg   MCHC 31.8 30.0 - 36.0 g/dL   RDW 14.6 11.5 - 15.5 %   Platelets 314 150 - 400 K/uL   nRBC 0.0 0.0 - 0.2 %  Brain natriuretic peptide  Result Value Ref Range   B Natriuretic Peptide 1,628.3 (H) 0.0 - 100.0 pg/mL  Triglycerides  Result Value Ref Range   Triglycerides 79 <150 mg/dL  Fibrinogen  Result Value Ref Range   Fibrinogen 535 (H) 210 - 475 mg/dL      EKG EKG Interpretation  Date/Time:  Sunday January 16 2021 13:13:26 EDT Ventricular Rate:  77 PR Interval:  191 QRS Duration: 97 QT Interval:  405 QTC Calculation: 459 R Axis:   -3 Text Interpretation: Sinus rhythm Non-specific ST-t changes Confirmed by Lajean Saver [phone]) on 01/16/2021 1:37:44 PM  Radiology DG Chest Port 1 View  Result Date: 01/16/2021 CLINICAL DATA:  Increasing shortness of breath. History of colon carcinoma. Tested positive for COVID-19 3 days ago. EXAM: PORTABLE CHEST 1 VIEW COMPARISON:  08/23/2017 FINDINGS: Bilateral airspace lung opacities.  Possible small effusions. No pneumothorax. Cardiac silhouette normal in size.  No mediastinal or hilar masses. There are multiple areas of sclerosis in the visualized skeleton  involving the clavicles, scapula, proximal right humerus, vertebra and ribs. These are new from the prior study. IMPRESSION: 1. Bilateral airspace lung opacities consistent with multifocal pneumonia given the history of testing positive for COVID-19. 2. Multiple  bilateral sclerotic bone lesions consistent with metastatic disease. Patient's history reports colon carcinoma in 1987. The appearance of these lesions is more suggestive of metastatic prostate carcinoma. Electronically Signed   By: Lajean Manes M.D.   On: 01/16/2021 14:03    Procedures Procedures   Medications Ordered in ED Medications - No data to display  ED Course  I have reviewed the triage vital signs and the nursing notes.  Pertinent labs & imaging results that were available during my care of the patient were reviewed by me and considered in my medical decision making (see chart for details).    MDM Rules/Calculators/A&P                           Iv ns. Continuous pulse ox and cardiac monitoring. Stat labs and imaging.   Reviewed nursing notes and prior charts for additional history. Covid test 8/16 positive.   Siddhartha Dorsey was evaluated in Emergency Department on 01/16/2021 for the symptoms described in the history of present illness. He was evaluated in the context of the global COVID-19 pandemic, which necessitated consideration that the patient might be at risk for infection with the SARS-CoV-2 virus that causes COVID-19. Institutional protocols and algorithms that pertain to the evaluation of patients at risk for COVID-19 are in a state of rapid change based on information released by regulatory bodies including the CDC and federal and state organizations. These policies and algorithms were followed during the patient's care in the ED.  Labs reviewed/interpreted by me - wbc mildly high.   CXR reviewed/interpreted by me - bilateral infl ?covid pna  Room air pulse ox 84%, on 4 liters is 95%.   Hospitalists consulted for admission.  CRITICAL CARE RE: acute resp failure w hypoxia due to covid/covid pna, elevated bnp, soft bp, elevated troponin, ?AKI.  Performed by: Mirna Mires Total critical care time: 35 minutes Critical care time was exclusive of separately  billable procedures and treating other patients. Critical care was necessary to treat or prevent imminent or life-threatening deterioration. Critical care was time spent personally by me on the following activities: development of treatment plan with patient and/or surrogate as well as nursing, discussions with consultants, evaluation of patient's response to treatment, examination of patient, obtaining history from patient or surrogate, ordering and performing treatments and interventions, ordering and review of laboratory studies, ordering and review of radiographic studies, pulse oximetry and re-evaluation of patient's condition.  Pts ecg is changed from prior - pt denies any chest pain or discomfort. Trop is elev. Asa po. Continues to deny chest pain.   Hospitalist requests dose of decadron and remdesivir in ED - ordered.    Final Clinical Impression(s) / ED Diagnoses Final diagnoses:  None    Rx / DC Orders ED Discharge Orders     None          Lajean Saver, MD 01/16/21 1540

## 2021-01-16 NOTE — Assessment & Plan Note (Addendum)
--  has outpt foley

## 2021-01-16 NOTE — Progress Notes (Signed)
COVID pt placed on 15L [place] nasal cannula and 15L Non rebreather mask.  Sats 89-90%

## 2021-01-17 ENCOUNTER — Inpatient Hospital Stay (HOSPITAL_COMMUNITY): Payer: Medicare Other

## 2021-01-17 ENCOUNTER — Encounter (HOSPITAL_COMMUNITY): Payer: Self-pay | Admitting: Internal Medicine

## 2021-01-17 ENCOUNTER — Other Ambulatory Visit (HOSPITAL_COMMUNITY): Payer: Medicare Other

## 2021-01-17 DIAGNOSIS — R609 Edema, unspecified: Secondary | ICD-10-CM

## 2021-01-17 DIAGNOSIS — J1282 Pneumonia due to coronavirus disease 2019: Secondary | ICD-10-CM | POA: Diagnosis not present

## 2021-01-17 DIAGNOSIS — U071 COVID-19: Secondary | ICD-10-CM | POA: Diagnosis not present

## 2021-01-17 DIAGNOSIS — I5021 Acute systolic (congestive) heart failure: Secondary | ICD-10-CM | POA: Diagnosis not present

## 2021-01-17 DIAGNOSIS — J9601 Acute respiratory failure with hypoxia: Secondary | ICD-10-CM

## 2021-01-17 DIAGNOSIS — R778 Other specified abnormalities of plasma proteins: Secondary | ICD-10-CM

## 2021-01-17 LAB — ECHOCARDIOGRAM LIMITED
AR max vel: 1.59 cm2
AV Area VTI: 1.63 cm2
AV Area mean vel: 1.52 cm2
AV Mean grad: 13 mmHg
AV Peak grad: 24.1 mmHg
Ao pk vel: 2.46 m/s
Area-P 1/2: 5.54 cm2
Height: 67 in
MV M vel: 4.42 m/s
MV Peak grad: 78.1 mmHg
MV VTI: 2.28 cm2
P 1/2 time: 516 msec
Weight: 2208.13 oz

## 2021-01-17 LAB — CBC WITH DIFFERENTIAL/PLATELET
Abs Immature Granulocytes: 0.08 10*3/uL — ABNORMAL HIGH (ref 0.00–0.07)
Basophils Absolute: 0 10*3/uL (ref 0.0–0.1)
Basophils Relative: 0 %
Eosinophils Absolute: 0 10*3/uL (ref 0.0–0.5)
Eosinophils Relative: 0 %
HCT: 31 % — ABNORMAL LOW (ref 39.0–52.0)
Hemoglobin: 9.7 g/dL — ABNORMAL LOW (ref 13.0–17.0)
Immature Granulocytes: 1 %
Lymphocytes Relative: 29 %
Lymphs Abs: 2.3 10*3/uL (ref 0.7–4.0)
MCH: 25.5 pg — ABNORMAL LOW (ref 26.0–34.0)
MCHC: 31.3 g/dL (ref 30.0–36.0)
MCV: 81.6 fL (ref 80.0–100.0)
Monocytes Absolute: 0.2 10*3/uL (ref 0.1–1.0)
Monocytes Relative: 2 %
Neutro Abs: 5.5 10*3/uL (ref 1.7–7.7)
Neutrophils Relative %: 68 %
Platelets: 279 10*3/uL (ref 150–400)
RBC: 3.8 MIL/uL — ABNORMAL LOW (ref 4.22–5.81)
RDW: 14.7 % (ref 11.5–15.5)
WBC: 8 10*3/uL (ref 4.0–10.5)
nRBC: 0 % (ref 0.0–0.2)

## 2021-01-17 LAB — TROPONIN I (HIGH SENSITIVITY): Troponin I (High Sensitivity): 815 ng/L (ref <18)

## 2021-01-17 MED ORDER — ASCORBIC ACID 500 MG PO TABS
500.0000 mg | ORAL_TABLET | Freq: Every day | ORAL | Status: DC
  Administered 2021-01-17 – 2021-01-21 (×5): 500 mg via ORAL
  Filled 2021-01-17 (×5): qty 1

## 2021-01-17 MED ORDER — HYDROCOD POLST-CPM POLST ER 10-8 MG/5ML PO SUER: 5.0000 mL | Freq: Two times a day (BID) | ORAL | Status: DC | PRN

## 2021-01-17 MED ORDER — ZINC SULFATE 220 (50 ZN) MG PO CAPS
220.0000 mg | ORAL_CAPSULE | Freq: Every day | ORAL | Status: DC
  Administered 2021-01-17 – 2021-01-21 (×5): 220 mg via ORAL
  Filled 2021-01-17 (×5): qty 1

## 2021-01-17 MED ORDER — FUROSEMIDE 10 MG/ML IJ SOLN
40.0000 mg | Freq: Two times a day (BID) | INTRAMUSCULAR | Status: DC
  Administered 2021-01-17 – 2021-01-18 (×3): 40 mg via INTRAVENOUS
  Filled 2021-01-17 (×3): qty 4

## 2021-01-17 MED ORDER — ENOXAPARIN SODIUM 30 MG/0.3ML IJ SOSY: 30.0000 mg | PREFILLED_SYRINGE | INTRAMUSCULAR | Status: DC

## 2021-01-17 MED ORDER — HEPARIN BOLUS VIA INFUSION
3000.0000 [IU] | Freq: Once | INTRAVENOUS | Status: DC
  Filled 2021-01-17: qty 3000

## 2021-01-17 MED ORDER — ALBUMIN HUMAN 5 % IV SOLN
12.5000 g | Freq: Once | INTRAVENOUS | Status: AC
  Administered 2021-01-17: 12.5 g via INTRAVENOUS
  Filled 2021-01-17: qty 250

## 2021-01-17 MED ORDER — SODIUM CHLORIDE 0.9 % IV SOLN
2.0000 g | INTRAVENOUS | Status: DC
  Administered 2021-01-17 – 2021-01-19 (×3): 2 g via INTRAVENOUS
  Filled 2021-01-17 (×4): qty 20

## 2021-01-17 MED ORDER — HEPARIN (PORCINE) 25000 UT/250ML-% IV SOLN: 900.0000 [IU]/h | INTRAVENOUS | Status: DC

## 2021-01-17 MED ORDER — POTASSIUM CHLORIDE CRYS ER 20 MEQ PO TBCR
40.0000 meq | EXTENDED_RELEASE_TABLET | Freq: Once | ORAL | Status: AC
  Administered 2021-01-17: 40 meq via ORAL
  Filled 2021-01-17: qty 2

## 2021-01-17 MED ORDER — GUAIFENESIN-DM 100-10 MG/5ML PO SYRP: 10.0000 mL | ORAL_SOLUTION | ORAL | Status: DC | PRN

## 2021-01-17 MED ORDER — SODIUM CHLORIDE 0.9 % IV SOLN: INTRAVENOUS | Status: DC

## 2021-01-17 MED ORDER — SODIUM CHLORIDE 0.9 % IV SOLN
500.0000 mg | INTRAVENOUS | Status: DC
  Administered 2021-01-17 – 2021-01-19 (×3): 500 mg via INTRAVENOUS
  Filled 2021-01-17 (×4): qty 500

## 2021-01-17 MED ORDER — PERFLUTREN LIPID MICROSPHERE
1.0000 mL | INTRAVENOUS | Status: AC | PRN
  Administered 2021-01-17: 2 mL via INTRAVENOUS
  Filled 2021-01-17: qty 10

## 2021-01-17 MED ORDER — CARVEDILOL 3.125 MG PO TABS
3.1250 mg | ORAL_TABLET | Freq: Two times a day (BID) | ORAL | Status: DC
  Administered 2021-01-18 – 2021-01-21 (×7): 3.125 mg via ORAL
  Filled 2021-01-17 (×7): qty 1

## 2021-01-17 MED ORDER — CHLORHEXIDINE GLUCONATE CLOTH 2 % EX PADS
6.0000 | MEDICATED_PAD | Freq: Every day | CUTANEOUS | Status: DC
  Administered 2021-01-17 – 2021-01-20 (×3): 6 via TOPICAL

## 2021-01-17 MED ORDER — METHYLPREDNISOLONE SODIUM SUCC 125 MG IJ SOLR
60.0000 mg | INTRAMUSCULAR | Status: AC
Start: 2021-01-17 — End: 2021-01-19
  Administered 2021-01-17 – 2021-01-19 (×3): 60 mg via INTRAVENOUS
  Filled 2021-01-17 (×3): qty 2

## 2021-01-17 MED ORDER — PREDNISONE 50 MG PO TABS
50.0000 mg | ORAL_TABLET | Freq: Every day | ORAL | Status: DC
Start: 2021-01-20 — End: 2021-01-22
  Administered 2021-01-20 – 2021-01-21 (×2): 50 mg via ORAL
  Filled 2021-01-17 (×2): qty 1

## 2021-01-17 NOTE — Progress Notes (Signed)
Bilateral lower extremity venous duplex completed. Refer to "CV Proc" under chart review to view preliminary results.  01/17/2021 4:15 PM Kelby Aline., MHA, RVT, RDCS, RDMS

## 2021-01-17 NOTE — Progress Notes (Addendum)
eLink Physician-Brief Progress Note Patient Name: Anthony Petty DOB: June 07, 1927 MRN: [id number]   Date of Service  01/17/2021  HPI/Events of Note  RN discussed: About  progressively more and more bouts of small amount darker, redish blood in sputum. On asa/plavix. Low EF. PNA.  eICU Interventions  Will hold heparin and sq lovenox for now - get an EKG - trend troponin - VDS-neg for DVT - Cardiology consultation for elevated troponin/NSTEMI ?Marland Kitchen      Intervention Category Intermediate Interventions: Bleeding - evaluation and treatment with blood products  Elmer Sow 01/17/2021, 10:39 PM  1:07 EKG stable T down ant laterally. Sinus. Follow trop at 2 AM Discussed with RN.   3:44 Troponin stabilizing at 899, not doubling.  Follow trop level at 8 AM. Continue current care.   4:27  monitor showing brief SVT>,  Earlier 2 AM labs/lytes wnl. D dimer low, not rising. Watch for now. Not happening again.

## 2021-01-17 NOTE — Progress Notes (Signed)
[place] for heparin Indication: chest pain/ACS  No Known Allergies  Patient Measurements: Height: '5\' 7"'$  (170.2 cm) Weight: 62.6 kg (138 lb 0.1 oz) IBW/kg (Calculated) : 66.1 Heparin Dosing Weight: 63kg  Vital Signs: Temp: 98.6 F (37 C) (08/22 1800) Temp Source: Oral (08/22 1800) BP: 90/58 (08/22 2200) Pulse Rate: 54 (08/22 2200)  Labs: Recent Labs    01/16/21 1339 01/16/21 1600 01/16/21 1902 01/16/21 2141 01/17/21 0258 01/17/21 2042  HGB 10.7*  --  10.2*  --  9.7*  --   HCT 33.6*  --  32.0*  --  31.0*  --   PLT 314  --  280  --  279  --   CREATININE 1.54*  --  1.55* 1.56*  --   --   TROPONINIHS 954* 640*  --   --   --  815*     Estimated Creatinine Clearance: 27.3 mL/min (A) (by C-G formula based on SCr of 1.56 mg/dL (H)).    Assessment: Patient [name] a 87 y.o M with COVID-19 presented to the ED on 8/21 with c/o SOB. Troponin was found to be elevated. Pharmacy has been consulted to dose heparin drip for ACS.  - last dose of lovenox '30mg'$  daily for VTE px was given on 8/21 at 2114  Goal of Therapy:  Heparin level 0.3-0.7 units/ml Monitor platelets by anticoagulation protocol: Yes   Plan:  - d/c lovenox '30mg'$  daily - heparin 3000 units IV bolus x1, then 900 units/hr - check 8 hr heparin level - monitor for s/sx bleeding  Dolly Rias RPh 01/17/2021, 10:04 PM  ___________________________

## 2021-01-17 NOTE — Consult Note (Signed)
WOC Nurse Consult Note: Reason for Consult:Unstageable pressure injury noted to coccyx on admission. Wound type:pressure Pressure Injury POA: Yes Measurement:To be obtained by Bedside RN and documented on Nursing Flow Sheet Wound [id number], moist Drainage (amount, consistency, odor) scant yellow Periwound: intact, minor erythema Dressing procedure/placement/frequency: Wound is to be cleansed with NS and gently patted dry, then covered with a size appropriate piece of xeroform gauze. This is to be topped with a dry gauze, then secured with a silicone foam dressing.  Heels are to be floated. The patient is to be turned from side to side and time in the supine position minimized.   [place] Nurse ostomy consult note Stoma type/location: LLQ Colostomy Stomal assessment/size: Not measured today, pouch intact Peristomal assessment: Not seen today Treatment options for stomal/peristomal skin: Not seen today. Skin barrier ring provided Output: brown stool in small amount Ostomy pouching: 2pc. 2 and 3/4 inch ostomy pouching system, pouch is Lawson # [phone], skin barrier is [name] # 2 and skin barrier ring is [name] # [id number]. Education provided: None Enrolled patient in [place] program: No. Long standing ostomy and patient is established with a supplier.   [place] nursing team will not follow, but will remain available to this patient, the nursing and medical teams.  Please re-consult if needed. Thanks, Maudie Flakes, MSN, RN, [place], Arther Abbott  Pager# [phone]

## 2021-01-17 NOTE — Hospital Course (Addendum)
87 year old male PMH colon cancer status post colostomy, metastatic prostate cancer, diagnosed with COVID August 16, presented to the emergency department in the respiratory extremis with oxygen requirement quickly escalating to 15 L on nonrebreather.  Admitted for acute hypoxic respiratory failure secondary COVID-19 pneumonia, suspected CHF.  Echocardiogram revealed significant systolic dysfunction which is new.  In retrospect, presentation felt to be predominantly from NSTEMI and acute systolic CHF complicated by mild COVID and possible pneumonia.  Seen by pulmonology and cardiology.  Clinical condition improving.  Likely home in 72 hours if continues to improve.

## 2021-01-17 NOTE — Progress Notes (Signed)
*  PRELIMINARY RESULTS* Echocardiogram 2D Echocardiogram with Definity has been performed.  Luisa Hart RDCS 01/17/2021, 2:05 PM

## 2021-01-17 NOTE — Progress Notes (Signed)
Found PT off BiPAP and on 10 LPM Salter HFNC at approx 1630.

## 2021-01-17 NOTE — Progress Notes (Signed)
eLink Physician-Brief Progress Note Patient Name: Anthony Petty DOB: 12/14/1927 MRN: [id number]   Date of Service  01/17/2021  HPI/Events of Note  Trop up to 815 from earlier 640 Covid PNA/CHF Already on heparin gtt. Labs reviewed. Cr stable at 1.5 Mag 2.4  eICU Interventions  Discussed with bed side RN.  Will Rx as NSTEMI.ECHO low EF 35%. LAD. RSVP: 44  Resume heparin gtt. That was DC ed earlier.  Watch for hemoptysis. Follow Hg. Trend troponin. On asa.      Intervention Category Intermediate Interventions: Diagnostic test evaluation  Elmer Sow 01/17/2021, 9:51 PM

## 2021-01-17 NOTE — Progress Notes (Signed)
NAME:  Anthony Petty, MRN:  [id number], DOB:  1927-09-10, LOS: 1 ADMISSION DATE:  01/16/2021, CONSULTATION DATE:  01/17/21  REFERRING MD: TRH, CHIEF COMPLAINT: Shortness of breath  History of Present Illness:  87 year old whom we are seeing in consultation for evaluation of dyspnea exertion, severe hypoxemic respiratory failure in setting of COVID-19 infection.  ED note reviewed.  H&P reviewed.  History largely obtained via EMR given relative respiratory distress at time of evaluation.  Patient diagnosed with COVID 01/12/2020.  Was given steroids and doxycycline.  Unclear if got Paxlovid.  Despite these interventions, he developed worsening shortness of breath over the last several hours.  This prompted presentation to ED.  He was noted to have increased work of breathing hypoxemic.  Document O2 sats okay on 2 to 3 L at initial evaluation.  However due to reported oxygen desaturations and increased work of breathing his oxygen was escalated.  Up to 15 L salter with nonrebreather on top.  No heated high flow in the ED.  Labs notable for stable CKD with creatinine near baseline compared to recent labs in care everywhere.  He has hyponatremia to 130, BNP markedly elevated to 1600, troponin initially 900 downtrending to 600.  Inflammatory markers moderately elevated.  Chest x-ray performed on my review and interpretation shows diffuse bilateral alveolar filling and interstitial opacities with bilateral pleural effusions with element of pulmonary edema/volume overload with superimposed multifocal or atypical or viral pneumonia possible.  Given Decadron and remdesivir in the ED.  Discussed case with on-call [facility] provider.  Given patient is DNR, admit to stepdown under [place].  Advise giving Lasix given chest x-ray appearance and BNP on labs.  The ED nurse said patient had put out about 200 cc or more the first hour after Lasix.  Placed on BiPAP after arrival to the unit.  Marked improvement in work of breathing, O2  sats hit 100% shortly after placement.  Pertinent  Medical History  Diastolic dysfunction, LA dilation, mitral valve regurg, aortic valve regurgitation, aortic stenosis  Significant Hospital Events: Including procedures, antibiotic start and stop dates in addition to other pertinent events   8/21 admitted with hypoxemia due to COVID-pneumonia as well as volume overload  Interim History / Subjective:  On 13L HHFNC.  Breathing comfortably per RN.  Objective   Blood pressure 118/61, pulse 76, temperature 97.9 F (36.6 C), temperature source Axillary, resp. rate (!) 25, height '5\' 7"'$  (1.702 m), weight 62.6 kg, SpO2 93 %.    FiO2 (%):  [70 %-100 %] 70 %   Intake/Output Summary (Last 24 hours) at 01/17/2021 0915 Last data filed at 01/17/2021 0643 Gross per 24 hour  Intake 390.22 ml  Output 1450 ml  Net -1059.78 ml   Filed Weights   01/16/21 1816  Weight: 62.6 kg    Examination: General: elderly male, resting in bed, in NAD. Neuro: A&O x 3, no deficits. HEENT: [place]/AT. Sclerae anicteric. MM moist. Cardiovascular: RRR, soft murmur. Lungs: Respirations even and unlabored. Rhonchi bilaterally. Abdomen: Ostomy in place. BS x 4, soft, NT/ND.  Musculoskeletal: No gross deformities, no edema.  Skin: Intact, warm, no rashes. GU: [facility] Problem list     Assessment & Plan:   Acute hypoxemic respiratory failure: Suspect multifactorial related to COVID-19 pneumonia as well as volume overload.  Given relatively mild disease with most recent variance, suspect volume overload is the biggest contributor. - Continue supplemental O2 as needed to maintain SpO2 > 88%. - BiPAP PRN.  Volume overload: Bilateral pleural effusions, BNP elevated.  Lower extremity look okay.  Not surprising as do not expect to see RV failure.  Mostly left-sided disease anyway.  Pulmonary edema on chest x-ray to my eye.  Had good urine output after first 2 doses of Lasix. - Additional '40mg'$  BID Lasix  today and repeat as needed based on his response. - Consider repeat echo, given decompensation would worry about worsening aortic valve stenosis.  COVID-19 pneumonia: Chest x-ray with bilateral infiltrates although do suspect superimposed pulmonary edema/volume overload. - Continue steroids. - If hypoxemia not markedly improving with positive pressure and diuresis, consider baricitinib or tocilizumab given degree of hypoxemia.   Rest per primary team.  Continue supportive care as already ordered.  DNR in place. Nothing further to add.  PCCM will sign off.  Please call us back if we can be of any further assistance.   Montey Hora, [facility] For pager details, please see AMION or use Epic chat  After 1900, please call [facility] for cross coverage needs 01/17/2021, 9:28 AM

## 2021-01-17 NOTE — Progress Notes (Signed)
*  PRELIMINARY RESULTS* Echocardiogram 2D Echocardiogram has been performed.  Anthony Petty 01/17/2021, 2:51 PM

## 2021-01-17 NOTE — Progress Notes (Signed)
Found PT off BiPAP and 15 LPM [place] HFNC- decreased to 13 LPM. PT states he is breathing ok at this time- does not appear to be in distress at this time.

## 2021-01-17 NOTE — Assessment & Plan Note (Signed)
--   May be demand ischemia given severe respiratory distress on admission, however given rapid clinical improvement, concern for new cardiac dysfunction, will check echocardiogram. -- Given lack of clear symptoms of ACS, hold off on heparin

## 2021-01-17 NOTE — TOC Initial Note (Signed)
Transition of Care [facility]) - Initial/Assessment Note    Patient Details  Name: Anthony Petty MRN: [id number] Date of Birth: 29-Jan-1928  Transition of Care [facility]) CM/SW Contact:    Leeroy Cha, RN Phone Number: 01/17/2021, 8:34 AM  Clinical Narrative:                 87 year old male with a prior history of colon cancer status post colostomy, coronary disease, hypertension, BPH presents to the ER today with worsening shortness of breath.  He was diagnosed with COVID on August 16.  He was given a Medrol Dosepak and doxycycline.  He returns to the ER today with continued shortness of breath.  He was in respiratory extremis.  Patient initially started out at 2 L/min nasal cannula oxygen and quickly escalated to 15 L with nonrebreather mask.  Patient in respiratory stress and cannot give any history review of systems.  Patient did present to the ER with a portable DNR.  He does verify that he is a DNR.  He states that he would not want to be intubated for respiratory distress or respiratory failure.   Evaluation the ER.  His COVID test was positive on the 16th.  Chest x-ray demonstrates widespread pulmonary infiltrates.  He also may have bilateral pleural effusions.  BNP was elevated at 1628.  CRP elevated 2.9.  Ferritin normal at 111.  LDH elevated 206.  Procalcitonin level elevated at 10.4.    ED Course: given IV decadron and IV remdesivir in ER. TOC PLAN OF CARE: From home with spouse, plan is to return to home with self care.  Progression Covid Positive, will follow for hhc needs. Expected Discharge Plan: Home/Self Care Barriers to Discharge: Continued Medical Work up   Patient Goals and CMS Choice        Expected Discharge Plan and Services Expected Discharge Plan: Home/Self Care   Discharge Planning Services: CM Consult   Living arrangements for the past 2 months: Apartment                                      Prior Living Arrangements/Services Living  arrangements for the past 2 months: Apartment Lives with:: Spouse Patient language and need for interpreter reviewed:: Yes Do you feel safe going back to the place where you live?: Yes            Criminal Activity/Legal Involvement Pertinent to Current Situation/Hospitalization: No - Comment as needed  Activities of Daily Living      Permission Sought/Granted                  Emotional Assessment Appearance:: Appears stated age     Orientation: : Oriented to Self, Oriented to Place, Oriented to  Time, Oriented to Situation Alcohol / Substance Use: Not Applicable Psych Involvement: No (comment)  Admission diagnosis:  Elevated brain natriuretic peptide (BNP) level [R79.89] Acute respiratory failure with hypoxia ([place]) [J96.01] COVID-19 virus infection [U07.1] Pneumonia due to COVID-19 virus [U07.1, J12.82] Patient Active Problem List   Diagnosis Date Noted   Pneumonia due to COVID-19 virus 01/16/2021   Acute hypoxemic respiratory failure due to COVID-19 ([place]) 01/16/2021   S/P colostomy ([place]) 01/16/2021   DNR (do not resuscitate)/DNI(Do Not Intubate) 01/16/2021   Bladder cancer metastasized to pelvic region ([place]) 09/12/2018   Abnormal nuclear stress test --intermediate risk with inferior ischemia 06/12/2017   Elevated troponin 06/12/2017   NSTEMI (  non-ST elevated myocardial infarction) ([place]) 06/11/2017   Left carotid bruit 07/19/2016   Colostomy present ([place]) 12/29/2015   Benign prostatic hyperplasia with urinary obstruction 12/21/2015   Benign essential hypertension 12/21/2015   OSA on CPAP 09/15/2015   Raynaud's disease 06/10/2013   Atypical angina ([place]) 06/02/2013   Bradycardia 05/16/2013   Heart palpitations 05/16/2013   Hyperlipidemia with target LDL less than 70 04/27/2013   History of colon cancer 06/27/2007   CAD S/P percutaneous coronary angioplasty 06/27/2007   PCP:  Javier Glazier, MD Pharmacy:   [place], [place] - [address] [address] Phone: [phone] Fax: [phone]  [place], [place] [address] Phone: [phone] Fax: [phone]     Social Determinants of Health (SDOH) Interventions    Readmission Risk Interventions No flowsheet data found.

## 2021-01-17 NOTE — Progress Notes (Signed)
eLink Physician-Brief Progress Note Patient Name: Anthony Petty DOB: 04/22/1928 MRN: [id number]   Date of Service  01/17/2021  HPI/Events of Note  Elink to do hand off to check labs BMP/Mag.   eICU Interventions  BMP and mag is stable,  Continue care.      Intervention Category Minor Interventions: Other:  Elmer Sow 01/17/2021, 12:52 AM

## 2021-01-17 NOTE — Progress Notes (Addendum)
ANTICOAGULATION CONSULT NOTE   Pharmacy Consult for heparin Indication: chest pain/ACS  No Known Allergies  Patient Measurements: Height: '5\' 7"'$  (170.2 cm) Weight: 62.6 kg (138 lb 0.1 oz) IBW/kg (Calculated) : 66.1 Heparin Dosing Weight: 63kg  Vital Signs: Temp: 97.9 F (36.6 C) (08/22 1230) Temp Source: Oral (08/22 1230) BP: 138/107 (08/22 1700) Pulse Rate: 73 (08/22 1700)  Labs: Recent Labs    01/16/21 1339 01/16/21 1600 01/16/21 1902 01/16/21 2141 01/17/21 0258  HGB 10.7*  --  10.2*  --  9.7*  HCT 33.6*  --  32.0*  --  31.0*  PLT 314  --  280  --  279  CREATININE 1.54*  --  1.55* 1.56*  --   TROPONINIHS 954* 640*  --   --   --     Estimated Creatinine Clearance: 27.3 mL/min (A) (by C-G formula based on SCr of 1.56 mg/dL (H)).    Assessment: Patient [name] a 87 y.o M with COVID-19 presented to the ED on 8/21 with c/o SOB. Troponin was found to be elevated. Pharmacy has been consulted to dose heparin drip for ACS.  - last dose of lovenox '30mg'$  daily for VTE px was given on 8/21 at 2114  Goal of Therapy:  Heparin level 0.3-0.7 units/ml Monitor platelets by anticoagulation protocol: Yes   Plan:  - d/c lovenox '30mg'$  daily - heparin 3000 units IV bolus x1, then 900 units/hr - check 8 hr heparin level - monitor for s/sx bleeding  Anthony Petty 01/17/2021,6:10 PM  ___________________________  Adden: heparin protocol d/ced by Dr. Sarajane Jews - will d/c heparin order and resume lovevnox '30mg'$  daily  Anthony Petty, PharmD, BCPS 01/17/2021 6:24 PM

## 2021-01-17 NOTE — Progress Notes (Signed)
PROGRESS NOTE  Anthony Petty [id number] DOB: [birth year] DOA: 01/16/2021 PCP: Javier Glazier, MD  Brief History   87 year old male PMH colon cancer status post colostomy, metastatic prostate cancer, diagnosed with COVID August 16, presented to the emergency department in the respiratory extremis with oxygen requirement quickly escalating to 15 L on nonrebreather.  Admitted for acute hypoxic respiratory failure secondary COVID-19 pneumonia, suspected CHF.  A & P  * Pneumonia due to COVID-19 virus -- Much better today, down to 5 L high flow nasal cannula. -- Given clinical improvement hold off on Actemra/baricitinib, continue remdesivir and steroid, vitamins.  Wean oxygen as tolerated.  Acute hypoxemic respiratory failure due to COVID-19 [facility]) -- As per admit COVID-pneumonia -- Given elevated troponin, BNP check echocardiogram to assess EF and aortic valve.  Elevated troponin -- May be demand ischemia given severe respiratory distress on admission, however given rapid clinical improvement, concern for new cardiac dysfunction, will check echocardiogram. -- Given lack of clear symptoms of ACS, hold off on heparin  Benign prostatic hyperplasia with urinary obstruction --has outpt foley  CAD S/P percutaneous coronary angioplasty --Asymptomatic.  Continue ASA and imdur.  Benign essential hypertension Stable.  S/P colostomy (HCC) Chronic.  Nutritional Assessment: Body mass index is 21.62 kg/m.Marland Kitchen Seen by dietician.  I agree with the assessment and plan as outlined below: Nutrition Status:     Skin Assessment: I have examined the patient's skin and I agree with the wound assessment as performed by the wound care RN as outlined below: Pressure Injury 01/16/21 Coccyx Medial Unstageable - Full thickness tissue loss in which the base of the injury is covered by slough (yellow, tan, gray, green or brown) and/or eschar (tan, brown or black) in the wound bed. (Active)  01/16/21  1914  Location: Coccyx  Location Orientation: Medial  Staging: Unstageable - Full thickness tissue loss in which the base of the injury is covered by slough (yellow, tan, gray, green or brown) and/or eschar (tan, brown or black) in the wound bed.  Wound Description (Comments):   Present on Admission: Yes   Disposition Plan:  Discussion: Appears better than described yesterday, wean oxygen, continue current treatments and work-up as above.  Status is: Inpatient  Remains inpatient appropriate because:IV treatments appropriate due to intensity of illness or inability to take PO and Inpatient level of care appropriate due to severity of illness  Dispo: The patient is from: ALF              Anticipated d/c is to: ALF              Patient currently is not medically stable to d/c.   Difficult to place patient No  DVT prophylaxis: enoxaparin (LOVENOX) injection 30 mg Start: 01/17/21 2200 SCDs Start: 01/16/21 1746   Code Status: DNR Level of care: Stepdown Family Communication: nephew Estanislado Emms by telephone  Murray Hodgkins, MD  [facility] Direct contact: see [email or web address] (further directions at bottom of note if needed) 7PM-7AM contact night coverage as at bottom of note 01/17/2021, 10:28 AM  LOS: 1 day   Significant Hospital Events   8/21 admit for COVID, CHF?   Consults:  PCCM   Procedures:  None  Significant Diagnostic Tests:  CXR multifocal pneumonia, bilateral sclerotic bone lesions consistent with metastatic disease.   Micro Data:  MRSA PCR-   Antimicrobials:  none  Interval History/Subjective  CC: f/u SOB  Breathing better today No CP, but had chest pressure and SOB at home for several days  Coughing up sputum w/ blood Breathing better today  Objective   Vitals:  Vitals:   01/17/21 0814 01/17/21 0900  BP:  109/64  Pulse: 76 68  Resp: (!) 25 20  Temp:  97.6 F (36.4 C)  SpO2: 93% 98%    Exam: Physical Exam Vitals reviewed.  Constitutional:       General: He is not in acute distress.    Appearance: He is not ill-appearing or toxic-appearing.  Cardiovascular:     Rate and Rhythm: Normal rate and regular rhythm.     Heart sounds: No murmur heard.   No friction rub. No gallop.     Comments: Telemetry SR Pulmonary:     Effort: Pulmonary effort is normal. No respiratory distress.     Breath sounds: Normal breath sounds. No wheezing, rhonchi or rales.  Abdominal:     General: Abdomen is flat. There is no distension.     Palpations: Abdomen is soft.     Tenderness: There is no abdominal tenderness.     Comments: Left-sided colostomy  Musculoskeletal:     Right lower leg: No edema.     Left lower leg: No edema.  Neurological:     Mental Status: He is alert.  Psychiatric:        Mood and Affect: Mood normal.        Behavior: Behavior normal.   I have personally reviewed the labs and other data, making special note of:   Today's Data  Na+ stable at 130 Creatinine stable 1.56 Hgb stable 9.7   Scheduled Meds:  vitamin C  500 mg Oral Daily   aspirin EC  81 mg Oral Daily   Chlorhexidine Gluconate Cloth  6 each Topical Daily   clopidogrel  75 mg Oral Daily   enoxaparin (LOVENOX) injection  30 mg Subcutaneous Q24H   furosemide  40 mg Intravenous BID   isosorbide mononitrate  30 mg Oral Daily   methylPREDNISolone (SOLU-MEDROL) injection  60 mg Intravenous Q24H   Followed by   Derrill Memo ON 01/20/2021] predniSONE  50 mg Oral Daily   potassium chloride  40 mEq Oral Once   zinc sulfate  220 mg Oral Daily   Continuous Infusions:  remdesivir 100 mg in NS 100 mL Stopped (01/17/21 0950)    Principal Problem:   Pneumonia due to COVID-19 virus Active Problems:   Acute hypoxemic respiratory failure due to COVID-19 (HCC)   Elevated troponin   CAD S/P percutaneous coronary angioplasty   Benign prostatic hyperplasia with urinary obstruction   S/P colostomy ([place])   DNR (do not resuscitate)/DNI(Do Not Intubate)   LOS: 1 day    How to contact the [facility] Attending or Consulting provider 7A - 7P or covering provider during after hours [place], for this patient?  Check the care team in [facility] and look for a) attending/consulting TRH provider listed and b) the [facility] team listed Log into [email or web address] and use [place]'s universal password to access. If you do not have the password, please contact the hospital operator. Locate the [facility] provider you are looking for under [facility] and page to a number that you can be directly reached. If you still have difficulty reaching the provider, please page the [facility] (Director on Call) for the Hospitalists listed on amion for assistance.

## 2021-01-17 NOTE — Progress Notes (Signed)
PT demonstrated hands on understanding of Flutter device. 

## 2021-01-18 DIAGNOSIS — Z9861 Coronary angioplasty status: Secondary | ICD-10-CM

## 2021-01-18 DIAGNOSIS — I420 Dilated cardiomyopathy: Secondary | ICD-10-CM

## 2021-01-18 DIAGNOSIS — R778 Other specified abnormalities of plasma proteins: Secondary | ICD-10-CM

## 2021-01-18 DIAGNOSIS — J189 Pneumonia, unspecified organism: Secondary | ICD-10-CM | POA: Diagnosis not present

## 2021-01-18 DIAGNOSIS — I214 Non-ST elevation (NSTEMI) myocardial infarction: Secondary | ICD-10-CM | POA: Diagnosis not present

## 2021-01-18 DIAGNOSIS — I5021 Acute systolic (congestive) heart failure: Secondary | ICD-10-CM

## 2021-01-18 DIAGNOSIS — I2510 Atherosclerotic heart disease of native coronary artery without angina pectoris: Secondary | ICD-10-CM

## 2021-01-18 DIAGNOSIS — J1282 Pneumonia due to coronavirus disease 2019: Secondary | ICD-10-CM | POA: Diagnosis not present

## 2021-01-18 DIAGNOSIS — U071 COVID-19: Secondary | ICD-10-CM | POA: Diagnosis not present

## 2021-01-18 DIAGNOSIS — J9601 Acute respiratory failure with hypoxia: Secondary | ICD-10-CM | POA: Diagnosis not present

## 2021-01-18 LAB — TROPONIN I (HIGH SENSITIVITY)
Troponin I (High Sensitivity): 899 ng/L (ref <18)
Troponin I (High Sensitivity): 747 ng/L (ref <18)
Troponin I (High Sensitivity): 727 ng/L (ref <18)

## 2021-01-18 LAB — CBC
HCT: 37.2 % — ABNORMAL LOW (ref 39.0–52.0)
Hemoglobin: 12.2 g/dL — ABNORMAL LOW (ref 13.0–17.0)
MCH: 26 pg (ref 26.0–34.0)
MCHC: 32.8 g/dL (ref 30.0–36.0)
MCV: 79.1 fL — ABNORMAL LOW (ref 80.0–100.0)
Platelets: 339 10*3/uL (ref 150–400)
RBC: 4.7 MIL/uL (ref 4.22–5.81)
RDW: 15.4 % (ref 11.5–15.5)
WBC: 13.5 10*3/uL — ABNORMAL HIGH (ref 4.0–10.5)
nRBC: 0 % (ref 0.0–0.2)

## 2021-01-18 LAB — CBC WITH DIFFERENTIAL/PLATELET
Abs Immature Granulocytes: 0.12 10*3/uL — ABNORMAL HIGH (ref 0.00–0.07)
Basophils Absolute: 0 10*3/uL (ref 0.0–0.1)
Basophils Relative: 0 %
Eosinophils Absolute: 0 10*3/uL (ref 0.0–0.5)
Eosinophils Relative: 0 %
HCT: 30.8 % — ABNORMAL LOW (ref 39.0–52.0)
Hemoglobin: 9.9 g/dL — ABNORMAL LOW (ref 13.0–17.0)
Immature Granulocytes: 1 %
Lymphocytes Relative: 22 %
Lymphs Abs: 2.6 10*3/uL (ref 0.7–4.0)
MCH: 25.6 pg — ABNORMAL LOW (ref 26.0–34.0)
MCHC: 32.1 g/dL (ref 30.0–36.0)
MCV: 79.6 fL — ABNORMAL LOW (ref 80.0–100.0)
Monocytes Absolute: 0.6 10*3/uL (ref 0.1–1.0)
Monocytes Relative: 6 %
Neutro Abs: 8.2 10*3/uL — ABNORMAL HIGH (ref 1.7–7.7)
Neutrophils Relative %: 71 %
Platelets: 369 10*3/uL (ref 150–400)
RBC: 3.87 MIL/uL — ABNORMAL LOW (ref 4.22–5.81)
RDW: 15.2 % (ref 11.5–15.5)
WBC: 11.6 10*3/uL — ABNORMAL HIGH (ref 4.0–10.5)
nRBC: 0 % (ref 0.0–0.2)

## 2021-01-18 LAB — LIPID PANEL
Cholesterol: 124 mg/dL (ref 0–200)
HDL: 32 mg/dL — ABNORMAL LOW (ref >40)
LDL Cholesterol: 77 mg/dL (ref 0–99)
Total CHOL/HDL Ratio: 3.9 RATIO
Triglycerides: 75 mg/dL (ref <150)
VLDL: 15 mg/dL (ref 0–40)

## 2021-01-18 LAB — COMPREHENSIVE METABOLIC PANEL
ALT: 14 U/L (ref 0–44)
AST: 29 U/L (ref 15–41)
Albumin: 2.5 g/dL — ABNORMAL LOW (ref 3.5–5.0)
Alkaline Phosphatase: 51 U/L (ref 38–126)
Anion gap: 8 (ref 5–15)
BUN: 47 mg/dL — ABNORMAL HIGH (ref 8–23)
CO2: 22 mmol/L (ref 22–32)
Calcium: 8.6 mg/dL — ABNORMAL LOW (ref 8.9–10.3)
Chloride: 105 mmol/L (ref 98–111)
Creatinine, Ser: 1.59 mg/dL — ABNORMAL HIGH (ref 0.61–1.24)
GFR, Estimated: 41 mL/min — ABNORMAL LOW (ref >60)
Glucose, Bld: 121 mg/dL — ABNORMAL HIGH (ref 70–99)
Potassium: 4.6 mmol/L (ref 3.5–5.1)
Sodium: 135 mmol/L (ref 135–145)
Total Bilirubin: 0.9 mg/dL (ref 0.3–1.2)
Total Protein: 5.9 g/dL — ABNORMAL LOW (ref 6.5–8.1)

## 2021-01-18 LAB — PHOSPHORUS: Phosphorus: 3.9 mg/dL (ref 2.5–4.6)

## 2021-01-18 LAB — PROCALCITONIN: Procalcitonin: 8.25 ng/mL

## 2021-01-18 LAB — LACTIC ACID, PLASMA: Lactic Acid, Venous: 1.2 mmol/L (ref 0.5–1.9)

## 2021-01-18 LAB — PSA: Prostatic Specific Antigen: 0.82 ng/mL (ref 0.00–4.00)

## 2021-01-18 LAB — MAGNESIUM: Magnesium: 2.2 mg/dL (ref 1.7–2.4)

## 2021-01-18 LAB — C-REACTIVE PROTEIN: CRP: 2.3 mg/dL — ABNORMAL HIGH (ref <1.0)

## 2021-01-18 LAB — LEGIONELLA PNEUMOPHILA SEROGP 1 UR AG: L. pneumophila Serogp 1 Ur Ag: NEGATIVE

## 2021-01-18 LAB — STREP PNEUMONIAE URINARY ANTIGEN: Strep Pneumo Urinary Antigen: NEGATIVE

## 2021-01-18 LAB — D-DIMER, QUANTITATIVE: D-Dimer, Quant: 0.94 ug/mL-FEU — ABNORMAL HIGH (ref 0.00–0.50)

## 2021-01-18 MED ORDER — POTASSIUM CHLORIDE CRYS ER 20 MEQ PO TBCR
20.0000 meq | EXTENDED_RELEASE_TABLET | Freq: Once | ORAL | Status: AC
  Administered 2021-01-18: 20 meq via ORAL
  Filled 2021-01-18: qty 1

## 2021-01-18 MED ORDER — FUROSEMIDE 10 MG/ML IJ SOLN
40.0000 mg | Freq: Once | INTRAMUSCULAR | Status: AC
  Administered 2021-01-18: 40 mg via INTRAVENOUS
  Filled 2021-01-18: qty 4

## 2021-01-18 MED ORDER — HYDRALAZINE HCL 10 MG PO TABS
10.0000 mg | ORAL_TABLET | Freq: Three times a day (TID) | ORAL | Status: DC
  Administered 2021-01-18 – 2021-01-21 (×7): 10 mg via ORAL
  Filled 2021-01-18 (×9): qty 1

## 2021-01-18 NOTE — Progress Notes (Signed)
PT states the BiPAP machine "is not for me." PT is currently on 2 LPM nasal cannula (Sp02 96%)- just titrated from 3 LPM. PT does not appear to be in respiratory distress at this time. CCM PA aware.

## 2021-01-18 NOTE — Assessment & Plan Note (Signed)
--   In retrospect, this is likely predominant reason for his presentation -- Asymptomatic.  Given recurrent hemoptysis, heparin stopped.  Continue aspirin, carvedilol, hydralazine, Imdur

## 2021-01-18 NOTE — Assessment & Plan Note (Signed)
--  suspected based on procalcitonin, hypoxia --continue empiric abx

## 2021-01-18 NOTE — Progress Notes (Signed)
NAME:  Anthony Petty, MRN:  [id number], DOB:  10-12-1927, LOS: 2 ADMISSION DATE:  01/16/2021, CONSULTATION DATE:  01/18/21  REFERRING MD: [facility], CHIEF COMPLAINT: Shortness of breath  History of Present Illness:  87 year old whom we are seeing in consultation for evaluation of dyspnea exertion, severe hypoxemic respiratory failure in setting of COVID-19 infection.  ED note reviewed.  H&P reviewed.  History largely obtained via EMR given relative respiratory distress at time of evaluation.  Patient diagnosed with COVID 01/12/2020.  Was given steroids and doxycycline.  Unclear if got Paxlovid.  Despite these interventions, he developed worsening shortness of breath over the last several hours.  This prompted presentation to ED.  He was noted to have increased work of breathing hypoxemic.  Document O2 sats okay on 2 to 3 L at initial evaluation.  However due to reported oxygen desaturations and increased work of breathing his oxygen was escalated.  Up to 15 L salter with nonrebreather on top.  No heated high flow in the ED.  Labs notable for stable CKD with creatinine near baseline compared to recent labs in care everywhere.  He has hyponatremia to 130, BNP markedly elevated to 1600, troponin initially 900 downtrending to 600.  Inflammatory markers moderately elevated.  Chest x-ray performed on my review and interpretation shows diffuse bilateral alveolar filling and interstitial opacities with bilateral pleural effusions with element of pulmonary edema/volume overload with superimposed multifocal or atypical or viral pneumonia possible.  Given Decadron and remdesivir in the ED.  Discussed case with on-call [facility] provider.  Given patient is DNR, admit to stepdown under [place].  Advise giving Lasix given chest x-ray appearance and BNP on labs.  The ED nurse said patient had put out about 200 cc or more the first hour after Lasix.  Placed on BiPAP after arrival to the unit.  Marked improvement in work of breathing, O2  sats hit 100% shortly after placement.  Pertinent  Medical History  Diastolic dysfunction, LA dilation, mitral valve regurg, aortic valve regurgitation, aortic stenosis  Significant Hospital Events: Including procedures, antibiotic start and stop dates in addition to other pertinent events   8/21 admitted with hypoxemia due to COVID-pneumonia as well as volume overload  Interim History / Subjective:  Down to 2L O2. Breathing comfortably per RN. Giving me thumbs up sign. Only tolerated BiPAP for 2 hours overnight then got uncomfortable and requested to not be placed back on it. Prelim LE duplex neg.    Objective   Blood pressure 126/72, pulse 73, temperature 97.7 F (36.5 C), temperature source Oral, resp. rate (!) 30, height '5\' 7"'$  (1.702 m), weight 63.1 kg, SpO2 97 %.        Intake/Output Summary (Last 24 hours) at 01/18/2021 0836 Last data filed at 01/18/2021 0400 Gross per 24 hour  Intake 643.19 ml  Output 3285 ml  Net -2641.81 ml    Filed Weights   01/16/21 1816 01/18/21 0450  Weight: 62.6 kg 63.1 kg    Examination: General: elderly male, resting in bed watching TV, in NAD. Neuro: MAE's HEENT: [place]/AT.  Cardiovascular: RRR, soft murmur per RN. Lungs: Respirations even and unlabored. Rhonchi bilaterally per RN. Abdomen: Ostomy in place. BS x 4, soft, NT/ND per RN.   Resolved Hospital Problem list     Assessment & Plan:   Acute hypoxemic respiratory failure: Suspect multifactorial related to COVID-19 pneumonia as well as volume overload.  Given relatively mild disease with most recent variance, suspect volume overload along with concomitant bacterial PNA (  high PCT of 8) are the biggest contributors (volume overload much improved now, see below). - Continue supplemental O2 as needed to maintain SpO2 > 88%. - BiPAP PRN though he has asked to not have this placed any further. - Continue CAP coverage (started 8/22). - Continue steroids and Remdesivir per protocol. - No  role for Baricitinib or Tocilizumab given his improvement in O2 requirements.  Volume overload: great response to diuresis, now -3.7L net since admit.  Echo with combined heart failure and elevated LA pressures (EF 30-35%, G2DD). - Will do another one dose of '40mg'$  Lasix.  Can likely hold steady thereafter but will repeat CXR AM 8/24 for comparison.  NSTEMI. - Heparin on hold due to blood tinged sputum. - Continue ASA, Plavix, Imdur, Carvedilol. - Cards following, will discuss with pt regarding goals of care as if wishes for aggressive measures, he may require cardiac cath as he likely has worsened CAD (has hx of this).   Rest per primary team.  Continue supportive care as already ordered.  DNR in place.  He has had a lot of improvement with diuresis and suspect he will continue to improve especially with CAP abx therapy addition.  He has declined further BiPAP which is probably acceptable.  In light of not wanting BiPAP, he could be transferred to med surge with continued supportive care. Ultimate dispo depends on decision per cardiology with pt and family regarding plan of care (cath versus medical management only, etc).  Nothing further to add.  PCCM will sign off.  Please call us back if we can be of any further assistance.   Montey Hora, [facility] For pager details, please see AMION or use Epic chat  After 1900, please call [facility] for cross coverage needs 01/18/2021, 8:36 AM

## 2021-01-18 NOTE — Progress Notes (Signed)
Called by the Pt's RN regarding the Pt wanting to take BiPAP off.  Pt has already removed mask several times only to have it placed back on by RN.   Pt does not want to wear BiPAP mask any longer and instead wear his nasal cannula.  Will speak with Pt again regarding BiPAP usage should he show signs of decompensation.

## 2021-01-18 NOTE — Progress Notes (Signed)
PROGRESS NOTE  Anthony Petty [id number] DOB: 07-06-1927 DOA: 01/16/2021 PCP: Javier Glazier, MD  Brief History   87 year old male PMH colon cancer status post colostomy, metastatic prostate cancer, diagnosed with COVID August 16, presented to the emergency department in the respiratory extremis with oxygen requirement quickly escalating to 15 L on nonrebreather.  Admitted for acute hypoxic respiratory failure secondary COVID-19 pneumonia, suspected CHF.  Echocardiogram revealed significant systolic dysfunction which is new.  In retrospect, presentation felt to be predominantly from NSTEMI and acute systolic CHF complicated by mild COVID and possible pneumonia.  Seen by pulmonology and cardiology.  Clinical condition improving.  Likely home in 72 hours if continues to improve.  A & P  * Pneumonia due to COVID-19 virus -- Continues to improve, I think this is mild at this point, he is down to 3 L. -- Given clinical improvement no indication for Actemra/baricitinib, but will continue continue remdesivir and steroid, vitamins.  Continue to wean oxygen as tolerated.  Acute systolic CHF (congestive heart failure) (HCC) -- Now asymptomatic, oxygen down to 3 L, no evidence of gross volume overload. -- New diagnosis, newly depressed EF secondary to NSTEMI -- Continue diuresis and management as per cardiology  NSTEMI (non-ST elevated myocardial infarction) ([place]) -- In retrospect, this is likely predominant reason for his presentation -- Asymptomatic.  Given recurrent hemoptysis, heparin stopped.  Continue aspirin, carvedilol, hydralazine, Imdur  CAP (community acquired pneumonia) --suspected based on procalcitonin, hypoxia --continue empiric abx  Acute hypoxemic respiratory failure due to COVID-19 [facility]) -- Multifactorial, COVID, possible bacterial pneumonia, acute systolic heart failure --Continue antibiotics, COVID treatment, treatment directed CHF   Benign prostatic hyperplasia with  urinary obstruction --has outpt foley  CAD S/P percutaneous coronary angioplasty --Asymptomatic.  Continue ASA, coreg and imdur.  Benign essential hypertension Stable.  S/P colostomy (HCC) Chronic.  Elevated troponin -- May be demand ischemia given severe respiratory distress on admission, however given rapid clinical improvement, concern for new cardiac dysfunction, will check echocardiogram. -- Given lack of clear symptoms of ACS, hold off on heparin  Nutritional Assessment: Body mass index is 21.79 kg/m.[place] Kitchen Seen by dietician.  I agree with the assessment and plan as outlined below: Nutrition Status:     Skin Assessment: I have examined the patient's skin and I agree with the wound assessment as performed by the wound care RN as outlined below: Pressure Injury 01/16/21 Coccyx Medial Unstageable - Full thickness tissue loss in which the base of the injury is covered by slough (yellow, tan, gray, green or brown) and/or eschar (tan, brown or black) in the wound bed. (Active)  01/16/21 1914  Location: Coccyx  Location Orientation: Medial  Staging: Unstageable - Full thickness tissue loss in which the base of the injury is covered by slough (yellow, tan, gray, green or brown) and/or eschar (tan, brown or black) in the wound bed.  Wound Description (Comments):   Present on Admission: Yes   Disposition Plan:  Discussion: Appears better than described yesterday, wean oxygen, continue current treatments and work-up as above.  Status is: Inpatient  Remains inpatient appropriate because:IV treatments appropriate due to intensity of illness or inability to take PO and Inpatient level of care appropriate due to severity of illness  Dispo: The patient is from: ALF              Anticipated d/c is to: ALF              Patient currently is not medically stable to d/c.  Difficult to place patient No  DVT prophylaxis: heparin bolus via infusion 3,000 Units Start: 01/17/21 2300 SCDs  Start: 01/16/21 1746   Code Status: DNR Level of care: Progressive Family Communication: nephew Estanislado Emms by telephone  Murray Hodgkins, MD  [facility] Direct contact: see [email or web address] (further directions at bottom of note if needed) 7PM-7AM contact night coverage as at bottom of note 01/18/2021, 11:04 AM  LOS: 2 days   Significant Hospital Events   8/21 admit for COVID, CHF?   Consults:  PCCM   Procedures:  None  Significant Diagnostic Tests:  CXR multifocal pneumonia, bilateral sclerotic bone lesions consistent with metastatic disease.   Micro Data:  MRSA PCR-   Antimicrobials:  none  Interval History/Subjective  CC: f/u SOB   Discussed with RN, no issues overnight other than very low volume hemoptysis, heparin stopped, down to 3 L nasal cannula  Patient feels well, coughing somewhat, breathing fine, no chest pressure now.  He did have significant chest pressure at home.  Objective   Vitals:  Vitals:   01/18/21 1000 01/18/21 1015  BP: 91/66 91/66  Pulse: 63   Resp: 20   Temp:    SpO2: 97%     Exam: Physical Exam Vitals reviewed.  Constitutional:      General: He is not in acute distress.    Appearance: He is not ill-appearing or toxic-appearing.  Cardiovascular:     Rate and Rhythm: Normal rate and regular rhythm.     Heart sounds: No murmur heard.    Comments: Telemetry SR, brief SVT Pulmonary:     Effort: Pulmonary effort is normal. No respiratory distress.     Breath sounds: Normal breath sounds. No wheezing, rhonchi or rales.  Musculoskeletal:     Right lower leg: No edema.     Left lower leg: No edema.  Neurological:     Mental Status: He is alert.  Psychiatric:        Mood and Affect: Mood normal.        Behavior: Behavior normal.   I have personally reviewed the labs and other data, making special note of:   Today's Data  Troponin 747 Hgb 12.2 CMP noted Creatinine stable 1.59   Scheduled Meds:  vitamin C  500 mg Oral  Daily   aspirin EC  81 mg Oral Daily   carvedilol  3.125 mg Oral BID WC   Chlorhexidine Gluconate Cloth  6 each Topical Daily   clopidogrel  75 mg Oral Daily   heparin  3,000 Units Intravenous Once   hydrALAZINE  10 mg Oral Q8H   isosorbide mononitrate  30 mg Oral Daily   methylPREDNISolone (SOLU-MEDROL) injection  60 mg Intravenous Q24H   Followed by   Derrill Memo ON 01/20/2021] predniSONE  50 mg Oral Daily   zinc sulfate  220 mg Oral Daily   Continuous Infusions:  azithromycin Stopped (01/17/21 1752)   cefTRIAXone (ROCEPHIN)  IV Stopped (01/17/21 1648)   heparin Stopped (01/17/21 2311)   remdesivir 100 mg in NS 100 mL Stopped (01/18/21 1022)    Principal Problem:   Pneumonia due to COVID-19 virus Active Problems:   NSTEMI (non-ST elevated myocardial infarction) ([place])   Acute systolic CHF (congestive heart failure) ([place])   Acute hypoxemic respiratory failure due to COVID-19 ([place])   CAP (community acquired pneumonia)   CAD S/P percutaneous coronary angioplasty   Benign prostatic hyperplasia with urinary obstruction   S/P colostomy ([place])   DNR (do not resuscitate)/DNI(Do Not Intubate)  LOS: 2 days   How to contact the [facility] Attending or Consulting provider [place] or covering provider during after hours [place], for this patient?  Check the care team in [facility] and look for a) attending/consulting [facility] provider listed and b) the [facility] team listed Log into [email or web address] and use [place]'s universal password to access. If you do not have the password, please contact the hospital operator. Locate the [facility] provider you are looking for under [facility] and page to a number that you can be directly reached. If you still have difficulty reaching the provider, please page the [facility] (Director on Call) for the Hospitalists listed on amion for assistance.

## 2021-01-18 NOTE — Consult Note (Signed)
Cardiology Consultation:   Patient ID: Anthony Petty MRN: [id number]; DOB: Dec 10, 1927  Admit date: 01/16/2021 Date of Consult: 01/18/2021  PCP:  Anthony Glazier, MD   [facility] Providers Cardiologist:  Anthony Majestic, MD   {   Patient Profile:   Anthony Petty is a 87 y.o. 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 Sarajane Petty.  History of CAD with RCA stent in 2007. Last cardiac catheterization (after intermediate risk stress test) was completed on 06/13/2017 which revealed in-stent restenosis of the mid RCA.  Patient underwent successful cutting balloon with PCI drug-eluting stent to the mid RCA.  Was noted that there was no residual disease in other vessels, with patent stents in the left circumflex with occluded stent in the OM 2 with well-developed collaterals from the LAD.  There was noted to be moderate posterior LAD stenosis at the bifurcation of the first diagonal but this was to be treated medically.   History of Present Illness:   Anthony Petty diagnosed with COVID-19 on August 16.  He was treated with steroids and doxycycline.  However presented back on 21st with worsening shortness of breath.  He was in respiratory distress on arrival requiring 15 L with nonrebreather mask.  Acute hypoxic respiratory failure secondary to COVID-19 pneumonia>> treated with antibiotic and remdesivir.  He was also noted to have volume overloaded with bilateral pleural effusion.  Treated with Lasix 40 mg IV twice daily.  Net -3.7 L.  Serum creatinine stable at 1.5  Echocardiogram yesterday showed newly reduced LV function to 30 to 35% (EF was 60 to 65% on 11/11/20),  regional wall motion abnormality, grade 2 diastolic dysfunction with elevated left atrial pressure.  RVSP 44 mmHg.  Mild aortic regurgitation.  Mild to moderate aortic stenosis.   Ref. Range 01/16/2021 13:39 01/16/2021 16:00 01/17/2021 20:42 01/18/2021 02:24  Troponin I (High Sensitivity) Latest Ref Range: <18 ng/L 954 (HH) 640 (HH) 815 (HH) 899 (HH)   Plan was to start IV heparin however held secondary to hemoptysis.  Cardiology is asked for further evaluation.  Past Medical History:  Diagnosis Date   Atypical angina ([place]) 06/02/2013   Pt has chest pain that sounds atypical but he also has exertional fatigue that sounds similar to his presentation in 2007.   CAD S/P percutaneous coronary angioplasty 06/27/2007   RCA and CFX DES 2007 Myoview normal 2015    Colon cancer ([place]) 1987   bladder cancer   Coronary artery disease    06/13/17 ISR in mRCA with cutting balloon, PCI/DESx1, patent stents in Lcx, with occluded in OM, collaterals to the LAD   Heart palpitations 05/16/2013   History of bacterial endocarditis 1980, 1990s   Hyperlipidemia    NSTEMI (non-ST elevated myocardial infarction) ([place]) 06/11/2017   OSA on CPAP    Raynaud's disease 06/10/2013    Past Surgical History:  Procedure Laterality Date   APPENDECTOMY     BLADDER SURGERY     for bladder cancer   CARDIAC CATHETERIZATION  08/07/2005   40-50% narrowing in LAD, 30-40% scattered irregularity of diagonal, subtotal-total prox Cfx occlusion w/collaterals of marginal 1, prox 30-40% RCA narrowing, 40-50% distal RCA stenosis (Dr. Corky Downs)   CORONARY ANGIOPLASTY WITH STENT PLACEMENT  08/15/2005   stent to RCA and prox Cfx with 3x44m DES to AV groove Cfx and 2.5x13 Cypher DES to marginal (Dr. TCorky Downs   CORONARY STENT INTERVENTION N/A 06/13/2017   Procedure: CORONARY STENT INTERVENTION;  Surgeon: AWellington Hampshire MD;  [facility];  Service: Cardiovascular;  Laterality: N/A;   KNEE SURGERY  2004   right knee   LEFT  HEART CATH AND CORONARY ANGIOGRAPHY N/A 06/13/2017   Procedure: LEFT HEART CATH AND CORONARY ANGIOGRAPHY;  Surgeon: AWellington Hampshire MD;  [facility];  Service: Cardiovascular;  Laterality: N/A;   NM MYOCAR PERF WALL MOTION  02/2012   lexiscan - normal perfusion, EF 66%, low risk    PROSTATE SURGERY     for cancer   SUBTOTAL COLECTOMY     in [place]/t adenocarcinoma of colon    TRANSTHORACIC ECHOCARDIOGRAM  12/2010   EF=>55%, mild conc LVH; borderline LA enlargement; calcification of anterior MV leaflets, mild MR; mild TR; RVSP 30-467mg; mild calcification of AV leaflets and mild-mod AV regurg; mild pulm regurg; aortic root sclerosis/calcification       Inpatient Medications: Scheduled Meds:  vitamin C  500 mg Oral Daily   aspirin EC  81 mg Oral Daily   carvedilol  3.125 mg Oral BID WC   Chlorhexidine Gluconate Cloth  6 each Topical Daily   clopidogrel  75 mg Oral Daily   furosemide  40 mg Intravenous BID   heparin  3,000 Units Intravenous Once   isosorbide mononitrate  30 mg Oral Daily   methylPREDNISolone (SOLU-MEDROL) injection  60 mg Intravenous Q24H   Followed by   [SDerrill MemoN 01/20/2021] predniSONE  50 mg Oral Daily   zinc sulfate  220 mg Oral Daily   Continuous Infusions:  azithromycin Stopped (01/17/21 1752)   cefTRIAXone (ROCEPHIN)  IV Stopped (01/17/21 1648)   heparin Stopped (01/17/21 2311)   remdesivir 100 mg in NS 100 mL Stopped (01/17/21 0949)   PRN Meds: acetaminophen **OR** acetaminophen, albuterol, chlorpheniramine-HYDROcodone, guaiFENesin-dextromethorphan, ondansetron **OR** ondansetron (ZOFRAN) IV  Allergies:   No Known Allergies  Social History:   Social History   Socioeconomic History   Marital status: Married    Spouse name: Not on file   Number of children: Not on file   Years of education: Not on file   Highest education level: Not on file  Occupational History   Not on file  Tobacco Use   Smoking status: Never   Smokeless  tobacco: Never  Vaping Use   Vaping Use: Never used  Substance and Sexual Activity   Alcohol use: Yes    Alcohol/week: 0.0 standard drinks    Comment: ocassional glass of wine.   Drug use: No   Sexual activity: Not on file  Other Topics Concern   Not on file  Social History Narrative   Not on file   Social Determinants of Health   Financial Resource Strain: Not on file  Food Insecurity: Not on file  Transportation Needs: Not on file  Physical Activity: Not on file  Stress: Not on file  Social Connections: Not on file  Intimate Partner Violence: Not on file    Family History:   History reviewed. No pertinent family history.   ROS:  Please see the history of present illness.  All other ROS reviewed and negative.     Physical Exam/Data:   Vitals:   01/18/21 0630 01/18/21 0700 01/18/21 0800 01/18/21 0817  BP: 120/64 (!) 123/52  126/72   Pulse: 66 74 72 73  Resp: 15 (!) 22 (!) 21 (!) 30  Temp:    97.7 F (36.5 C)  TempSrc:    Oral  SpO2: 99% 97% 99% 97%  Weight:      Height:        Intake/Output Summary (Last 24 hours) at 01/18/2021 0859 Last data filed at 01/18/2021 0400 Gross per 24 hour  Intake 643.19 ml  Output 3285 ml  Net -2641.81 ml   Last 3 Weights 01/18/2021 01/16/2021 10/12/2020  Weight (lbs) 139 lb 1.8 oz 138 lb 0.1 oz 142 lb  Weight (kg) 63.1 kg 62.6 kg 64.411 kg     Body mass index is 21.79 kg/m.  General: Elderly ill appearing male in no acute distress HEENT: normal Lymph: no adenopathy Neck: no JVD Endocrine:  No thryomegaly Vascular: No carotid bruits; FA pulses 2+ bilaterally without bruits  Cardiac:  normal S1, S2; RRR; systolic murmur  Lungs: diminished breath sound with rhonchi  Abd: soft, nontender, no hepatomegaly  Ext: no edema Musculoskeletal:  No deformities, BUE and BLE strength normal and equal Skin: warm and dry  Neuro:  CNs 2-12 intact, no focal abnormalities noted Psych:  Normal affect   EKG:  The EKG was personally reviewed  and demonstrates:  NSR with ST up slopping in leads V3-V5 Telemetry:  Telemetry was personally reviewed and demonstrates:  sinus rhythm, PVCs, 13 beats of NSVT  Relevant CV Studies:  Echo 11/11/2020  1. Left ventricular ejection fraction, by estimation, is 60 to 65%. The  left ventricle has normal function. The left ventricle has no regional  wall motion abnormalities. Left ventricular diastolic parameters are  consistent with Grade I diastolic  dysfunction (impaired relaxation). The average left ventricular global  longitudinal strain is -18.5 %. The global longitudinal strain is normal.   2. Right ventricular systolic function is normal. The right ventricular  size is normal.   3. Left atrial size was mildly dilated.   4. The mitral valve is normal in structure. Trivial mitral valve  regurgitation. No evidence of mitral stenosis.   5. The aortic valve is calcified. Aortic valve regurgitation is mild.  Moderate aortic valve stenosis.   6. Aortic dilatation noted. There is mild dilatation of the aortic root,  measuring 40 mm. There is mild dilatation of the ascending aorta,  measuring 42 mm.   7. The inferior vena cava is normal in size with greater than 50%  respiratory variability, suggesting right atrial pressure of 3 mmHg.   Cath 05/2017 CORONARY STENT INTERVENTION  LEFT HEART CATH AND CORONARY ANGIOGRAPHY   Conclusion    The left ventricular systolic function is normal. LV end diastolic pressure is normal. The left ventricular ejection fraction is greater than 65% by visual estimate. Prox RCA to Mid RCA lesion is 95% stenosed. Prox Cx to Dist Cx lesion is 20% stenosed. Ost 2nd Mrg to 2nd Mrg lesion is 100% stenosed. Prox LAD lesion is 60% stenosed. Ost 1st Diag to 1st Diag lesion is 75% stenosed. Ost 2nd Diag to 2nd Diag lesion is 80% stenosed. A drug-eluting stent was successfully placed using a STENT SYNERGY DES 3X16. Post intervention, there is a 0% residual  stenosis.   1.  Severe two-vessel coronary artery disease.  Severe focal in-stent restenosis in the proximal right coronary artery (only in the proximal portion of the stent).  Patent stents in the left circumflex with occluded stent in OM 2 with well-developed collaterals from the LAD.  Moderate proximal LAD stenosis at the bifurcation of first diagonal. 2.  Hyperdynamic LV systolic function with normal left ventricular end-diastolic pressure. 3.  Successful cutting balloon angioplasty and drug-eluting stent placement to the proximal right coronary artery for severe in-stent restenosis.   Recommendations: Dual antiplatelet therapy for at least one year.  Aggressive treatment of risk factors.  Treat the rest of coronary artery disease medically.  Diagnostic Dominance: Right Intervention   Laboratory Data:  High Sensitivity Troponin:   Recent Labs  Lab 01/16/21 1339 01/16/21 1600 01/17/21 2042 01/18/21 0224  TROPONINIHS 954* 640* 815* 899*     Chemistry Recent Labs  Lab 01/16/21 1339 01/16/21 1902 01/16/21 2141 01/18/21 0224  NA 130*  --  130* 135  K 4.4  --  4.7 4.6  CL 100  --  101 105  CO2 23  --  20* 22  GLUCOSE 112*  --  144* 121*  BUN 41*  --  41* 47*  CREATININE 1.54* 1.55* 1.56* 1.59*  CALCIUM 8.8*  --  8.4* 8.6*  GFRNONAA 42* 42* 42* 41*  ANIONGAP 7  --  9 8    Recent Labs  Lab 01/16/21 1339 01/18/21 0224  PROT 6.7 5.9*  ALBUMIN 2.8* 2.5*  AST 28 29  ALT 21 14  ALKPHOS 66 51  BILITOT 0.7 0.9   Hematology Recent Labs  Lab 01/16/21 1902 01/17/21 0258 01/18/21 0224  WBC 12.9* 8.0 11.6*  RBC 3.98* 3.80* 3.87*  HGB 10.2* 9.7* 9.9*  HCT 32.0* 31.0* 30.8*  MCV 80.4 81.6 79.6*  MCH 25.6* 25.5* 25.6*  MCHC 31.9 31.3 32.1  RDW 14.6 14.7 15.2  PLT 280 279 369   BNP Recent Labs  Lab 01/16/21 1339  BNP 1,628.3*    DDimer  Recent Labs  Lab 01/18/21 0224  DDIMER 0.94*     Radiology/Studies:  DG Chest Port 1 View  Result Date:  01/16/2021 CLINICAL DATA:  Increasing shortness of breath. History of colon carcinoma. Tested positive for COVID-19 3 days ago. EXAM: PORTABLE CHEST 1 VIEW COMPARISON:  08/23/2017 FINDINGS: Bilateral airspace lung opacities.  Possible small effusions. No pneumothorax. Cardiac silhouette normal in size.  No mediastinal or hilar masses. There are multiple areas of sclerosis in the visualized skeleton involving the clavicles, scapula, proximal right humerus, vertebra and ribs. These are new from the prior study. IMPRESSION: 1. Bilateral airspace lung opacities consistent with multifocal pneumonia given the history of testing positive for COVID-19. 2. Multiple bilateral sclerotic bone lesions consistent with metastatic disease. Patient's history reports colon carcinoma in 1987. The appearance of these lesions is more suggestive of metastatic prostate carcinoma. Electronically Signed   By: Lajean Manes M.D.   On: 01/16/2021 14:03   VAS [name] LOWER EXTREMITY VENOUS (DVT)  Result Date: 01/17/2021  Lower Venous DVT Study Patient Name:  Anthony Petty  Date of Exam:   01/17/2021 Medical Rec #: [id number]          Accession #:    [id number] Date of Birth: [birth year]          Patient Gender: M Patient Age:   36 years Exam [facility]  [facility] Procedure:      VAS [name] LOWER EXTREMITY VENOUS (DVT) Referring Phys: MURALI RAMASWAMY --------------------------------------------------------------------------------  Indications: Edema, and COVID.  Comparison Study: No prior study Performing Technologist: Maudry Mayhew MHA, RDMS, RVT, RDCS  Examination Guidelines: A complete evaluation includes B-mode imaging, spectral Doppler, color Doppler, and power Doppler as needed of all accessible portions of  each vessel. Bilateral testing is considered an integral part of a complete examination. Limited examinations for reoccurring indications may be performed as noted. The reflux portion of the exam is performed with the  patient in reverse Trendelenburg.  +---------+---------------+---------+-----------+----------+--------------+ RIGHT    CompressibilityPhasicitySpontaneityPropertiesThrombus Aging +---------+---------------+---------+-----------+----------+--------------+ CFV      Full           Yes      Yes                                 +---------+---------------+---------+-----------+----------+--------------+ SFJ      Full                                                        +---------+---------------+---------+-----------+----------+--------------+ FV Prox  Full                                                        +---------+---------------+---------+-----------+----------+--------------+ FV Mid   Full                                                        +---------+---------------+---------+-----------+----------+--------------+ FV DistalFull                                                        +---------+---------------+---------+-----------+----------+--------------+ PFV      Full                                                        +---------+---------------+---------+-----------+----------+--------------+ POP      Full           Yes      Yes                                 +---------+---------------+---------+-----------+----------+--------------+ PTV      Full                                                        +---------+---------------+---------+-----------+----------+--------------+ PERO     Full                                                        +---------+---------------+---------+-----------+----------+--------------+   +---------+---------------+---------+-----------+----------+--------------+ LEFT     CompressibilityPhasicitySpontaneityPropertiesThrombus  Aging +---------+---------------+---------+-----------+----------+--------------+ CFV      Full           Yes      Yes                                  +---------+---------------+---------+-----------+----------+--------------+ SFJ      Full                                                        +---------+---------------+---------+-----------+----------+--------------+ FV Prox  Full                                                        +---------+---------------+---------+-----------+----------+--------------+ FV Mid   Full                                                        +---------+---------------+---------+-----------+----------+--------------+ FV DistalFull                                                        +---------+---------------+---------+-----------+----------+--------------+ PFV      Full                                                        +---------+---------------+---------+-----------+----------+--------------+ POP      Full           Yes      Yes                                 +---------+---------------+---------+-----------+----------+--------------+ PTV      Full                                                        +---------+---------------+---------+-----------+----------+--------------+ PERO     Full                                                        +---------+---------------+---------+-----------+----------+--------------+     Summary: RIGHT: - There is no evidence of deep vein thrombosis in the lower extremity.  - No cystic structure found in the popliteal fossa.  LEFT: - There is no evidence of deep vein thrombosis in the lower extremity.  - No cystic structure found in the popliteal  fossa.  *See table(s) above for measurements and observations.    Preliminary    ECHOCARDIOGRAM LIMITED  Result Date: 01/17/2021    ECHOCARDIOGRAM LIMITED REPORT   Patient Name:   Anthony Petty Date of Exam: 01/17/2021 Medical Rec #:  [id number]         Height:       67.0 in Accession #:    [id number]        Weight:       138.0 lb Date of Birth:  August 01, 1927         BSA:           1.727 m Patient Age:    9 years          BP:           109/64 mmHg Patient Gender: M                 HR:           69 bpm. Exam Location:  Inpatient Procedure: Limited Echo, Limited Color Doppler, Cardiac Doppler and Intracardiac            Opacification Agent  Results communicated to Dr Anthony Petty at 17:18 on 01/17/21. Indications:     AV and EF check only                  SOB and Covid  History:         Patient has prior history of Echocardiogram examinations, most                  recent 11/11/2020. CAD and Previous Myocardial Infarction,                  Arrythmias:Bradycardia, Signs/Symptoms:Murmur; Risk                  Factors:Dyslipidemia and Hypertension.  Sonographer:     Luisa Hart RDCS Referring Phys:  [place] Diagnosing Phys: Oswaldo Milian MD  Sonographer Comments: Technically challenging study due to limited acoustic windows. IMPRESSIONS  1. Left ventricular ejection fraction, by estimation, is 30 to 35%. The left ventricle has moderately decreased function. The left ventricle demonstrates regional wall motion abnormalities (see scoring diagram/findings for description). Left ventricular  diastolic parameters are consistent with Grade II diastolic dysfunction (pseudonormalization). Elevated left atrial pressure.  2. Right ventricular systolic function is normal. The right ventricular size is normal. There is mildly elevated pulmonary artery systolic pressure. The estimated right ventricular systolic pressure is 123XX123 mmHg.  3. Left atrial size was moderately dilated.  4. The mitral valve is degenerative. Mild mitral valve regurgitation. No evidence of mitral stenosis.  5. The aortic valve was not well visualized. Aortic valve regurgitation is mild. Mild to moderate aortic valve stenosis. Vmax 2.5 m/s, MG 14 mmHg, AVA 1.6 cm^2, DI 0.37  6. Aortic dilatation noted. There is mild dilatation of the aortic root, measuring 41 mm.  7. The inferior vena cava is normal in size with greater  than 50% respiratory variability, suggesting right atrial pressure of 3 mmHg. Comparison(s): Compared to prior echo [id number], LV systolic function is reduced with new regional wall motion abnormalities. FINDINGS  Left Ventricle: Left ventricular ejection fraction, by estimation, is 30 to 35%. The left ventricle has moderately decreased function. The left ventricle demonstrates regional wall motion abnormalities. Definity contrast agent was given IV to delineate the left ventricular endocardial borders. Left ventricular diastolic parameters are consistent with Grade II diastolic dysfunction (pseudonormalization). Elevated  left atrial pressure.  LV Wall Scoring: The mid and distal anterior septum, entire apex, and mid inferoseptal segment are akinetic. The mid anterior segment and mid inferior segment are hypokinetic. The antero-lateral wall, posterior wall, basal anteroseptal segment, basal anterior segment, basal inferior segment, and basal inferoseptal segment are normal. Right Ventricle: The right ventricular size is normal. No increase in right ventricular wall thickness. Right ventricular systolic function is normal. There is mildly elevated pulmonary artery systolic pressure. The tricuspid regurgitant velocity is 3.20  m/s, and with an assumed right atrial pressure of 3 mmHg, the estimated right ventricular systolic pressure is 123XX123 mmHg. Left Atrium: Left atrial size was moderately dilated. Right Atrium: Right atrial size was normal in size. Pericardium: Trivial pericardial effusion is present. Mitral Valve: The mitral valve is degenerative in appearance. Mild mitral valve regurgitation. No evidence of mitral valve stenosis. MV peak gradient, 5.2 mmHg. The mean mitral valve gradient is 2.0 mmHg. Aortic Valve: The aortic valve was not well visualized. Aortic valve regurgitation is mild. Aortic regurgitation PHT measures 516 msec. Mild to moderate aortic stenosis is present. Aortic valve mean gradient measures  13.0 mmHg. Aortic valve peak gradient  measures 24.1 mmHg. Aortic valve area, by VTI measures 1.63 cm. Aorta: The aortic root is normal in size and structure and aortic dilatation noted. There is mild dilatation of the aortic root, measuring 41 mm. Venous: The inferior vena cava is normal in size with greater than 50% respiratory variability, suggesting right atrial pressure of 3 mmHg. LEFT VENTRICLE PLAX 2D LV IVS:        0.90 cm  Diastology LVOT diam:     2.40 cm  LV e' medial:    3.92 cm/s LV SV:         85       LV E/e' medial:  31.4 LV SV Index:   49       LV e' lateral:   6.31 cm/s LVOT Area:     4.52 cm LV E/e' lateral: 19.5  LEFT ATRIUM             Index       RIGHT ATRIUM           Index LA diam:        4.00 cm 2.32 cm/m  RA Area:     13.30 cm LA Vol (A2C):   84.9 ml 49.15 ml/m RA Volume:   29.80 ml  17.25 ml/m LA Vol (A4C):   68.3 ml 39.54 ml/m LA Biplane Vol: 76.5 ml 44.29 ml/m  AORTIC VALVE AV Area (Vmax):    1.59 cm AV Area (Vmean):   1.52 cm AV Area (VTI):     1.63 cm AV Vmax:           245.50 cm/s AV Vmean:          165.000 cm/s AV VTI:            0.518 m AV Peak Grad:      24.1 mmHg AV Mean Grad:      13.0 mmHg LVOT Vmax:         86.40 cm/s LVOT Vmean:        55.400 cm/s LVOT VTI:          0.187 m LVOT/AV VTI ratio: 0.36 AI PHT:            516 msec  AORTA Ao Root diam: 4.10 cm MITRAL VALVE  TRICUSPID VALVE MV Area (PHT): 5.54 cm     TR Peak grad:   41.0 mmHg MV Area VTI:   2.28 cm     TR Vmax:        320.00 cm/s MV Peak grad:  5.2 mmHg MV Mean grad:  2.0 mmHg     SHUNTS MV Vmax:       1.14 m/s     Systemic VTI:  0.19 m MV Vmean:      68.5 cm/s    Systemic Diam: 2.40 cm MV Decel Time: 137 msec MR Peak grad: 78.1 mmHg MR Vmax:      442.00 cm/s MV E velocity: 123.00 cm/s MV A velocity: 64.50 cm/s MV E/A ratio:  1.91 Oswaldo Milian MD Electronically signed by Oswaldo Milian MD Signature Date/Time: 01/17/2021/5:17:49 PM    Final (Updated)      Assessment and Plan:    Non-STEMI with known CAD -High-sensitivity troponin 954>> 640>> 815>> 899.  His elevated troponin likely demand ischemia from respiratory distress and volume overload.  However, patient has newly depressed LV function with wall motion abnormality. Also EKG changes in leads V3-V5.  He does have history of CAD with residual disease which treated medically.  Last cardiac catheterization in 2019 after intermediate risk stress test as above. -Given hemoptysis agree with holding heparin -Continue home aspirin and Plavix -Continue Imdur and carvedilol (hx of prior bradycardia) -He is not on statin at home -We will review further planning with Dr. Radford Pax  2.  Acute combined CHF - Echocardiogram yesterday showed newly reduced LV function to 30 to 35% (EF was 60 to 65% on 11/11/20), regional wall motion abnormality, grade 2 diastolic dysfunction with elevated left atrial pressure.  RVSP 44 mmHg. -Diuresed -3.7 L however weight up 1 pound since admit. -Continue IV diuresis -Continue low-dose carvedilol (history of bradycardia) -Will review addition of ACE/ARB/Entresto.  Serum creatinine in 1.5 range.  It was normal in 2019.  3.  Presumed CKD stage III -Serum creatinine was normal few years ago.  Currently stable in 1.5 range -Follow closely with diuresis  4.  Acute hypoxic respiratory failure secondary to COVID-19 pneumonia -Per primary team  5. Aortic stenosis - Echo this admission showed mild to  moderate aortic stenosis. Aortic valve mean gradient measures 13.0 mmHg (was 20 in 10/2020) Aortic valve peak gradient   measures 24.1 mmHg (was 35.8 on 10/2020) Aortic valve area, by VTI measures 1.63 cm.  - follow with routine echo   Dr. Radford Pax to see later today.   Risk Assessment/Risk Scores:    TIMI Risk Score for Unstable Angina or Non-ST Elevation MI:   The patient's TIMI risk score is 6, which indicates a 41% risk of all cause mortality, new or recurrent myocardial infarction or need for  urgent revascularization in the next 14 days.{   New York Heart Association (NYHA) Functional Class NYHA Class III      For questions or updates, please contact [facility] Please consult [email or web address] for contact info under    Jarrett Soho, PA  01/18/2021 8:59 AM

## 2021-01-18 NOTE — Assessment & Plan Note (Signed)
--   Now asymptomatic, oxygen down to 3 L, no evidence of gross volume overload. -- New diagnosis, newly depressed EF secondary to NSTEMI -- Continue diuresis and management as per cardiology

## 2021-01-19 ENCOUNTER — Inpatient Hospital Stay (HOSPITAL_COMMUNITY): Payer: Medicare Other

## 2021-01-19 DIAGNOSIS — U071 COVID-19: Secondary | ICD-10-CM | POA: Diagnosis not present

## 2021-01-19 DIAGNOSIS — J1282 Pneumonia due to coronavirus disease 2019: Secondary | ICD-10-CM | POA: Diagnosis not present

## 2021-01-19 LAB — CBC WITH DIFFERENTIAL/PLATELET
Abs Immature Granulocytes: 0.08 10*3/uL — ABNORMAL HIGH (ref 0.00–0.07)
Basophils Absolute: 0 10*3/uL (ref 0.0–0.1)
Basophils Relative: 0 %
Eosinophils Absolute: 0 10*3/uL (ref 0.0–0.5)
Eosinophils Relative: 0 %
HCT: 37.1 % — ABNORMAL LOW (ref 39.0–52.0)
Hemoglobin: 11.9 g/dL — ABNORMAL LOW (ref 13.0–17.0)
Immature Granulocytes: 1 %
Lymphocytes Relative: 23 %
Lymphs Abs: 2.5 10*3/uL (ref 0.7–4.0)
MCH: 25.9 pg — ABNORMAL LOW (ref 26.0–34.0)
MCHC: 32.1 g/dL (ref 30.0–36.0)
MCV: 80.7 fL (ref 80.0–100.0)
Monocytes Absolute: 0.6 10*3/uL (ref 0.1–1.0)
Monocytes Relative: 5 %
Neutro Abs: 7.4 10*3/uL (ref 1.7–7.7)
Neutrophils Relative %: 71 %
Platelets: 368 10*3/uL (ref 150–400)
RBC: 4.6 MIL/uL (ref 4.22–5.81)
RDW: 15.6 % — ABNORMAL HIGH (ref 11.5–15.5)
WBC: 10.5 10*3/uL (ref 4.0–10.5)
nRBC: 0 % (ref 0.0–0.2)

## 2021-01-19 LAB — COMPREHENSIVE METABOLIC PANEL
ALT: 19 U/L (ref 0–44)
ALT: 20 U/L (ref 0–44)
AST: 19 U/L (ref 15–41)
AST: 22 U/L (ref 15–41)
Albumin: 2.8 g/dL — ABNORMAL LOW (ref 3.5–5.0)
Albumin: 2.8 g/dL — ABNORMAL LOW (ref 3.5–5.0)
Alkaline Phosphatase: 59 U/L (ref 38–126)
Alkaline Phosphatase: 60 U/L (ref 38–126)
Anion gap: 10 (ref 5–15)
Anion gap: 10 (ref 5–15)
BUN: 48 mg/dL — ABNORMAL HIGH (ref 8–23)
BUN: 49 mg/dL — ABNORMAL HIGH (ref 8–23)
CO2: 27 mmol/L (ref 22–32)
CO2: 27 mmol/L (ref 22–32)
Calcium: 9.1 mg/dL (ref 8.9–10.3)
Calcium: 9.1 mg/dL (ref 8.9–10.3)
Chloride: 102 mmol/L (ref 98–111)
Chloride: 102 mmol/L (ref 98–111)
Creatinine, Ser: 1.1 mg/dL (ref 0.61–1.24)
Creatinine, Ser: 1.18 mg/dL (ref 0.61–1.24)
GFR, Estimated: 58 mL/min — ABNORMAL LOW (ref >60)
GFR, Estimated: >60 mL/min (ref >60)
Glucose, Bld: 106 mg/dL — ABNORMAL HIGH (ref 70–99)
Glucose, Bld: 107 mg/dL — ABNORMAL HIGH (ref 70–99)
Potassium: 3.2 mmol/L — ABNORMAL LOW (ref 3.5–5.1)
Potassium: 3.5 mmol/L (ref 3.5–5.1)
Sodium: 139 mmol/L (ref 135–145)
Sodium: 139 mmol/L (ref 135–145)
Total Bilirubin: 0.7 mg/dL (ref 0.3–1.2)
Total Bilirubin: 0.7 mg/dL (ref 0.3–1.2)
Total Protein: 6.3 g/dL — ABNORMAL LOW (ref 6.5–8.1)
Total Protein: 6.3 g/dL — ABNORMAL LOW (ref 6.5–8.1)

## 2021-01-19 LAB — D-DIMER, QUANTITATIVE: D-Dimer, Quant: 1.31 ug/mL-FEU — ABNORMAL HIGH (ref 0.00–0.50)

## 2021-01-19 LAB — C-REACTIVE PROTEIN: CRP: 1.9 mg/dL — ABNORMAL HIGH (ref <1.0)

## 2021-01-19 LAB — PHOSPHORUS: Phosphorus: 3 mg/dL (ref 2.5–4.6)

## 2021-01-19 LAB — PROCALCITONIN: Procalcitonin: 8.1 ng/mL

## 2021-01-19 LAB — TSH: TSH: 4.636 u[IU]/mL — ABNORMAL HIGH (ref 0.350–4.500)

## 2021-01-19 MED ORDER — POTASSIUM CHLORIDE CRYS ER 20 MEQ PO TBCR
40.0000 meq | EXTENDED_RELEASE_TABLET | Freq: Once | ORAL | Status: AC
  Administered 2021-01-19: 40 meq via ORAL
  Filled 2021-01-19: qty 2

## 2021-01-19 MED ORDER — LEVOTHYROXINE SODIUM 50 MCG PO TABS
50.0000 ug | ORAL_TABLET | Freq: Every day | ORAL | Status: DC
  Administered 2021-01-20 – 2021-01-21 (×2): 50 ug via ORAL
  Filled 2021-01-19 (×2): qty 1

## 2021-01-19 NOTE — Progress Notes (Signed)
Progress Note    Anthony Petty  [id number] DOB: 06-Jul-1927  DOA: 01/16/2021 PCP: Javier Glazier, MD    Brief Narrative:     Medical records reviewed and are as summarized below:  Anthony Petty is an 87 y.o. male PMH colon cancer status post colostomy, metastatic prostate cancer, diagnosed with COVID August 16, presented to the emergency department in the respiratory extremis with oxygen requirement quickly escalating to 15 L on nonrebreather.  Admitted for acute hypoxic respiratory failure secondary COVID-19 pneumonia, suspected CHF.  Echocardiogram revealed significant systolic dysfunction which is new.  In retrospect, presentation felt to be predominantly from NSTEMI and acute systolic CHF complicated by mild COVID and possible pneumonia.  Seen by pulmonology and cardiology.  Clinical condition improving.  Likely home in 72 hours if continues to improve.  Assessment/Plan:   Principal Problem:   Pneumonia due to COVID-19 virus Active Problems:   CAD S/P percutaneous coronary angioplasty   NSTEMI (non-ST elevated myocardial infarction) ([place])   Acute hypoxemic respiratory failure due to COVID-19 ([place])   S/P colostomy (HCC)   Benign prostatic hyperplasia with urinary obstruction   DNR (do not resuscitate)/DNI(Do Not Intubate)   Acute systolic CHF (congestive heart failure) ([place])   CAP (community acquired pneumonia)   Pneumonia due to COVID-19 virus -- Given clinical improvement no indication for Actemra/baricitinib, but will continue continue remdesivir and steroid, vitamins.  Continue to wean oxygen as tolerated.   Acute systolic CHF (congestive heart failure) (HCC) -- Now asymptomatic, oxygen down to 3 L, no evidence of gross volume overload. -- New diagnosis, newly depressed EF secondary to NSTEMI -- Continue diuresis and management as per cardiology   NSTEMI (non-ST elevated myocardial infarction) ([place]) -- In retrospect, this is likely predominant reason for  his presentation -- Asymptomatic.  Given recurrent hemoptysis, heparin stopped.  Continue aspirin, carvedilol, hydralazine, Imdur -cards consult   CAP (community acquired pneumonia) --suspected based on procalcitonin, hypoxia --continue empiric abx   Acute hypoxemic respiratory failure due to COVID-19 [facility]) -- Multifactorial, COVID, possible bacterial pneumonia, acute systolic heart failure --Continue antibiotics, COVID treatment, treatment directed CHF -has been weaned to room air   Benign prostatic hyperplasia with urinary obstruction --has outpt foley   CAD S/P percutaneous coronary angioplasty --Asymptomatic.  Continue ASA, coreg and imdur.   Benign essential hypertension Stable.   S/P colostomy (HCC) Chronic.   Elevated troponin -- May be demand ischemia given severe respiratory distress on admission, however given rapid clinical improvement, concern for new cardiac dysfunction -- echo: Left ventricular ejection fraction, by estimation, is 30 to 35%. The  left ventricle has moderately decreased function. The left ventricle  demonstrates regional wall motion abnormalities (see scoring  diagram/findings for description). Left ventricular   diastolic parameters are consistent with Grade II diastolic dysfunction  (pseudonormalization).   Nutritional Assessment: Body mass index is 21.79 kg/m.Marland Kitchen Seen by dietician.  I agree with the assessment and plan as outlined below: Nutrition Status:     Skin Assessment: I have examined the patient's skin and I agree with the wound assessment as performed by the wound care RN as outlined below: Pressure Injury 01/16/21 Coccyx Medial Unstageable - Full thickness tissue loss in which the base of the injury is covered by slough (yellow, tan, gray, green or brown) and/or eschar (tan, brown or black) in the wound bed. (Active)  01/16/21 1914  Location: Coccyx  Location Orientation: Medial  Staging: Unstageable - Full thickness tissue loss  in which the base of  the injury is covered by slough (yellow, tan, gray, green or brown) and/or eschar (tan, brown or black) in the wound bed.  Wound Description (Comments):   Present on Admission: Yes     PT eval  Delirium precautions    Family Communication/Anticipated D/C date and plan/Code Status   DVT prophylaxis: Lovenox ordered. Code Status: Full Code.   Disposition Plan: Status is: Inpatient  Remains inpatient appropriate because:Inpatient level of care appropriate due to severity of illness  Dispo: The patient is from: Home              Anticipated d/c is to:  tbd              Patient currently is not medically stable to d/c.   Difficult to place patient No         Medical Consultants:   Pccm cards    Subjective:   Confused, asking for his wife  Objective:    Vitals:   01/19/21 1000 01/19/21 1100 01/19/21 1200 01/19/21 1300  BP: 111/63  107/73 (!) 110/53  Pulse: 70 (!) 50 69 69  Resp: (!) 23 16  (!) 22  Temp:      TempSrc:      SpO2: 95% 99% 96% 95%  Weight:      Height:        Intake/Output Summary (Last 24 hours) at 01/19/2021 1403 Last data filed at 01/19/2021 1301 Gross per 24 hour  Intake 565.88 ml  Output 2355 ml  Net -1789.12 ml   Filed Weights   01/16/21 1816 01/18/21 0450 01/19/21 0529  Weight: 62.6 kg 63.1 kg 59.7 kg    Exam:  General: Appearance:    Elderly male in no acute distress     Lungs:     respirations unlabored  Heart:    Normal heart rate.    MS:   All extremities are intact.    Neurologic:   Awake, alert, confused     Data Reviewed:   I have personally reviewed following labs and imaging studies:  Labs: Labs show the following:   Basic Metabolic Panel: Recent Labs  Lab 01/16/21 1339 01/16/21 1902 01/16/21 2141 01/18/21 0224 01/19/21 1054  NA 130*  --  130* 135 139  139  K 4.4  --  4.7 4.6 3.2*  3.5  CL 100  --  101 105 102  102  CO2 23  --  20* '22 27  27  '$ GLUCOSE 112*  --  144* 121*  106*  107*  BUN 41*  --  41* 47* 48*  49*  CREATININE 1.54* 1.55* 1.56* 1.59* 1.18  1.10  CALCIUM 8.8*  --  8.4* 8.6* 9.1  9.1  MG  --   --  2.4 2.2  --   PHOS  --   --   --  3.9 3.0   GFR Estimated Creatinine Clearance: 36.9 mL/min (by C-G formula based on SCr of 1.1 mg/dL). Liver Function Tests: Recent Labs  Lab 01/16/21 1339 01/18/21 0224 01/19/21 1054  AST '28 29 19  22  '$ ALT '21 14 20  19  '$ ALKPHOS 66 51 60  59  BILITOT 0.7 0.9 0.7  0.7  PROT 6.7 5.9* 6.3*  6.3*  ALBUMIN 2.8* 2.5* 2.8*  2.8*   No results for input(s): LIPASE, AMYLASE in the last 168 hours. No results for input(s): AMMONIA in the last 168 hours. Coagulation profile No results for input(s): INR, PROTIME in the last 168 hours.  CBC: Recent Labs  Lab 01/16/21 1902 01/17/21 0258 01/18/21 0224 01/18/21 0928 01/19/21 1054  WBC 12.9* 8.0 11.6* 13.5* 10.5  NEUTROABS  --  5.5 8.2*  --  7.4  HGB 10.2* 9.7* 9.9* 12.2* 11.9*  HCT 32.0* 31.0* 30.8* 37.2* 37.1*  MCV 80.4 81.6 79.6* 79.1* 80.7  PLT 280 279 369 339 368   Cardiac Enzymes: No results for input(s): CKTOTAL, CKMB, CKMBINDEX, TROPONINI in the last 168 hours. BNP (last 3 results) No results for input(s): PROBNP in the last 8760 hours. CBG: No results for input(s): GLUCAP in the last 168 hours. D-Dimer: Recent Labs    01/18/21 0224 01/19/21 1054  DDIMER 0.94* 1.31*   Hgb A1c: No results for input(s): HGBA1C in the last 72 hours. Lipid Profile: Recent Labs    01/18/21 0224  CHOL 124  HDL 32*  LDLCALC 77  TRIG 75  CHOLHDL 3.9   Thyroid function studies: Recent Labs    01/19/21 1054  TSH 4.636*   Anemia work up: Recent Labs    01/16/21 1902  FERRITIN 102   Sepsis Labs: Recent Labs  Lab 01/16/21 1339 01/16/21 1902 01/17/21 0258 01/18/21 0224 01/18/21 0928 01/19/21 1054  PROCALCITON 10.40  --   --  8.25  --  8.10  WBC 12.0*   < > 8.0 11.6* 13.5* 10.5  LATICACIDVEN  --   --   --  1.2  --   --    < > = values in  this interval not displayed.    Microbiology Recent Results (from the past 240 hour(s))  MRSA Next Gen by PCR, Nasal     Status: None   Collection Time: 01/16/21  6:08 PM   Specimen: Nasal Mucosa; Nasal Swab  Result Value Ref Range Status   MRSA by PCR Next Gen NOT DETECTED NOT DETECTED Final    Comment: (NOTE) The GeneXpert MRSA Assay (FDA approved for NASAL specimens only), is one component of a comprehensive MRSA colonization surveillance program. It is not intended to diagnose MRSA infection nor to guide or monitor treatment for MRSA infections. Test performance is not FDA approved in patients less than 8 years old. Performed at [facility], [place] [address], [place], [place] [address]     Procedures and diagnostic studies:  DG Chest Port 1 View  Result Date: 01/19/2021 CLINICAL DATA:  Respiratory failure, history of coronary artery disease post MI, CHF, colon cancer EXAM: PORTABLE CHEST 1 VIEW COMPARISON:  Portable exam 0447 hours compared to 01/16/2021 FINDINGS: Normal heart size and mediastinal contours. Diffuse BILATERAL pulmonary infiltrates again seen. Question small layering pleural effusions bilaterally. No pneumothorax. Numerous osseous sclerotic foci consistent with osseous metastatic disease. IMPRESSION: Persistent BILATERAL pulmonary infiltrates consistent with history of COVID-19 pneumonia. Question small layering pleural effusions bilaterally. Diffuse sclerotic osseous metastatic disease. Electronically Signed   By: Lavonia Dana M.D.   On: 01/19/2021 09:48   VAS [name] LOWER EXTREMITY VENOUS (DVT)  Result Date: 01/17/2021  Lower Venous DVT Study Patient Name:  Anthony Petty  Date of Exam:   01/17/2021 Medical Rec #: [id number]          Accession #:    [id number] Date of Birth: 1928/04/10          Patient Gender: M Patient Age:   15 years Exam Location:  [facility] Procedure:      VAS [name] LOWER EXTREMITY VENOUS (DVT) Referring Phys: MURALI  RAMASWAMY --------------------------------------------------------------------------------  Indications: Edema, and COVID.  Comparison Study:  No prior study Performing Technologist: Maudry Mayhew MHA, RDMS, RVT, RDCS  Examination Guidelines: A complete evaluation includes B-mode imaging, spectral Doppler, color Doppler, and power Doppler as needed of all accessible portions of each vessel. Bilateral testing is considered an integral part of a complete examination. Limited examinations for reoccurring indications may be performed as noted. The reflux portion of the exam is performed with the patient in reverse Trendelenburg.  +---------+---------------+---------+-----------+----------+--------------+ RIGHT    CompressibilityPhasicitySpontaneityPropertiesThrombus Aging +---------+---------------+---------+-----------+----------+--------------+ CFV      Full           Yes      Yes                                 +---------+---------------+---------+-----------+----------+--------------+ SFJ      Full                                                        +---------+---------------+---------+-----------+----------+--------------+ FV Prox  Full                                                        +---------+---------------+---------+-----------+----------+--------------+ FV Mid   Full                                                        +---------+---------------+---------+-----------+----------+--------------+ FV DistalFull                                                        +---------+---------------+---------+-----------+----------+--------------+ PFV      Full                                                        +---------+---------------+---------+-----------+----------+--------------+ POP      Full           Yes      Yes                                 +---------+---------------+---------+-----------+----------+--------------+ PTV      Full                                                         +---------+---------------+---------+-----------+----------+--------------+ PERO     Full                                                        +---------+---------------+---------+-----------+----------+--------------+   +---------+---------------+---------+-----------+----------+--------------+  LEFT     CompressibilityPhasicitySpontaneityPropertiesThrombus Aging +---------+---------------+---------+-----------+----------+--------------+ CFV      Full           Yes      Yes                                 +---------+---------------+---------+-----------+----------+--------------+ SFJ      Full                                                        +---------+---------------+---------+-----------+----------+--------------+ FV Prox  Full                                                        +---------+---------------+---------+-----------+----------+--------------+ FV Mid   Full                                                        +---------+---------------+---------+-----------+----------+--------------+ FV DistalFull                                                        +---------+---------------+---------+-----------+----------+--------------+ PFV      Full                                                        +---------+---------------+---------+-----------+----------+--------------+ POP      Full           Yes      Yes                                 +---------+---------------+---------+-----------+----------+--------------+ PTV      Full                                                        +---------+---------------+---------+-----------+----------+--------------+ PERO     Full                                                        +---------+---------------+---------+-----------+----------+--------------+     Summary: RIGHT: - There is no evidence of deep vein thrombosis  in the lower extremity.  - No cystic structure found in the popliteal fossa.  LEFT: - There is no evidence of deep vein thrombosis in the lower extremity.  - No  cystic structure found in the popliteal fossa.  *See table(s) above for measurements and observations.    Preliminary     Medications:    vitamin C  500 mg Oral Daily   aspirin EC  81 mg Oral Daily   carvedilol  3.125 mg Oral BID WC   Chlorhexidine Gluconate Cloth  6 each Topical Daily   clopidogrel  75 mg Oral Daily   hydrALAZINE  10 mg Oral Q8H   isosorbide mononitrate  30 mg Oral Daily   [START ON 01/20/2021] levothyroxine  50 mcg Oral Q0600   [START ON 01/20/2021] predniSONE  50 mg Oral Daily   zinc sulfate  220 mg Oral Daily   Continuous Infusions:  azithromycin Stopped (01/18/21 1852)   cefTRIAXone (ROCEPHIN)  IV Stopped (01/18/21 1700)   remdesivir 100 mg in NS 100 mL Stopped (01/19/21 1354)     LOS: 3 days   Geradine Girt  [facility]   How to contact the [facility] Attending or Consulting provider [place] or covering provider during after hours [place], for this patient?  Check the care team in [facility] and look for a) attending/consulting TR[facility] provider listed and b) the [facility] team listed Log into [email or web address] and use [place]'s universal password to access. If you do not have the password, please contact the hospital operator. Locate the [facility] provider you are looking for under [facility] and page to a number that you can be directly reached. If you still have difficulty reaching the provider, please page the [facility] (Director on Call) for the Hospitalists listed on amion for assistance.  01/19/2021, 2:03 PM

## 2021-01-19 NOTE — Progress Notes (Signed)
Attempted to call report to nurse on 4W. No answer this time, will call back.

## 2021-01-19 NOTE — Progress Notes (Signed)
Report given to [facility] on 4W.

## 2021-01-20 DIAGNOSIS — J1282 Pneumonia due to coronavirus disease 2019: Secondary | ICD-10-CM | POA: Diagnosis not present

## 2021-01-20 DIAGNOSIS — U071 COVID-19: Secondary | ICD-10-CM | POA: Diagnosis not present

## 2021-01-20 LAB — COMPREHENSIVE METABOLIC PANEL
ALT: 22 U/L (ref 0–44)
AST: 21 U/L (ref 15–41)
Albumin: 2.6 g/dL — ABNORMAL LOW (ref 3.5–5.0)
Alkaline Phosphatase: 61 U/L (ref 38–126)
Anion gap: 9 (ref 5–15)
BUN: 50 mg/dL — ABNORMAL HIGH (ref 8–23)
CO2: 26 mmol/L (ref 22–32)
Calcium: 8.9 mg/dL (ref 8.9–10.3)
Chloride: 104 mmol/L (ref 98–111)
Creatinine, Ser: 1.3 mg/dL — ABNORMAL HIGH (ref 0.61–1.24)
GFR, Estimated: 52 mL/min — ABNORMAL LOW (ref >60)
Glucose, Bld: 128 mg/dL — ABNORMAL HIGH (ref 70–99)
Potassium: 4 mmol/L (ref 3.5–5.1)
Sodium: 139 mmol/L (ref 135–145)
Total Bilirubin: 0.7 mg/dL (ref 0.3–1.2)
Total Protein: 5.9 g/dL — ABNORMAL LOW (ref 6.5–8.1)

## 2021-01-20 LAB — CBC WITH DIFFERENTIAL/PLATELET
Abs Immature Granulocytes: 0.11 10*3/uL — ABNORMAL HIGH (ref 0.00–0.07)
Basophils Absolute: 0 10*3/uL (ref 0.0–0.1)
Basophils Relative: 0 %
Eosinophils Absolute: 0 10*3/uL (ref 0.0–0.5)
Eosinophils Relative: 0 %
HCT: 34.5 % — ABNORMAL LOW (ref 39.0–52.0)
Hemoglobin: 11.1 g/dL — ABNORMAL LOW (ref 13.0–17.0)
Immature Granulocytes: 1 %
Lymphocytes Relative: 24 %
Lymphs Abs: 2.8 10*3/uL (ref 0.7–4.0)
MCH: 25.5 pg — ABNORMAL LOW (ref 26.0–34.0)
MCHC: 32.2 g/dL (ref 30.0–36.0)
MCV: 79.1 fL — ABNORMAL LOW (ref 80.0–100.0)
Monocytes Absolute: 0.7 10*3/uL (ref 0.1–1.0)
Monocytes Relative: 6 %
Neutro Abs: 8 10*3/uL — ABNORMAL HIGH (ref 1.7–7.7)
Neutrophils Relative %: 69 %
Platelets: 376 10*3/uL (ref 150–400)
RBC: 4.36 MIL/uL (ref 4.22–5.81)
RDW: 15.9 % — ABNORMAL HIGH (ref 11.5–15.5)
WBC: 11.5 10*3/uL — ABNORMAL HIGH (ref 4.0–10.5)
nRBC: 0 % (ref 0.0–0.2)

## 2021-01-20 LAB — PHOSPHORUS: Phosphorus: 3.4 mg/dL (ref 2.5–4.6)

## 2021-01-20 LAB — D-DIMER, QUANTITATIVE: D-Dimer, Quant: 0.88 ug/mL-FEU — ABNORMAL HIGH (ref 0.00–0.50)

## 2021-01-20 LAB — C-REACTIVE PROTEIN: CRP: 1.3 mg/dL — ABNORMAL HIGH (ref <1.0)

## 2021-01-20 MED ORDER — CEFDINIR 300 MG PO CAPS
300.0000 mg | ORAL_CAPSULE | Freq: Two times a day (BID) | ORAL | Status: DC
  Administered 2021-01-20 – 2021-01-21 (×3): 300 mg via ORAL
  Filled 2021-01-20 (×4): qty 1

## 2021-01-20 NOTE — NC FL2 (Signed)
[place] LEVEL OF CARE SCREENING TOOL     IDENTIFICATION  Patient Name: Anthony Petty Birthdate: 19-Apr-1928 Sex: male Admission Date (Current Location): 01/16/2021  [facility] and [place] Number:  Herbalist and Address:  [facility],  [place], [place]      Provider Number: [id number]  Attending Physician Name and Address:  Geradine Girt, DO  Relative Name and Phone Number:  Cassandria Anger   [phone]  Hoxworth,Carole C Spouse [phone]    Current Level of Care: Hospital Recommended Level of Care: [place] Prior Approval Number:    Date Approved/Denied:   PASRR Number: [id number]  Discharge Plan: SNF    Current Diagnoses: Patient Active Problem List   Diagnosis Date Noted   Acute systolic CHF (congestive heart failure) ([place]) 01/18/2021   CAP (community acquired pneumonia) 01/18/2021   Pneumonia due to COVID-19 virus 01/16/2021   Acute hypoxemic respiratory failure due to COVID-19 ([place]) 01/16/2021   S/P colostomy ([place]) 01/16/2021   DNR (do not resuscitate)/DNI(Do Not Intubate) 01/16/2021   Bladder cancer metastasized to pelvic region ([place]) 09/12/2018   Abnormal nuclear stress test --intermediate risk with inferior ischemia 06/12/2017   Elevated troponin 06/12/2017   NSTEMI (non-ST elevated myocardial infarction) ([place]) 06/11/2017   Left carotid bruit 07/19/2016   Colostomy present ([place]) 12/29/2015   Benign prostatic hyperplasia with urinary obstruction 12/21/2015   Benign essential hypertension 12/21/2015   OSA on CPAP 09/15/2015   Raynaud's disease 06/10/2013   Atypical angina ([place]) 06/02/2013   Bradycardia 05/16/2013   Heart palpitations 05/16/2013   Hyperlipidemia with target LDL less than 70 04/27/2013   History of colon cancer 06/27/2007   CAD S/P percutaneous coronary angioplasty 06/27/2007    Orientation RESPIRATION BLADDER Height & Weight     Self, Place     Incontinent Weight: 140 lb 3.4 oz (63.6 kg) Height:  '5\' 7"'$  (170.2 cm)  BEHAVIORAL SYMPTOMS/MOOD NEUROLOGICAL BOWEL NUTRITION STATUS      Colostomy Diet (Regular)  AMBULATORY STATUS COMMUNICATION OF NEEDS Skin   Limited Assist Verbally Other (Comment), PU Stage and Appropriate Care (Unstageable PRN Dressing Change)                       Personal Care Assistance Level of Assistance  Bathing, Feeding, Dressing Bathing Assistance: Limited assistance Feeding assistance: Independent Dressing Assistance: Limited assistance     Functional Limitations Info  Sight, Speech, Hearing Sight Info: Adequate Hearing Info: Adequate Speech Info: Adequate    SPECIAL CARE FACTORS FREQUENCY  PT (By licensed PT), OT (By licensed OT)     PT Frequency: Minimum 5x a week OT Frequency: Minimum 5x a week            Contractures Contractures Info: Not present    Additional Factors Info  Code Status, Allergies, Isolation Precautions Code Status Info: DNR Allergies Info: NKA     Isolation Precautions Info: COVID + Air Con precautions     Current Medications (01/20/2021):  This is the current hospital active medication list Current Facility-Administered Medications  Medication Dose Route Frequency Provider Last Rate Last Admin   acetaminophen (TYLENOL) tablet 650 mg  650 mg Oral Q6H PRN Samuella Cota, MD       Or   acetaminophen (TYLENOL) suppository 650 mg  650 mg Rectal Q6H PRN Samuella Cota, MD       albuterol (PROVENTIL) (2.5 MG/3ML) 0.083% nebulizer solution 2.5 mg  2.5 mg Nebulization  Q2H PRN Samuella Cota, MD       ascorbic acid (VITAMIN C) tablet 500 mg  500 mg Oral Daily Samuella Cota, MD   500 mg at 01/20/21 [id number]   aspirin EC tablet 81 mg  81 mg Oral Daily Samuella Cota, MD   81 mg at 01/20/21 0815   carvedilol (COREG) tablet 3.125 mg  3.125 mg Oral BID WC Samuella Cota, MD   3.125 mg at 01/20/21 1601   cefdinir (OMNICEF) capsule 300 mg  300 mg Oral  Q12H Eulogio Bear U, DO   300 mg at 01/20/21 [id number]   Chlorhexidine Gluconate Cloth 2 % PADS 6 each  6 each Topical Daily Samuella Cota, MD   6 each at 01/20/21 1000   chlorpheniramine-HYDROcodone (TUSSIONEX) 10-8 MG/5ML suspension 5 mL  5 mL Oral Q12H PRN Samuella Cota, MD       clopidogrel (PLAVIX) tablet 75 mg  75 mg Oral Daily Samuella Cota, MD   75 mg at 01/20/21 0815   guaiFENesin-dextromethorphan (ROBITUSSIN DM) 100-10 MG/5ML syrup 10 mL  10 mL Oral Q4H PRN Samuella Cota, MD       hydrALAZINE (APRESOLINE) tablet 10 mg  10 mg Oral Q8H Samuella Cota, MD   10 mg at 01/20/21 1601   isosorbide mononitrate (IMDUR) 24 hr tablet 30 mg  30 mg Oral Daily Samuella Cota, MD   30 mg at 01/20/21 0815   levothyroxine (SYNTHROID) tablet 50 mcg  50 mcg Oral Q0600 Geradine Girt, DO   50 mcg at 01/20/21 0646   ondansetron (ZOFRAN) tablet 4 mg  4 mg Oral Q6H PRN Samuella Cota, MD       Or   ondansetron [facility]) injection 4 mg  4 mg Intravenous Q6H PRN Samuella Cota, MD       predniSONE (DELTASONE) tablet 50 mg  50 mg Oral Daily Eulogio Bear U, DO   50 mg at 01/20/21 [id number]   zinc sulfate capsule 220 mg  220 mg Oral Daily Samuella Cota, MD   220 mg at 01/20/21 [id number]     Discharge Medications: Please see discharge summary for a list of discharge medications.  Relevant Imaging Results:  Relevant Lab Results:   Additional Information SSN [id number]  Ross Ludwig, LCSW

## 2021-01-20 NOTE — Progress Notes (Signed)
Progress Note    Anthony Petty  [id number] DOB: Jan 31, 1928  DOA: 01/16/2021 PCP: Javier Glazier, MD    Brief Narrative:     Medical records reviewed and are as summarized below:  Anthony Petty is an 87 y.o. male PMH colon cancer status post colostomy, metastatic prostate cancer, diagnosed with COVID August 16, presented to the emergency department in the respiratory extremis with oxygen requirement quickly escalating to 15 L on nonrebreather.  Admitted for acute hypoxic respiratory failure secondary COVID-19 pneumonia, suspected CHF.  Echocardiogram revealed significant systolic dysfunction which is new.  In retrospect, presentation felt to be predominantly from NSTEMI and acute systolic CHF complicated by mild COVID and possible pneumonia.  Seen by pulmonology and cardiology.  Clinical condition improving.    Assessment/Plan:   Principal Problem:   Pneumonia due to COVID-19 virus Active Problems:   CAD S/P percutaneous coronary angioplasty   NSTEMI (non-ST elevated myocardial infarction) ([place])   Acute hypoxemic respiratory failure due to COVID-19 ([place])   S/P colostomy (HCC)   Benign prostatic hyperplasia with urinary obstruction   DNR (do not resuscitate)/DNI(Do Not Intubate)   Acute systolic CHF (congestive heart failure) ([place])   CAP (community acquired pneumonia)   Pneumonia due to COVID-19 virus -- much improved -finish course of steroids   Acute systolic CHF (congestive heart failure) (HCC) -- New diagnosis, newly depressed EF secondary to NSTEMI -- management as per cardiology   NSTEMI (non-ST elevated myocardial infarction) ([place]) -- In retrospect, this is likely predominant reason for his presentation -- Asymptomatic.  Given recurrent hemoptysis, heparin stopped.  Continue aspirin, carvedilol, hydralazine, Imdur -cards consult appreciated    CAP (community acquired pneumonia) --suspected based on procalcitonin, hypoxia --continue empiric abx-  change to PO   Acute hypoxemic respiratory failure due to COVID-19 [facility]) -- Multifactorial, COVID, possible bacterial pneumonia, acute systolic heart failure --Continue antibiotics, COVID treatment, treatment directed CHF -has been weaned to room air   Benign prostatic hyperplasia with urinary obstruction --has outpt foley   CAD S/P percutaneous coronary angioplasty --Asymptomatic.  Continue ASA, coreg and imdur.   Benign essential hypertension Stable.   S/P colostomy (HCC) Chronic.   Elevated troponin -- May be demand ischemia given severe respiratory distress on admission, however given rapid clinical improvement, concern for new cardiac dysfunction -- echo: Left ventricular ejection fraction, by estimation, is 30 to 35%. The  left ventricle has moderately decreased function. The left ventricle  demonstrates regional wall motion abnormalities (see scoring  diagram/findings for description). Left ventricular   diastolic parameters are consistent with Grade II diastolic dysfunction  (pseudonormalization).   Nutritional Assessment: Body mass index is 21.79 kg/m.Marland Kitchen Seen by dietician.  I agree with the assessment and plan as outlined below: Nutrition Status:     Skin Assessment: I have examined the patient's skin and I agree with the wound assessment as performed by the wound care RN as outlined below: Pressure Injury 01/16/21 Coccyx Medial Unstageable - Full thickness tissue loss in which the base of the injury is covered by slough (yellow, tan, gray, green or brown) and/or eschar (tan, brown or black) in the wound bed. (Active)  01/16/21 1914  Location: Coccyx  Location Orientation: Medial  Staging: Unstageable - Full thickness tissue loss in which the base of the injury is covered by slough (yellow, tan, gray, green or brown) and/or eschar (tan, brown or black) in the wound bed.  Wound Description (Comments):   Present on Admission: Yes     PT  eval  Delirium  precautions    Family Communication/Anticipated D/C date and plan/Code Status   DVT prophylaxis: Lovenox ordered. Code Status: Full Code.  Called nephew, had to leave message Disposition Plan: Status is: Inpatient  Remains inpatient appropriate because:Inpatient level of care appropriate due to severity of illness  Dispo: The patient is from: [place]              Anticipated d/c is to:  tbd              Patient currently is not medically stable to d/c.-- await cardiology final recommendations    Difficult to place patient No         Medical Consultants:   Pccm cards    Subjective:   Much improved this AM-- worked with PT  Objective:    Vitals:   01/19/21 2200 01/20/21 0500 01/20/21 0549 01/20/21 0815  BP: 112/64  122/71 121/79  Pulse: (!) 58  69 73  Resp: 19  12   Temp: 97.8 F (36.6 C)  98 F (36.7 C)   TempSrc: Oral  Oral   SpO2: 95%  95%   Weight:  63.6 kg    Height:        Intake/Output Summary (Last 24 hours) at 01/20/2021 1414 Last data filed at 01/20/2021 [id number] Gross per 24 hour  Intake 340 ml  Output 850 ml  Net -510 ml   Filed Weights   01/18/21 0450 01/19/21 0529 01/20/21 0500  Weight: 63.1 kg 59.7 kg 63.6 kg    Exam:  General: Appearance:    Well developed, well nourished male in no acute distress     Lungs:     respirations unlabored  Heart:    Normal heart rate.    MS:   All extremities are intact.    Neurologic:   Awake, alert, oriented x 3- mildly hard of hearing       Data Reviewed:   I have personally reviewed following labs and imaging studies:  Labs: Labs show the following:   Basic Metabolic Panel: Recent Labs  Lab 01/16/21 1339 01/16/21 1902 01/16/21 2141 01/18/21 0224 01/19/21 1054 01/20/21 0600  NA 130*  --  130* 135 139  139 139  K 4.4  --  4.7 4.6 3.2*  3.5 4.0  CL 100  --  101 105 102  102 104  CO2 23  --  20* '22 27  27 26  '$ GLUCOSE 112*  --  144* 121* 106*  107* 128*  BUN 41*  --   41* 47* 48*  49* 50*  CREATININE 1.54* 1.55* 1.56* 1.59* 1.18  1.10 1.30*  CALCIUM 8.8*  --  8.4* 8.6* 9.1  9.1 8.9  MG  --   --  2.4 2.2  --   --   PHOS  --   --   --  3.9 3.0 3.4   GFR Estimated Creatinine Clearance: 33.3 mL/min (A) (by C-G formula based on SCr of 1.3 mg/dL (H)). Liver Function Tests: Recent Labs  Lab 01/16/21 1339 01/18/21 0224 01/19/21 1054 01/20/21 0600  AST '28 29 19  22 21  '$ ALT '21 14 20  19 22  '$ ALKPHOS 66 51 60  59 61  BILITOT 0.7 0.9 0.7  0.7 0.7  PROT 6.7 5.9* 6.3*  6.3* 5.9*  ALBUMIN 2.8* 2.5* 2.8*  2.8* 2.6*   No results for input(s): LIPASE, AMYLASE in the last 168 hours. No results for input(s): AMMONIA in the  last 168 hours. Coagulation profile No results for input(s): INR, PROTIME in the last 168 hours.  CBC: Recent Labs  Lab 01/17/21 0258 01/18/21 0224 01/18/21 0928 01/19/21 1054 01/20/21 0600  WBC 8.0 11.6* 13.5* 10.5 11.5*  NEUTROABS 5.5 8.2*  --  7.4 8.0*  HGB 9.7* 9.9* 12.2* 11.9* 11.1*  HCT 31.0* 30.8* 37.2* 37.1* 34.5*  MCV 81.6 79.6* 79.1* 80.7 79.1*  PLT 279 369 339 368 376   Cardiac Enzymes: No results for input(s): CKTOTAL, CKMB, CKMBINDEX, TROPONINI in the last 168 hours. BNP (last 3 results) No results for input(s): PROBNP in the last 8760 hours. CBG: No results for input(s): GLUCAP in the last 168 hours. D-Dimer: Recent Labs    01/19/21 1054 01/20/21 0600  DDIMER 1.31* 0.88*   Hgb A1c: No results for input(s): HGBA1C in the last 72 hours. Lipid Profile: Recent Labs    01/18/21 0224  CHOL 124  HDL 32*  LDLCALC 77  TRIG 75  CHOLHDL 3.9   Thyroid function studies: Recent Labs    01/19/21 1054  TSH 4.636*   Anemia work up: No results for input(s): VITAMINB12, FOLATE, FERRITIN, TIBC, IRON, RETICCTPCT in the last 72 hours.  Sepsis Labs: Recent Labs  Lab 01/16/21 1339 01/16/21 1902 01/18/21 0224 01/18/21 0928 01/19/21 1054 01/20/21 0600  PROCALCITON 10.40  --  8.25  --  8.10  --   WBC  12.0*   < > 11.6* 13.5* 10.5 11.5*  LATICACIDVEN  --   --  1.2  --   --   --    < > = values in this interval not displayed.    Microbiology Recent Results (from the past 240 hour(s))  MRSA Next Gen by PCR, Nasal     Status: None   Collection Time: 01/16/21  6:08 PM   Specimen: Nasal Mucosa; Nasal Swab  Result Value Ref Range Status   MRSA by PCR Next Gen NOT DETECTED NOT DETECTED Final    Comment: (NOTE) The GeneXpert MRSA Assay (FDA approved for NASAL specimens only), is one component of a comprehensive MRSA colonization surveillance program. It is not intended to diagnose MRSA infection nor to guide or monitor treatment for MRSA infections. Test performance is not FDA approved in patients less than 88 years old. Performed at [facility], [place] [address] [place], [place] [address]     Procedures and diagnostic studies:  DG Chest Port 1 View  Result Date: 01/19/2021 CLINICAL DATA:  Respiratory failure, history of coronary artery disease post MI, CHF, colon cancer EXAM: PORTABLE CHEST 1 VIEW COMPARISON:  Portable exam 0447 hours compared to 01/16/2021 FINDINGS: Normal heart size and mediastinal contours. Diffuse BILATERAL pulmonary infiltrates again seen. Question small layering pleural effusions bilaterally. No pneumothorax. Numerous osseous sclerotic foci consistent with osseous metastatic disease. IMPRESSION: Persistent BILATERAL pulmonary infiltrates consistent with history of COVID-19 pneumonia. Question small layering pleural effusions bilaterally. Diffuse sclerotic osseous metastatic disease. Electronically Signed   By: Lavonia Dana M.D.   On: 01/19/2021 09:48    Medications:    vitamin C  500 mg Oral Daily   aspirin EC  81 mg Oral Daily   carvedilol  3.125 mg Oral BID WC   cefdinir  300 mg Oral Q12H   Chlorhexidine Gluconate Cloth  6 each Topical Daily   clopidogrel  75 mg Oral Daily   hydrALAZINE  10 mg Oral Q8H   isosorbide mononitrate  30 mg Oral  Daily   levothyroxine  50 mcg Oral  [id number]   predniSONE  50 mg Oral Daily   zinc sulfate  220 mg Oral Daily   Continuous Infusions:     LOS: 4 days   Geradine Girt  [facility]   How to contact the [facility] Attending or Consulting provider [place] or covering provider during after hours [place], for this patient?  Check the care team in [facility] and look for a) attending/consulting TR[facility] provider listed and b) the [facility] team listed Log into [email or web address] and use [place] universal password to access. If you do not have the password, please contact the hospital operator. Locate the [facility] provider you are looking for under [facility] and page to a number that you can be directly reached. If you still have difficulty reaching the provider, please page the [facility] (Director on Call) for the Hospitalists listed on amion for assistance.  01/20/2021, 2:14 PM

## 2021-01-20 NOTE — Evaluation (Signed)
Physical Therapy Evaluation Patient Details Name: Anthony Petty MRN: [id number] DOB: May 22, 1928 Today's Date: 01/20/2021   History of Present Illness  Anthony Petty is an 87 y.o. male PMH colon cancer status post colostomy, metastatic prostate cancer, Raynuad's, bacterial endocarditis  diagnosed with COVID August 16, To ED 01/16/21 in  acute hypoxic respirtory failure, requiring  nonrebreather, presentation felt to be predominantly from NSTEMI and acute systolic CHF complicated by mild COVID and possible pneumonia  Clinical Impression  The patient is very pleasant No family available to obtain PLOFand MS, patient oriented to hospital, not reason for being in hospital , unable to state where he lives.. Patient indicates that he ambulates independently and resides with wife. Chart indicates  he resides in ALF.  Patient on RA, 95%. Patient ambulated  with min assistance x 10' using Rw. Pt admitted with above diagnosis.  Pt currently with functional limitations due to the deficits listed below (see PT Problem List). Pt will benefit from skilled PT to increase their independence and safety with mobility to allow discharge to the venue listed below.   Attempted to call Nephew( primary-no answer).     Follow Up Recommendations Home health PT;Supervision/Assistance - 24 hourvs SNf if needs higher level if he does reside in ALF.    Equipment Recommendations  None recommended by PT    Recommendations for Other Services       Precautions / Restrictions Precautions Precautions: Fall Precaution Comments: colostomy      Mobility  Bed Mobility Overal bed mobility: Needs Assistance Bed Mobility: Supine to Sit;Sit to Supine     Supine to sit: Min assist;HOB elevated Sit to supine: Min guard;HOB elevated   General bed mobility comments: gentle HHA to sit upright    Transfers Overall transfer level: Needs assistance Equipment used: Rolling walker (2 wheeled) Transfers: Sit to/from  Stand Sit to Stand: Min assist         General transfer comment: from bed, steady assistnace  Ambulation/Gait Ambulation/Gait assistance: Min Web designer (Feet): 10 Feet Assistive device: Rolling walker (2 wheeled) Gait Pattern/deviations: Step-to pattern;Step-through pattern Gait velocity: decr   General Gait Details: steady assistnace for balance  Stairs            Wheelchair Mobility    Modified Rankin (Stroke Patients Only)       Balance Overall balance assessment: Needs assistance Sitting-balance support: Bilateral upper extremity supported;Feet supported Sitting balance-Leahy Scale: Fair     Standing balance support: During functional activity;Bilateral upper extremity supported Standing balance-Leahy Scale: Poor Standing balance comment: tends to lean backwards                             Pertinent Vitals/Pain Pain Assessment: No/denies pain    Home Living Family/patient expects to be discharged to:: Unsure                 Additional Comments: patient unable to state, ? ALF.    Prior Function Level of Independence: Independent         Comments: per patient, does not use AD, lives with wife.     Hand Dominance   Dominant Hand: Right    Extremity/Trunk Assessment   Upper Extremity Assessment Upper Extremity Assessment: Overall WFL for tasks assessed    Lower Extremity Assessment Lower Extremity Assessment: Generalized weakness    Cervical / Trunk Assessment Cervical / Trunk Assessment: Kyphotic  Communication   Communication: HOH  Cognition Arousal/Alertness: Awake/alert  Behavior During Therapy: WFL for tasks assessed/performed Overall Cognitive Status: No family/caregiver present to determine baseline cognitive functioning Area of Impairment: Orientation;Following commands                 Orientation Level: Place;Situation     Following Commands: Follows one step commands consistently;Follows  multi-step commands inconsistently       General Comments: some difficulty with steps for IS      General Comments      Exercises Other Exercises Other Exercises: IS instructions x 10   Assessment/Plan    PT Assessment Patient needs continued PT services  PT Problem List Decreased strength;Decreased mobility;Decreased safety awareness;Decreased activity tolerance;Decreased balance;Decreased knowledge of use of DME;Decreased skin integrity;Impaired sensation       PT Treatment Interventions DME instruction;Therapeutic activities;Cognitive remediation;Gait training;Therapeutic exercise;Patient/family education;Functional mobility training    PT Goals (Current goals can be found in the Care Plan section)  Acute Rehab PT Goals Patient Stated Goal: agreed to OOB PT Goal Formulation: Patient unable to participate in goal setting Time For Goal Achievement: 02/03/21 Potential to Achieve Goals: Good    Frequency Min 3X/week   Barriers to discharge        Co-evaluation               AM-PAC PT "6 Clicks" Mobility  Outcome Measure Help needed turning from your back to your side while in a flat bed without using bedrails?: A Little Help needed moving from lying on your back to sitting on the side of a flat bed without using bedrails?: A Little Help needed moving to and from a bed to a chair (including a wheelchair)?: A Little Help needed standing up from a chair using your arms (e.g., wheelchair or bedside chair)?: A Little Help needed to walk in hospital room?: A Little Help needed climbing 3-5 steps with a railing? : A Lot 6 Click Score: 17    End of Session Equipment Utilized During Treatment: Gait belt Activity Tolerance: Patient tolerated treatment well Patient left: in bed;with bed alarm set Nurse Communication: Mobility status PT Visit Diagnosis: Unsteadiness on feet (R26.81);Other symptoms and signs involving the nervous system (R29.898)    Time: 0820-0850 PT  Time Calculation (min) (ACUTE ONLY): 30 min   Charges:   PT Evaluation $PT Eval Low Complexity: 1 Low PT Treatments $Gait Training: 8-22 mins        Tresa Endo PT Acute Rehabilitation Services Pager [phone] Office [phone]   Claretha Cooper 01/20/2021, 9:07 AM

## 2021-01-20 NOTE — TOC Progression Note (Addendum)
Transition of Care [facility]) - Progression Note    Patient Details  Name: Anthony Petty MRN: [id number] Date of Birth: Feb 25, 1928  Transition of Care [facility] CM/SW Contact  Ross Ludwig, [place] Phone Number: 01/20/2021, 2:37 PM  Clinical Narrative:    Patient is from [facility], plan to go to SNF side once medically ready for discharge.  CSW spoke to admissions director at SNF, they can accept patient tomorrow if he is medically ready, but can not accept him over the weekend.  CSW updated attending physician.  Expected Discharge Plan: Home/Self Care Barriers to Discharge: Continued Medical Work up  Expected Discharge Plan and Services Expected Discharge Plan: Home/Self Care   Discharge Planning Services: CM Consult   Living arrangements for the past 2 months: Apartment                                       Social Determinants of Health (SDOH) Interventions    Readmission Risk Interventions No flowsheet data found.

## 2021-01-20 NOTE — Progress Notes (Signed)
Progress Note  Patient Name: Anthony Petty Date of Encounter: 01/20/2021  Primary Cardiologist: Shelva Majestic, MD  Subjective   Patient seen with PPE. States "I feel quite good today!" Denies any CP. Breathing improved. Thinks he had maybe scant residual hemoptysis in tissue when coughing today.  Inpatient Medications    Scheduled Meds:  vitamin C  500 mg Oral Daily   aspirin EC  81 mg Oral Daily   carvedilol  3.125 mg Oral BID WC   Chlorhexidine Gluconate Cloth  6 each Topical Daily   clopidogrel  75 mg Oral Daily   hydrALAZINE  10 mg Oral Q8H   isosorbide mononitrate  30 mg Oral Daily   levothyroxine  50 mcg Oral Q0600   predniSONE  50 mg Oral Daily   zinc sulfate  220 mg Oral Daily   Continuous Infusions:  azithromycin Stopped (01/19/21 1750)   cefTRIAXone (ROCEPHIN)  IV Stopped (01/19/21 1645)   PRN Meds: acetaminophen **OR** acetaminophen, albuterol, chlorpheniramine-HYDROcodone, guaiFENesin-dextromethorphan, ondansetron **OR** ondansetron (ZOFRAN) IV   Vital Signs    Vitals:   01/19/21 2200 01/20/21 0500 01/20/21 0549 01/20/21 0815  BP: 112/64  122/71 121/79  Pulse: (!) 58  69 73  Resp: 19  12   Temp: 97.8 F (36.6 C)  98 F (36.7 C)   TempSrc: Oral  Oral   SpO2: 95%  95%   Weight:  63.6 kg    Height:        Intake/Output Summary (Last 24 hours) at 01/20/2021 0946 Last data filed at 01/20/2021 [id number] Gross per 24 hour  Intake 340 ml  Output 1175 ml  Net -835 ml   Last 3 Weights 01/20/2021 01/19/2021 01/18/2021  Weight (lbs) 140 lb 3.4 oz 131 lb 9.8 oz 139 lb 1.8 oz  Weight (kg) 63.6 kg 59.7 kg 63.1 kg     Telemetry    NSR occasional PACs - Personally Reviewed  Physical Exam   GEN: No acute distress, frail, elderly appearing WM HEENT: Normocephalic, atraumatic, sclera non-icteric. Neck: No JVD or bruits. Cardiac: RRR, 2/6 SEM heard at RUSB but also LSB. No rubs or gallops.  Respiratory: Diminished BS throughout bilaterally. Breathing is  unlabored. GI: Soft, nontender, non-distended, BS +x 4. MS: no deformity. Extremities: No clubbing or cyanosis. No edema. Distal pedal pulses are 2+ and equal bilaterally. Scattered senile purpura Neuro:  AAOx3. Follows commands. Psych:  Responds to questions appropriately with a normal affect.  Labs    High Sensitivity Troponin:   Recent Labs  Lab 01/16/21 1600 01/17/21 2042 01/18/21 0224 01/18/21 0928 01/18/21 1142  TROPONINIHS 640* 815* 899* 747* 727*      Cardiac EnzymesNo results for input(s): TROPONINI in the last 168 hours. No results for input(s): TROPIPOC in the last 168 hours.   Chemistry Recent Labs  Lab 01/18/21 0224 01/19/21 1054 01/20/21 0600  NA 135 139  139 139  K 4.6 3.2*  3.5 4.0  CL 105 102  102 104  CO2 '22 27  27 26  '$ GLUCOSE 121* 106*  107* 128*  BUN 47* 48*  49* 50*  CREATININE 1.59* 1.18  1.10 1.30*  CALCIUM 8.6* 9.1  9.1 8.9  PROT 5.9* 6.3*  6.3* 5.9*  ALBUMIN 2.5* 2.8*  2.8* 2.6*  AST '29 19  22 21  '$ ALT '14 20  19 22  '$ ALKPHOS 51 60  59 61  BILITOT 0.9 0.7  0.7 0.7  GFRNONAA 41* 58*  >60 52*  ANIONGAP 8 10  10  9     Hematology Recent Labs  Lab 01/18/21 0928 01/19/21 1054 01/20/21 0600  WBC 13.5* 10.5 11.5*  RBC 4.70 4.60 4.36  HGB 12.2* 11.9* 11.1*  HCT 37.2* 37.1* 34.5*  MCV 79.1* 80.7 79.1*  MCH 26.0 25.9* 25.5*  MCHC 32.8 32.1 32.2  RDW 15.4 15.6* 15.9*  PLT 339 368 376    BNP Recent Labs  Lab 01/16/21 1339  BNP 1,628.3*     DDimer  Recent Labs  Lab 01/18/21 0224 01/19/21 1054 01/20/21 0600  DDIMER 0.94* 1.31* 0.88*     Radiology    DG Chest Port 1 View  Result Date: 01/19/2021 CLINICAL DATA:  Respiratory failure, history of coronary artery disease post MI, CHF, colon cancer EXAM: PORTABLE CHEST 1 VIEW COMPARISON:  Portable exam 0447 hours compared to 01/16/2021 FINDINGS: Normal heart size and mediastinal contours. Diffuse BILATERAL pulmonary infiltrates again seen. Question small layering  pleural effusions bilaterally. No pneumothorax. Numerous osseous sclerotic foci consistent with osseous metastatic disease. IMPRESSION: Persistent BILATERAL pulmonary infiltrates consistent with history of COVID-19 pneumonia. Question small layering pleural effusions bilaterally. Diffuse sclerotic osseous metastatic disease. Electronically Signed   By: Lavonia Dana M.D.   On: 01/19/2021 09:48    Cardiac Studies   2D echo limited 01/17/21  1. Left ventricular ejection fraction, by estimation, is 30 to 35%. The  left ventricle has moderately decreased function. The left ventricle  demonstrates regional wall motion abnormalities (see scoring  diagram/findings for description). Left ventricular   diastolic parameters are consistent with Grade II diastolic dysfunction  (pseudonormalization). Elevated left atrial pressure.   2. Right ventricular systolic function is normal. The right ventricular  size is normal. There is mildly elevated pulmonary artery systolic  pressure. The estimated right ventricular systolic pressure is 123XX123 mmHg.   3. Left atrial size was moderately dilated.   4. The mitral valve is degenerative. Mild mitral valve regurgitation. No  evidence of mitral stenosis.   5. The aortic valve was not well visualized. Aortic valve regurgitation  is mild. Mild to moderate aortic valve stenosis. Vmax 2.5 m/s, MG 14 mmHg,  AVA 1.6 cm^2, DI 0.37   6. Aortic dilatation noted. There is mild dilatation of the aortic root,  measuring 41 mm.   7. The inferior vena cava is normal in size with greater than 50%  respiratory variability, suggesting right atrial pressure of 3 mmHg.   Comparison(s): Compared to prior echo [id number], LV systolic function is  reduced with new regional wall motion abnormalities.   Patient Profile     87 y.o. male with CAD s/p remote PCI, PAF (not on anticoagulation), chronic diastolic CHF, Raynaud's (on Norvasc), OSA, remote bacterial endocarditis in 1980s and in  1990s, suspected CKD stage III, colon cancer s/p colostomy and prostate cancer with bone metastases. Dx with Covid 01/11/21, presented back to the hospital 8/221/22 with acute hypoxic respiratory failure with diffuse infiltrates c/w Covid-19, also superimposed infection with elevated procalcitonin. Other issues include mild anemia. Cardiology consulted for NSTEMI and LV dysfunction.  Assessment & Plan    1. Acute hypoxic respiratory failure in context of Covid-19 PNA - also suspected CAP based on procalcitonin, hypoxia - still with occasional scant hemoptysis (hgb variable this admission but in 11 range presently) - further management per primary team  2. NSTEMI with prior history of CAD - medical therapy planned for now in light of infection issues, hemoptysis - will clarify plans for ischemic eval with MD (inpatient vs outpatient) but seems reasonable  to allow him time to recover from acute issues and revisit as OP - appears frail at baseline so need to weigh risk/benefit  - continue ASa, plavix, BB - will discuss statin with MD  3. Acute combined CHF (new LV dysfunction, prior diastolic CHF) - volume status looks OK - continue carvedilol, hydralazine '10mg'$  TID, Imdur '30mg'$  daily - avoid ACEi/ARB/ARNI/spiro with CKD but can re-eval as OP - etiology unclear - possibly ACS/progressive CAD vs stress-induced from acute infection issues - will need f/u of LV function as OP if clinically appropriate  4. Mild-moderate AS - anticipate clinical f/u as OP  5. Raynaud's disease - now off amlodipine due to LV dysfunction -> on nitrates  6. AKI on CKD stage IIIa - prior Cr 1.26 in 2019, admitted with Cr  1. 59, now at 1.30 which is similar to prior values  7. Elevated TSH - per IM  For questions or updates, please contact [place] Please consult [email or web address] for contact info under Cardiology/STEMI.  Signed, Charlie Pitter, PA-C 01/20/2021, 9:46 AM

## 2021-01-20 NOTE — Evaluation (Signed)
Occupational Therapy Evaluation Patient Details Name: Anthony Petty MRN: [id number] DOB: [birth year] Today's Date: 01/20/2021    History of Present Illness Anthony Petty is an 87 y.o. male PMH colon cancer status post colostomy, metastatic prostate cancer, Raynuad's, bacterial endocarditis  diagnosed with COVID August 16, To ED 01/16/21 in  acute hypoxic respirtory failure, requiring  nonrebreather, presentation felt to be predominantly from NSTEMI and acute systolic CHF complicated by mild COVID and possible pneumonia   Clinical Impression   [name] is an 87 year old man who presents with impaired balance and decreased activity tolerance resulting in a decline in safety and independence. Overall patient min guard for ambulation and ADLs. Expect patient to be able to return home with wife at discharge without OT services. Will follow acutely to maintain and/or improve functional abilities while in hospital.    Follow Up Recommendations  No OT follow up    Equipment Recommendations  None recommended by OT    Recommendations for Other Services       Precautions / Restrictions Precautions Precautions: Fall Precaution Comments: colostomy Restrictions Weight Bearing Restrictions: No      Mobility Bed Mobility Overal bed mobility: Needs Assistance Bed Mobility: Supine to Sit     Supine to sit: Supervision          Transfers Overall transfer level: Needs assistance Equipment used: Rolling walker (2 wheeled) Transfers: Sit to/from Stand           General transfer comment: Initially did not use RW and patient held on to walker with one loss of balance. With time and use of RW patient improved to overall min guard for ambulation.    Balance Overall balance assessment: Mild deficits observed, not formally tested                                         ADL either performed or assessed with clinical judgement   ADL Overall ADL's :  Needs assistance/impaired Eating/Feeding: Independent   Grooming: Min guard;Standing   Upper Body Bathing: Set up   Lower Body Bathing: Set up   Upper Body Dressing : Set up   Lower Body Dressing: Set up;Min guard   Toilet Transfer: Min guard   Toileting- Clothing Manipulation and Hygiene: Min guard               Vision Patient Visual Report: No change from baseline       Perception     Praxis      Pertinent Vitals/Pain Pain Assessment: No/denies pain     Hand Dominance Right   Extremity/Trunk Assessment Upper Extremity Assessment Upper Extremity Assessment: Overall WFL for tasks assessed   Lower Extremity Assessment Lower Extremity Assessment: Overall WFL for tasks assessed   Cervical / Trunk Assessment Cervical / Trunk Assessment: Kyphotic   Communication Communication Communication: HOH   Cognition Arousal/Alertness: Awake/alert Behavior During Therapy: WFL for tasks assessed/performed Overall Cognitive Status: Within Functional Limits for tasks assessed                                 General Comments: Functional though appears to have some short term memory deficits.   General Comments       Exercises     Shoulder Instructions      Home Living Family/patient expects to be discharged to::  Assisted living                             Home Equipment: [place] - 2 wheels   Additional Comments: Has a walker but doesn't typically use it.      Prior Functioning/Environment Level of Independence: Independent        Comments: Lives with wife at [facility].        OT Problem List: Decreased activity tolerance;Decreased knowledge of use of DME or AE;Impaired balance (sitting and/or standing)      OT Treatment/Interventions: Self-care/ADL training;DME and/or AE instruction;Therapeutic activities;Balance training;Patient/family education    OT Goals(Current goals can be found in the care plan section) Acute  Rehab OT Goals Patient Stated Goal: to go home OT Goal Formulation: With patient Time For Goal Achievement: 02/03/21 Potential to Achieve Goals: Good  OT Frequency: Min 2X/week   Barriers to D/C:            Co-evaluation              AM-PAC OT "6 Clicks" Daily Activity     Outcome Measure Help from another person eating meals?: None Help from another person taking care of personal grooming?: A Little Help from another person toileting, which includes using toliet, bedpan, or urinal?: A Little Help from another person bathing (including washing, rinsing, drying)?: A Little Help from another person to put on and taking off regular upper body clothing?: A Little Help from another person to put on and taking off regular lower body clothing?: A Little 6 Click Score: 19   End of Session Equipment Utilized During Treatment: Rolling walker Nurse Communication: Mobility status  Activity Tolerance: Patient tolerated treatment well Patient left: in chair;with call bell/phone within reach;with chair alarm set  OT Visit Diagnosis: Unsteadiness on feet (R26.81)                Time: [id number] OT Time Calculation (min): 37 min Charges:  OT General Charges $OT Visit: 1 Visit OT Evaluation $OT Eval Low Complexity: 1 Low OT Treatments $Self Care/Home Management : 8-22 mins  Shawnita Krizek, OTR/L [place]  Office [phone] Pager: [phone]   Lenward Chancellor 01/20/2021, 1:48 PM

## 2021-01-21 ENCOUNTER — Other Ambulatory Visit: Payer: Self-pay | Admitting: Physician Assistant

## 2021-01-21 DIAGNOSIS — U071 COVID-19: Secondary | ICD-10-CM | POA: Diagnosis not present

## 2021-01-21 DIAGNOSIS — I429 Cardiomyopathy, unspecified: Secondary | ICD-10-CM

## 2021-01-21 DIAGNOSIS — J1282 Pneumonia due to coronavirus disease 2019: Secondary | ICD-10-CM | POA: Diagnosis not present

## 2021-01-21 DIAGNOSIS — I214 Non-ST elevation (NSTEMI) myocardial infarction: Secondary | ICD-10-CM

## 2021-01-21 DIAGNOSIS — I5041 Acute combined systolic (congestive) and diastolic (congestive) heart failure: Secondary | ICD-10-CM

## 2021-01-21 LAB — COMPREHENSIVE METABOLIC PANEL
ALT: 21 U/L (ref 0–44)
AST: 23 U/L (ref 15–41)
Albumin: 2.4 g/dL — ABNORMAL LOW (ref 3.5–5.0)
Alkaline Phosphatase: 55 U/L (ref 38–126)
Anion gap: 9 (ref 5–15)
BUN: 51 mg/dL — ABNORMAL HIGH (ref 8–23)
CO2: 20 mmol/L — ABNORMAL LOW (ref 22–32)
Calcium: 8.5 mg/dL — ABNORMAL LOW (ref 8.9–10.3)
Chloride: 107 mmol/L (ref 98–111)
Creatinine, Ser: 1.16 mg/dL (ref 0.61–1.24)
GFR, Estimated: 59 mL/min — ABNORMAL LOW (ref >60)
Glucose, Bld: 114 mg/dL — ABNORMAL HIGH (ref 70–99)
Potassium: 4 mmol/L (ref 3.5–5.1)
Sodium: 136 mmol/L (ref 135–145)
Total Bilirubin: 0.8 mg/dL (ref 0.3–1.2)
Total Protein: 5.3 g/dL — ABNORMAL LOW (ref 6.5–8.1)

## 2021-01-21 MED ORDER — ZINC SULFATE 220 (50 ZN) MG PO CAPS: 220.0000 mg | ORAL_CAPSULE | Freq: Every day | ORAL | Status: DC

## 2021-01-21 MED ORDER — PREDNISONE 50 MG PO TABS: ORAL_TABLET | ORAL | Status: AC

## 2021-01-21 MED ORDER — ASCORBIC ACID 500 MG PO TABS: 500.0000 mg | ORAL_TABLET | Freq: Every day | ORAL | Status: DC

## 2021-01-21 MED ORDER — HYDRALAZINE HCL 10 MG PO TABS: 10.0000 mg | ORAL_TABLET | Freq: Three times a day (TID) | ORAL | Status: DC

## 2021-01-21 MED ORDER — CARVEDILOL 3.125 MG PO TABS: 3.1250 mg | ORAL_TABLET | Freq: Two times a day (BID) | ORAL | Status: DC

## 2021-01-21 NOTE — Care Management Important Message (Signed)
Medicare IM printed for [place] [facility] to give to the patient. 

## 2021-01-21 NOTE — Progress Notes (Addendum)
Dr. Radford Pax requested patient appt with Dr. [name] or PA in 1 week (has to be seen and if no appt then need to make room on DOD), 2D echo in 4 weeks (ok to be limited). Patient has appt with Dr [name] already on 9/1. Have arranged limited echo on 02/21/21 @ [address] [facility] office. Appt info placed on AVS. Confirmed with nurse not printed yet. Call with questions.  Addendum: received page by nurse that patient had episode of HR 40s-50s, sinus bradycardia while sleeping, BP 1teens/60s and similar to prior, asymptomatic. HR has otherwise been normal during daytime hours. Our team is now stationed @ [facility] so unable to personally review telemetry so relayed request to Dr. Eliseo Squires to review telemetry/patient. She felt this was likely due to sleep apnea as he has been refusing CPAP, otherwise reports no concerning findings and still plans dc today. Baseline HR known to be 50s-70s. Zeriyah Wain PA-C

## 2021-01-21 NOTE — TOC Progression Note (Signed)
Transition of Care [facility]) - Progression Note    Patient Details  Name: Breydan Cova MRN: [id number] Date of Birth: 12/19/1927  [facility] CM/SW Contact  Purcell Mouton, RN Phone Number: 01/21/2021, 4:12 PM  Clinical Narrative:    Corey Harold ws called. Spoke with Draw AC at [facility] to make her aware that pt will arrive late.    Expected Discharge Plan: Home/Self Care Barriers to Discharge: Continued Medical Work up  Expected Discharge Plan and Services Expected Discharge Plan: Home/Self Care   Discharge Planning Services: CM Consult   Living arrangements for the past 2 months: Apartment Expected Discharge Date: 01/21/21                                     Social Determinants of Health (SDOH) Interventions    Readmission Risk Interventions No flowsheet data found.

## 2021-01-21 NOTE — Discharge Summary (Addendum)
Physician Discharge Summary  Anthony Petty [id number] DOB: Oct 16, 1927 DOA: 01/16/2021  PCP: Javier Glazier, MD  Admit date: 01/16/2021 Discharge date: 01/21/2021  Admitted From: home Discharge disposition: SNF   Recommendations for Outpatient Follow-Up:   Wound care: see below BMP 1 week Foley care Repeat echo 4 weeks   Discharge Diagnosis:   Principal Problem:   Pneumonia due to COVID-19 virus Active Problems:   CAD S/P percutaneous coronary angioplasty   NSTEMI (non-ST elevated myocardial infarction) ([place])   Acute hypoxemic respiratory failure due to COVID-19 ([place])   S/P colostomy ([place])   Benign prostatic hyperplasia with urinary obstruction   DNR (do not resuscitate)/DNI(Do Not Intubate)   Acute systolic CHF (congestive heart failure) ([place])   CAP (community acquired pneumonia)    Discharge Condition: Improved.  Diet recommendation:  Regular.  Wound care: Wound care to Unstageable pressure injury to the coccyx: Cleanse with NS, pat dry., cover with size appropriate piece of xeroform gauze Anthony Petty # [id number]), top with dry gauze 2x2 and secure with silicone foam dressing. Turn side to side and minimize time in the supine position  Code status: DNR   History of Present Illness:   87 year old male with a prior history of colon cancer status post colostomy, coronary disease, hypertension, BPH presents to the ER today with worsening shortness of breath.  He was diagnosed with COVID on August 16.  He was given a Medrol Dosepak and doxycycline.  He returns to the ER today with continued shortness of breath.  He was in respiratory extremis.  Patient initially started out at 2 L/min nasal cannula oxygen and quickly escalated to 15 L with nonrebreather mask.  Patient in respiratory stress and cannot give any history review of systems.  Patient did present to the ER with a portable DNR.  He does verify that he is a DNR.  He states that he would not want to be  intubated for respiratory distress or respiratory failure.   Evaluation the ER.  His COVID test was positive on the 16th.  Chest x-ray demonstrates widespread pulmonary infiltrates.  He also may have bilateral pleural effusions.  BNP was elevated at 1628.  CRP elevated 2.9.  Ferritin normal at 111.  LDH elevated 206.  Procalcitonin level elevated at 10.4.    Hospital Course by Problem:   Pneumonia due to COVID-19 virus -- much improved -finished abx -finish course of steroids   Acute systolic CHF (congestive heart failure) (HCC) -- New diagnosis, newly depressed EF secondary to NSTEMI -- management as per cardiology: would treat conservatively with antianginal therapy including Carvedilol, Imdur, ASA and Plavix as well as Hydralazine for his LV dysfunction.  If this is Takotsubo then his LVF should improve in the next few week.  Will repeat 2D echo in 4 weeks.  I will get him in to see Dr. [name] or his PA in 1 week.     NSTEMI (non-ST elevated myocardial infarction) ([name]) -- In retrospect, this is likely predominant reason for his presentation -- Asymptomatic.  Given recurrent hemoptysis, heparin stopped.  Continue aspirin, carvedilol, hydralazine, Imdur -cards consult appreciated    CAP (community acquired pneumonia) --suspected based on procalcitonin, hypoxia --finish abx   Acute hypoxemic respiratory failure due to COVID-19 [facility]) -- Multifactorial, COVID, possible bacterial pneumonia, acute systolic heart failure --Continue antibiotics, COVID treatment, treatment directed CHF -has been weaned to room air   Benign prostatic hyperplasia with urinary obstruction --has outpt foley   CAD S/P percutaneous  coronary angioplasty --Asymptomatic.  Continue ASA, coreg and imdur.   Benign essential hypertension Stable.   S/P colostomy (HCC) Chronic.   Elevated troponin -- May be demand ischemia given severe respiratory distress on admission, however given rapid clinical  improvement, concern for new cardiac dysfunction -- echo: Left ventricular ejection fraction, by estimation, is 30 to 35%. The  left ventricle has moderately decreased function. The left ventricle  demonstrates regional wall motion abnormalities (see scoring  diagram/findings for description). Left ventricular   diastolic parameters are consistent with Grade II diastolic dysfunction  (pseudonormalization).        Skin Assessment: I have examined the patient's skin and I agree with the wound assessment as performed by the wound care RN as outlined below: Pressure Injury 01/16/21 Coccyx Medial Unstageable - Full thickness tissue loss in which the base of the injury is covered by slough (yellow, tan, gray, green or brown) and/or eschar (tan, brown or black) in the wound bed. (Active)  01/16/21 1914  Location: Coccyx  Location Orientation: Medial  Staging: Unstageable - Full thickness tissue loss in which the base of the injury is covered by slough (yellow, tan, gray, green or brown) and/or eschar (tan, brown or black) in the wound bed.  Wound Description (Comments):   Present on Admission: Yes        Medical Consultants:   Cards PCCM    Discharge Exam:   Vitals:   01/20/21 2130 01/21/21 0557  BP:  113/65  Pulse:  66  Resp: 14 20  Temp: 98.1 F (36.7 C) 97.8 F (36.6 C)  SpO2:  98%   Vitals:   01/20/21 1953 01/20/21 2130 01/21/21 0500 01/21/21 0557  BP: (!) 101/52   113/65  Pulse: 64   66  Resp: '19 14  20  '$ Temp:  98.1 F (36.7 C)  97.8 F (36.6 C)  TempSrc:  Oral  Oral  SpO2: 98%   98%  Weight:   63.7 kg   Height:        General exam: Appears calm and comfortable.    The results of significant diagnostics from this hospitalization (including imaging, microbiology, ancillary and laboratory) are listed below for reference.     Procedures and Diagnostic Studies:   DG Chest Port 1 View  Result Date: 01/16/2021 CLINICAL DATA:  Increasing shortness of breath.  History of colon carcinoma. Tested positive for COVID-19 3 days ago. EXAM: PORTABLE CHEST 1 VIEW COMPARISON:  08/23/2017 FINDINGS: Bilateral airspace lung opacities.  Possible small effusions. No pneumothorax. Cardiac silhouette normal in size.  No mediastinal or hilar masses. There are multiple areas of sclerosis in the visualized skeleton involving the clavicles, scapula, proximal right humerus, vertebra and ribs. These are new from the prior study. IMPRESSION: 1. Bilateral airspace lung opacities consistent with multifocal pneumonia given the history of testing positive for COVID-19. 2. Multiple bilateral sclerotic bone lesions consistent with metastatic disease. Patient's history reports colon carcinoma in 1987. The appearance of these lesions is more suggestive of metastatic prostate carcinoma. Electronically Signed   By: Lajean Manes M.D.   On: 01/16/2021 14:03   VAS [name] LOWER EXTREMITY VENOUS (DVT)  Result Date: 01/19/2021  Lower Venous DVT Study Patient Name:  SIRAJ HUTCHINGS  Date of Exam:   01/17/2021 Medical Rec #: [id number]          Accession #:    [id number] Date of Birth: [birth year]          Patient Gender: M Patient Age:  89 years Exam Location:  [facility] Procedure:      VAS [name] LOWER EXTREMITY VENOUS (DVT) Referring Phys: MURALI RAMASWAMY --------------------------------------------------------------------------------  Indications: Edema, and COVID.  Comparison Study: No prior study Performing Technologist: Maudry Mayhew MHA, RDMS, RVT, RDCS  Examination Guidelines: A complete evaluation includes B-mode imaging, spectral Doppler, color Doppler, and power Doppler as needed of all accessible portions of each vessel. Bilateral testing is considered an integral part of a complete examination. Limited examinations for reoccurring indications may be performed as noted. The reflux portion of the exam is performed with the patient in reverse Trendelenburg.   +---------+---------------+---------+-----------+----------+--------------+ RIGHT    CompressibilityPhasicitySpontaneityPropertiesThrombus Aging +---------+---------------+---------+-----------+----------+--------------+ CFV      Full           Yes      Yes                                 +---------+---------------+---------+-----------+----------+--------------+ SFJ      Full                                                        +---------+---------------+---------+-----------+----------+--------------+ FV Prox  Full                                                        +---------+---------------+---------+-----------+----------+--------------+ FV Mid   Full                                                        +---------+---------------+---------+-----------+----------+--------------+ FV DistalFull                                                        +---------+---------------+---------+-----------+----------+--------------+ PFV      Full                                                        +---------+---------------+---------+-----------+----------+--------------+ POP      Full           Yes      Yes                                 +---------+---------------+---------+-----------+----------+--------------+ PTV      Full                                                        +---------+---------------+---------+-----------+----------+--------------+ PERO  Full                                                        +---------+---------------+---------+-----------+----------+--------------+   +---------+---------------+---------+-----------+----------+--------------+ LEFT     CompressibilityPhasicitySpontaneityPropertiesThrombus Aging +---------+---------------+---------+-----------+----------+--------------+ CFV      Full           Yes      Yes                                  +---------+---------------+---------+-----------+----------+--------------+ SFJ      Full                                                        +---------+---------------+---------+-----------+----------+--------------+ FV Prox  Full                                                        +---------+---------------+---------+-----------+----------+--------------+ FV Mid   Full                                                        +---------+---------------+---------+-----------+----------+--------------+ FV DistalFull                                                        +---------+---------------+---------+-----------+----------+--------------+ PFV      Full                                                        +---------+---------------+---------+-----------+----------+--------------+ POP      Full           Yes      Yes                                 +---------+---------------+---------+-----------+----------+--------------+ PTV      Full                                                        +---------+---------------+---------+-----------+----------+--------------+ PERO     Full                                                        +---------+---------------+---------+-----------+----------+--------------+  Summary: RIGHT: - There is no evidence of deep vein thrombosis in the lower extremity.  - No cystic structure found in the popliteal fossa.  LEFT: - There is no evidence of deep vein thrombosis in the lower extremity.  - No cystic structure found in the popliteal fossa.  *See table(s) above for measurements and observations. Electronically signed by Harold Barban MD on 01/19/2021 at 8:09:58 PM.    Final    ECHOCARDIOGRAM LIMITED  Result Date: 01/17/2021    ECHOCARDIOGRAM LIMITED REPORT   Patient Name:   HAMID HASER Date of Exam: 01/17/2021 Medical Rec #:  [id number]         Height:       67.0 in Accession #:    [id number]        Weight:        138.0 lb Date of Birth:  1927-06-03         BSA:          1.727 m Patient Age:    71 years          BP:           109/64 mmHg Patient Gender: M                 HR:           69 bpm. Exam Location:  Inpatient Procedure: Limited Echo, Limited Color Doppler, Cardiac Doppler and Intracardiac            Opacification Agent  Results communicated to Dr Sarajane Jews at 17:18 on 01/17/21. Indications:     AV and EF check only                  SOB and Covid  History:         Patient has prior history of Echocardiogram examinations, most                  recent 11/11/2020. CAD and Previous Myocardial Infarction,                  Arrythmias:Bradycardia, Signs/Symptoms:Murmur; Risk                  Factors:Dyslipidemia and Hypertension.  Sonographer:     Luisa Hart RDCS Referring Phys:  [place] Diagnosing Phys: Oswaldo Milian MD  Sonographer Comments: Technically challenging study due to limited acoustic windows. IMPRESSIONS  1. Left ventricular ejection fraction, by estimation, is 30 to 35%. The left ventricle has moderately decreased function. The left ventricle demonstrates regional wall motion abnormalities (see scoring diagram/findings for description). Left ventricular  diastolic parameters are consistent with Grade II diastolic dysfunction (pseudonormalization). Elevated left atrial pressure.  2. Right ventricular systolic function is normal. The right ventricular size is normal. There is mildly elevated pulmonary artery systolic pressure. The estimated right ventricular systolic pressure is 123XX123 mmHg.  3. Left atrial size was moderately dilated.  4. The mitral valve is degenerative. Mild mitral valve regurgitation. No evidence of mitral stenosis.  5. The aortic valve was not well visualized. Aortic valve regurgitation is mild. Mild to moderate aortic valve stenosis. Vmax 2.5 m/s, MG 14 mmHg, AVA 1.6 cm^2, DI 0.37  6. Aortic dilatation noted. There is mild dilatation of the aortic root, measuring 41  mm.  7. The inferior vena cava is normal in size with greater than 50% respiratory variability, suggesting right atrial pressure of 3 mmHg. Comparison(s): Compared to prior echo [id number], LV systolic function is reduced with  new regional wall motion abnormalities. FINDINGS  Left Ventricle: Left ventricular ejection fraction, by estimation, is 30 to 35%. The left ventricle has moderately decreased function. The left ventricle demonstrates regional wall motion abnormalities. Definity contrast agent was given IV to delineate the left ventricular endocardial borders. Left ventricular diastolic parameters are consistent with Grade II diastolic dysfunction (pseudonormalization). Elevated left atrial pressure.  LV Wall Scoring: The mid and distal anterior septum, entire apex, and mid inferoseptal segment are akinetic. The mid anterior segment and mid inferior segment are hypokinetic. The antero-lateral wall, posterior wall, basal anteroseptal segment, basal anterior segment, basal inferior segment, and basal inferoseptal segment are normal. Right Ventricle: The right ventricular size is normal. No increase in right ventricular wall thickness. Right ventricular systolic function is normal. There is mildly elevated pulmonary artery systolic pressure. The tricuspid regurgitant velocity is 3.20  m/s, and with an assumed right atrial pressure of 3 mmHg, the estimated right ventricular systolic pressure is 123XX123 mmHg. Left Atrium: Left atrial size was moderately dilated. Right Atrium: Right atrial size was normal in size. Pericardium: Trivial pericardial effusion is present. Mitral Valve: The mitral valve is degenerative in appearance. Mild mitral valve regurgitation. No evidence of mitral valve stenosis. MV peak gradient, 5.2 mmHg. The mean mitral valve gradient is 2.0 mmHg. Aortic Valve: The aortic valve was not well visualized. Aortic valve regurgitation is mild. Aortic regurgitation PHT measures 516 msec. Mild to moderate  aortic stenosis is present. Aortic valve mean gradient measures 13.0 mmHg. Aortic valve peak gradient  measures 24.1 mmHg. Aortic valve area, by VTI measures 1.63 cm. Aorta: The aortic root is normal in size and structure and aortic dilatation noted. There is mild dilatation of the aortic root, measuring 41 mm. Venous: The inferior vena cava is normal in size with greater than 50% respiratory variability, suggesting right atrial pressure of 3 mmHg. LEFT VENTRICLE PLAX 2D LV IVS:        0.90 cm  Diastology LVOT diam:     2.40 cm  LV e' medial:    3.92 cm/s LV SV:         85       LV E/e' medial:  31.4 LV SV Index:   49       LV e' lateral:   6.31 cm/s LVOT Area:     4.52 cm LV E/e' lateral: 19.5  LEFT ATRIUM             Index       RIGHT ATRIUM           Index LA diam:        4.00 cm 2.32 cm/m  RA Area:     13.30 cm LA Vol (A2C):   84.9 ml 49.15 ml/m RA Volume:   29.80 ml  17.25 ml/m LA Vol (A4C):   68.3 ml 39.54 ml/m LA Biplane Vol: 76.5 ml 44.29 ml/m  AORTIC VALVE AV Area (Vmax):    1.59 cm AV Area (Vmean):   1.52 cm AV Area (VTI):     1.63 cm AV Vmax:           245.50 cm/s AV Vmean:          165.000 cm/s AV VTI:            0.518 m AV Peak Grad:      24.1 mmHg AV Mean Grad:      13.0 mmHg LVOT Vmax:         86.40 cm/s LVOT  Vmean:        55.400 cm/s LVOT VTI:          0.187 m LVOT/AV VTI ratio: 0.36 AI PHT:            516 msec  AORTA Ao Root diam: 4.10 cm MITRAL VALVE                TRICUSPID VALVE MV Area (PHT): 5.54 cm     TR Peak grad:   41.0 mmHg MV Area VTI:   2.28 cm     TR Vmax:        320.00 cm/s MV Peak grad:  5.2 mmHg MV Mean grad:  2.0 mmHg     SHUNTS MV Vmax:       1.14 m/s     Systemic VTI:  0.19 m MV Vmean:      68.5 cm/s    Systemic Diam: 2.40 cm MV Decel Time: 137 msec MR Peak grad: 78.1 mmHg MR Vmax:      442.00 cm/s MV E velocity: 123.00 cm/s MV A velocity: 64.50 cm/s MV E/A ratio:  1.91 Oswaldo Milian MD Electronically signed by Oswaldo Milian MD Signature Date/Time:  01/17/2021/5:17:49 PM    Final (Updated)      Labs:   Basic Metabolic Panel: Recent Labs  Lab 01/16/21 1339 01/16/21 1902 01/16/21 2141 01/18/21 0224 01/19/21 1054 01/20/21 0600  NA 130*  --  130* 135 139  139 139  K 4.4  --  4.7 4.6 3.2*  3.5 4.0  CL 100  --  101 105 102  102 104  CO2 23  --  20* '22 27  27 26  '$ GLUCOSE 112*  --  144* 121* 106*  107* 128*  BUN 41*  --  41* 47* 48*  49* 50*  CREATININE 1.54* 1.55* 1.56* 1.59* 1.18  1.10 1.30*  CALCIUM 8.8*  --  8.4* 8.6* 9.1  9.1 8.9  MG  --   --  2.4 2.2  --   --   PHOS  --   --   --  3.9 3.0 3.4   GFR Estimated Creatinine Clearance: 33.3 mL/min (A) (by C-G formula based on SCr of 1.3 mg/dL (H)). Liver Function Tests: Recent Labs  Lab 01/16/21 1339 01/18/21 0224 01/19/21 1054 01/20/21 0600  AST '28 29 19  22 21  '$ ALT '21 14 20  19 22  '$ ALKPHOS 66 51 60  59 61  BILITOT 0.7 0.9 0.7  0.7 0.7  PROT 6.7 5.9* 6.3*  6.3* 5.9*  ALBUMIN 2.8* 2.5* 2.8*  2.8* 2.6*   No results for input(s): LIPASE, AMYLASE in the last 168 hours. No results for input(s): AMMONIA in the last 168 hours. Coagulation profile No results for input(s): INR, PROTIME in the last 168 hours.  CBC: Recent Labs  Lab 01/17/21 0258 01/18/21 0224 01/18/21 0928 01/19/21 1054 01/20/21 0600  WBC 8.0 11.6* 13.5* 10.5 11.5*  NEUTROABS 5.5 8.2*  --  7.4 8.0*  HGB 9.7* 9.9* 12.2* 11.9* 11.1*  HCT 31.0* 30.8* 37.2* 37.1* 34.5*  MCV 81.6 79.6* 79.1* 80.7 79.1*  PLT 279 369 339 368 376   Cardiac Enzymes: No results for input(s): CKTOTAL, CKMB, CKMBINDEX, TROPONINI in the last 168 hours. BNP: Invalid input(s): POCBNP CBG: No results for input(s): GLUCAP in the last 168 hours. D-Dimer Recent Labs    01/19/21 1054 01/20/21 0600  DDIMER 1.31* 0.88*   Hgb A1c No results for input(s): HGBA1C in the last 72 hours.  Lipid Profile No results for input(s): CHOL, HDL, LDLCALC, TRIG, CHOLHDL, LDLDIRECT in the last 72 hours. Thyroid function  studies Recent Labs    01/19/21 1054  TSH 4.636*   Anemia work up No results for input(s): VITAMINB12, FOLATE, FERRITIN, TIBC, IRON, RETICCTPCT in the last 72 hours. Microbiology Recent Results (from the past 240 hour(s))  MRSA Next Gen by PCR, Nasal     Status: None   Collection Time: 01/16/21  6:08 PM   Specimen: Nasal Mucosa; Nasal Swab  Result Value Ref Range Status   MRSA by PCR Next Gen NOT DETECTED NOT DETECTED Final    Comment: (NOTE) The GeneXpert MRSA Assay (FDA approved for NASAL specimens only), is one component of a comprehensive MRSA colonization surveillance program. It is not intended to diagnose MRSA infection nor to guide or monitor treatment for MRSA infections. Test performance is not FDA approved in patients less than 52 years old. Performed at [facility], [place] [address], [place], [place] [address]      Discharge Instructions:   Discharge Instructions     Diet general   Complete by: As directed    Discharge wound care:   Complete by: As directed    Wound care to Unstageable pressure injury to the coccyx: Cleanse with NS, pat dry., cover with size appropriate piece of xeroform gauze Anthony Petty # 294), top with dry gauze 2x2 and secure with silicone foam dressing. Turn side to side and minimize time in the supine position   Increase activity slowly   Complete by: As directed       Allergies as of 01/21/2021   No Known Allergies      Medication List     STOP taking these medications    amLODipine 5 MG tablet Commonly known as: NORVASC   co-enzyme Q-10 30 MG capsule   doxycycline 100 MG capsule Commonly known as: VIBRAMYCIN   nitroGLYCERIN 0.4 MG SL tablet Commonly known as: NITROSTAT   saw palmetto 160 MG capsule   Turmeric Curcumin Caps   Vytorin 10-20 MG tablet Generic drug: ezetimibe-simvastatin       TAKE these medications    ascorbic acid 500 MG tablet Commonly known as: VITAMIN C Take 1 tablet (500 mg  total) by mouth daily.   aspirin 81 MG tablet Take 81 mg by mouth daily.   carvedilol 3.125 MG tablet Commonly known as: COREG Take 1 tablet (3.125 mg total) by mouth 2 (two) times daily with a meal.   clopidogrel 75 MG tablet Commonly known as: PLAVIX TAKE 1 TABLET BY MOUTH DAILY   ferrous sulfate 325 (65 FE) MG tablet Take 325 mg by mouth daily.   fish oil-omega-3 fatty acids 1000 MG capsule Take 1,000 mg by mouth daily.   hydrALAZINE 10 MG tablet Commonly known as: APRESOLINE Take 1 tablet (10 mg total) by mouth every 8 (eight) hours.   isosorbide mononitrate 30 MG 24 hr tablet Commonly known as: IMDUR TAKE ONE (1) TABLET EACH DAY What changed:  how much to take how to take this when to take this additional instructions   levothyroxine 50 MCG tablet Commonly known as: SYNTHROID Take 50 mcg by mouth daily.   magnesium oxide 400 (240 Mg) MG tablet Commonly known as: MAG-OX Take 800 mg by mouth at bedtime.   NON FORMULARY CPAP therapy   predniSONE 50 MG tablet Commonly known as: DELTASONE 50 mg daily through monday   Vitamin D3 10 MCG (400 UNIT) Caps Take 400  Units by mouth daily.   zinc sulfate 220 (50 Zn) MG capsule Take 1 capsule (220 mg total) by mouth daily.               Discharge Care Instructions  (From admission, onward)           Start     Ordered   01/21/21 0000  Discharge wound care:       Comments: Wound care to Unstageable pressure injury to the coccyx: Cleanse with NS, pat dry., cover with size appropriate piece of xeroform gauze Anthony Petty # 294), top with dry gauze 2x2 and secure with silicone foam dressing. Turn side to side and minimize time in the supine position   01/21/21 0752              Time coordinating discharge: 35 min  Signed:  Geradine Girt DO  [facility] 01/21/2021, 7:52 AM

## 2021-01-21 NOTE — Progress Notes (Signed)
VSS. [facility]AR to transport pt to [facility]. Pt transported w/ appropriate paperwork and belongings.

## 2021-01-27 ENCOUNTER — Ambulatory Visit: Payer: Medicare Other | Admitting: Cardiovascular Disease

## 2021-02-21 ENCOUNTER — Telehealth (HOSPITAL_COMMUNITY): Payer: Self-pay | Admitting: Cardiology

## 2021-02-21 ENCOUNTER — Other Ambulatory Visit (HOSPITAL_COMMUNITY): Payer: Medicare Other

## 2021-02-21 NOTE — Telephone Encounter (Signed)
Patient called and cancelled echocardiogram for the reason below:  02/21/2021 8:43 AM NO:IBBCWU, Anthony Petty  Cancel Rsn: Patient (patient was sick, will call to reschedule)  Order will be removed from the active echo wq and if pt calls back to reschedule we will reinstate the order. Thank you

## 2021-02-22 ENCOUNTER — Other Ambulatory Visit: Payer: Self-pay

## 2021-02-22 DIAGNOSIS — I214 Non-ST elevation (NSTEMI) myocardial infarction: Secondary | ICD-10-CM

## 2021-02-22 DIAGNOSIS — I429 Cardiomyopathy, unspecified: Secondary | ICD-10-CM

## 2021-02-22 DIAGNOSIS — I5041 Acute combined systolic (congestive) and diastolic (congestive) heart failure: Secondary | ICD-10-CM

## 2021-02-28 ENCOUNTER — Telehealth (HOSPITAL_COMMUNITY): Payer: Self-pay | Admitting: Physician Assistant

## 2021-02-28 NOTE — Telephone Encounter (Signed)
Patient echocardiogram was cancelled for the reason below:  02/25/2021 2:25 PM LW:HKNZU, SELENA  Cancel Rsn: Patient (with hospice care per Lattie Haw from [place])  Order will be removed from the Echo WQ.

## 2021-03-08 ENCOUNTER — Other Ambulatory Visit (HOSPITAL_COMMUNITY): Payer: Medicare Other

## 2021-03-21 ENCOUNTER — Ambulatory Visit: Payer: Medicare Other | Admitting: Cardiovascular Disease

## 2021-03-29 DEATH — deceased
